# Patient Record
Sex: Female | Born: 1950
Health system: Southern US, Community
[De-identification: ages and names within clinical notes are randomized; demographics above are authoritative.]

## PROBLEM LIST (undated history)

## (undated) DIAGNOSIS — E785 Hyperlipidemia, unspecified: Secondary | ICD-10-CM

## (undated) DIAGNOSIS — D170 Benign lipomatous neoplasm of skin and subcutaneous tissue of head, face and neck: Secondary | ICD-10-CM

## (undated) DIAGNOSIS — K760 Fatty (change of) liver, not elsewhere classified: Secondary | ICD-10-CM

## (undated) DIAGNOSIS — E039 Hypothyroidism, unspecified: Secondary | ICD-10-CM

## (undated) DIAGNOSIS — K649 Unspecified hemorrhoids: Secondary | ICD-10-CM

## (undated) DIAGNOSIS — I10 Essential (primary) hypertension: Secondary | ICD-10-CM

## (undated) DIAGNOSIS — E119 Type 2 diabetes mellitus without complications: Secondary | ICD-10-CM

## (undated) HISTORY — PX: APPENDECTOMY: SHX54

## (undated) HISTORY — DX: Unspecified hemorrhoids: K64.9

## (undated) HISTORY — DX: Hypothyroidism, unspecified: E03.9

## (undated) HISTORY — DX: Benign lipomatous neoplasm of skin and subcutaneous tissue of head, face and neck: D17.0

## (undated) HISTORY — DX: Type 2 diabetes mellitus without complications: E11.9

## (undated) HISTORY — DX: Fatty (change of) liver, not elsewhere classified: K76.0

## (undated) HISTORY — DX: Hyperlipidemia, unspecified: E78.5

## (undated) HISTORY — PX: ABDOMINAL HYSTERECTOMY: SHX81

## (undated) HISTORY — PX: DILATION AND CURETTAGE OF UTERUS: SHX78

---

## 2001-02-13 ENCOUNTER — Other Ambulatory Visit: Admission: RE | Admit: 2001-02-13 | Discharge: 2001-02-13 | Payer: Self-pay | Admitting: Obstetrics and Gynecology

## 2004-04-05 ENCOUNTER — Ambulatory Visit (HOSPITAL_COMMUNITY): Admission: RE | Admit: 2004-04-05 | Discharge: 2004-04-05 | Payer: Self-pay | Admitting: Obstetrics and Gynecology

## 2005-05-07 ENCOUNTER — Ambulatory Visit (HOSPITAL_COMMUNITY): Admission: RE | Admit: 2005-05-07 | Discharge: 2005-05-07 | Payer: Self-pay | Admitting: Obstetrics and Gynecology

## 2005-05-30 ENCOUNTER — Ambulatory Visit (HOSPITAL_COMMUNITY): Admission: RE | Admit: 2005-05-30 | Discharge: 2005-05-30 | Payer: Self-pay | Admitting: Internal Medicine

## 2005-05-30 ENCOUNTER — Ambulatory Visit: Payer: Self-pay | Admitting: Internal Medicine

## 2005-05-30 HISTORY — PX: COLONOSCOPY: SHX174

## 2006-12-17 ENCOUNTER — Ambulatory Visit (HOSPITAL_COMMUNITY): Admission: RE | Admit: 2006-12-17 | Discharge: 2006-12-17 | Payer: Self-pay | Admitting: Family Medicine

## 2007-01-01 ENCOUNTER — Ambulatory Visit (HOSPITAL_COMMUNITY): Admission: RE | Admit: 2007-01-01 | Discharge: 2007-01-01 | Payer: Self-pay | Admitting: Family Medicine

## 2007-04-12 ENCOUNTER — Emergency Department (HOSPITAL_COMMUNITY): Admission: EM | Admit: 2007-04-12 | Discharge: 2007-04-12 | Payer: Self-pay | Admitting: Emergency Medicine

## 2007-06-24 ENCOUNTER — Ambulatory Visit (HOSPITAL_COMMUNITY): Admission: RE | Admit: 2007-06-24 | Discharge: 2007-06-24 | Payer: Self-pay | Admitting: Orthopaedic Surgery

## 2007-07-16 ENCOUNTER — Ambulatory Visit (HOSPITAL_COMMUNITY): Admission: RE | Admit: 2007-07-16 | Discharge: 2007-07-16 | Payer: Self-pay | Admitting: Family Medicine

## 2007-12-29 ENCOUNTER — Ambulatory Visit (HOSPITAL_COMMUNITY): Admission: RE | Admit: 2007-12-29 | Discharge: 2007-12-29 | Payer: Self-pay | Admitting: Family Medicine

## 2008-12-29 ENCOUNTER — Ambulatory Visit (HOSPITAL_COMMUNITY): Admission: RE | Admit: 2008-12-29 | Discharge: 2008-12-29 | Payer: Self-pay | Admitting: Family Medicine

## 2009-12-30 ENCOUNTER — Ambulatory Visit (HOSPITAL_COMMUNITY): Admission: RE | Admit: 2009-12-30 | Discharge: 2009-12-30 | Payer: Self-pay | Admitting: Obstetrics and Gynecology

## 2010-12-08 ENCOUNTER — Other Ambulatory Visit (HOSPITAL_COMMUNITY): Payer: Self-pay | Admitting: Family Medicine

## 2010-12-08 DIAGNOSIS — R748 Abnormal levels of other serum enzymes: Secondary | ICD-10-CM

## 2010-12-11 ENCOUNTER — Other Ambulatory Visit (HOSPITAL_COMMUNITY): Payer: Self-pay

## 2010-12-18 ENCOUNTER — Ambulatory Visit (HOSPITAL_COMMUNITY)
Admission: RE | Admit: 2010-12-18 | Discharge: 2010-12-18 | Disposition: A | Discharge status: Home / Self Care | Payer: Self-pay | Source: Ambulatory Visit | Attending: Family Medicine | Admitting: Family Medicine

## 2010-12-18 DIAGNOSIS — K769 Liver disease, unspecified: Secondary | ICD-10-CM | POA: Insufficient documentation

## 2010-12-18 DIAGNOSIS — R748 Abnormal levels of other serum enzymes: Secondary | ICD-10-CM | POA: Insufficient documentation

## 2011-01-09 ENCOUNTER — Other Ambulatory Visit (HOSPITAL_COMMUNITY): Payer: Self-pay | Admitting: Family Medicine

## 2011-01-09 DIAGNOSIS — Z139 Encounter for screening, unspecified: Secondary | ICD-10-CM

## 2011-01-15 ENCOUNTER — Ambulatory Visit (HOSPITAL_COMMUNITY)
Admission: RE | Admit: 2011-01-15 | Discharge: 2011-01-15 | Disposition: A | Discharge status: Home / Self Care | Payer: PRIVATE HEALTH INSURANCE | Source: Ambulatory Visit | Attending: Family Medicine | Admitting: Family Medicine

## 2011-01-15 DIAGNOSIS — Z139 Encounter for screening, unspecified: Secondary | ICD-10-CM

## 2011-01-15 DIAGNOSIS — Z1231 Encounter for screening mammogram for malignant neoplasm of breast: Secondary | ICD-10-CM | POA: Insufficient documentation

## 2011-02-27 NOTE — H&P (Signed)
NAME:  Nicole Lam, Nicole Lam               ACCOUNT NO.:  [id number]   MEDICAL RECORD NO.:  [id number]          PATIENT TYPE:  AMB   LOCATION:  DAY                           FACILITY:  [facility]   PHYSICIAN:  J. Sanjuana Kava, M.D. DATE OF BIRTH:  [birth year]   DATE OF ADMISSION:  DATE OF DISCHARGE:  LH                              HISTORY & PHYSICAL   I've got carpal tunnel.   HISTORY OF PRESENT ILLNESS:  The patient is a 60 year old female with  pain and tenderness in her hands.  She had pain and tenderness for  several years.  She had EMGs done in 2004 showing carpal tunnel  syndrome, moderate, on the left.  She was advised to have surgery at  that time but she declined.  She says it slowly got better, but now it  has gotten worse.  She is dropping things, having pain and tenderness in  her left hand.  Conservative treatment has been unsuccessful.  I  explained to her the risks and imponderables of open carpal tunnel  release.  She appears to understand and agrees to proceed.   PAST MEDICAL HISTORY:  Positive for ulcer disease.   ALLERGIES:  SHE HAS NO ALLERGIES.   MEDICATIONS:  1. Levothyroxine 75 mcg daily.  2. Estradiol 0.5 mg daily.  3. Alprazolam 0.25 mg as needed.  4. Vicodin as needed.  5. Multivitamins.   SOCIAL HISTORY:  She does not smoke.  She does not use alcoholic  beverages.   PRIMARY CARE PHYSICIAN:  Scott A. Wolfgang Phoenix, M.D.   PAST SURGICAL HISTORY:  1. Status post tubal ligation in 1975  2. Hysterectomy in 1993.   FAMILY HISTORY:  She denies any diseases that run in the family.   PHYSICAL EXAMINATION:  VITAL SIGNS:  Blood pressure 128/68, pulse 60,  respirations 16, afebrile.  4 feet 9-1/2 inches.  126 pounds.  GENERAL:  She is alert, cooperative, and oriented.  HEENT:  Negative.  NECK:  Supple.  LUNGS:  Clear to P&A.  HEART:  Regular rhythm without murmur heard.  ABDOMEN:  Soft, nontender, without masses.  EXTREMITIES:  She has a positive Phalen's and a positive  Tinel's on the  left wrist and weakly positive ones on the right.  Decreased sensation  in the median nerve distribution.  CNS:  Otherwise intact.   IMPRESSION:  Carpal tunnel syndrome on the left, mild carpal tunnel  syndrome on the right.   PLAN:  Open release of volar carpal ligament on the left.  Labs are  pending.                                            ______________________________  J. Sanjuana Kava, M.D.     JWK/MEDQ  D:  06/23/2007  T:  06/23/2007  Job:  [id number]

## 2011-02-27 NOTE — Op Note (Signed)
NAME:  Nicole Lam, Nicole Lam               ACCOUNT NO.:  [id number]   MEDICAL RECORD NO.:  [id number]          PATIENT TYPE:  AMB   LOCATION:  DAY                           FACILITY:  [facility]   PHYSICIAN:  J. Sanjuana Kava, M.D. DATE OF BIRTH:  06-23-1951   DATE OF PROCEDURE:  06/24/2007  DATE OF DISCHARGE:                               OPERATIVE REPORT   PREOPERATIVE DIAGNOSIS:  Carpal tunnel syndrome, left.   POSTOPERATIVE DIAGNOSIS:  Carpal tunnel syndrome, left.   OPERATION PERFORMED:  Open release of left carpal tunnel with incision  of volar carpal ligament, saline neurolysis, epineurotomy.   SURGEON:  J. Sanjuana Kava, M.D.   ANESTHESIA:  Bier block.   TOURNIQUET TIME:  Please refer to anesthesia record.   DRAINS:  None.   Volar plaster splint applied at end of procedure.   INDICATIONS FOR PROCEDURE:  The patient has had carpal tunnel syndrome  since 2004 which has progressively gotten worse on the left.  She is  dropping things.  Conservative treatment has not been successful.  She  desires to have surgery now.  She has positive EMGs in 2004.  Risks and  imponderables were discussed preoperatively.  She appears to understand  and agreed to procedure as outlined.   DESCRIPTION OF PROCEDURE:  The patient was seen in the holding area and  the left wrist was identified as the correct surgical site.  She placed  a mark on the left wrist.  I placed a mark on the left wrist.  She was  brought to the operating room and given Bier block anesthesia.  She was  prepped and draped in the usual manner.  Time out identified Ms.  Leino as the patient and left wrist as correct surgical site.  Careful dissection incision was made.  Median nerve was identified  proximally at the wrist.  A grooved director was placed in the carpel  tunnel space.  The volar carpal ligament was then incised.  It was tight  and there was obvious compression of the nerve.  Retinaculum was cut  proximally.  A  neurolysis epineurotomy carried out.  Wound was then  reapproximated using 3-0 nylon interrupted vertical mattress manner.  Sterile dressing applied, bulky dressing applied.  Sheet cotton applied.  Sheet  cotton cut dorsally.  Volar plaster splint applied.  Ace bandage applied  loosely.  The patient tolerated the procedure well. Given prescription  for Vicodin ES for pain.  I will see her in the office in approximately  10 days to two weeks.  She knows to contact me through the hospital or  the office if she has any difficulty.           ______________________________  Lenna Sciara. Sanjuana Kava, M.D.     JWK/MEDQ  D:  06/24/2007  T:  06/24/2007  Job:  [id number]

## 2011-03-02 NOTE — Op Note (Signed)
NAME:  Nicole Lam, Nicole Lam               ACCOUNT NO.:  [id number]   MEDICAL RECORD NO.:  [id number]          PATIENT TYPE:  AMB   LOCATION:  DAY                           FACILITY:  [facility]   PHYSICIAN:  R. Garfield Cornea, M.D. DATE OF BIRTH:  1951-05-18   DATE OF PROCEDURE:  05/30/2005  DATE OF DISCHARGE:                                 OPERATIVE REPORT   PROCEDURE PERFORMED:  Screening colonoscopy.   INDICATIONS FOR PROCEDURE:  The patient is a 60 year old lady devoid of any  lower GI tract symptoms referred by Dr. Wolfgang Phoenix for colorectal cancer  screening via colonoscopy.  She has never had her colon imaged.  She has no  symptoms and has no family history of colorectal neoplasia.  Colonoscopy is  now being done as a screening maneuver.  This approach has been discussed  with the patient at length.  Potential risks, benefits and alternatives have  been reviewed, questions have been answered, she is agreeable.  Please see  documentation in the medical records.   PROCEDURE NOTE:  Oxygen saturations, blood pressure, pulse and respirations  were monitored throughout the entire procedure.  Conscious sedation Versed 3  mg IV, Demerol 75 mg IV in divided doses.   INSTRUMENT USED:  Olympus video chip system.   FINDINGS:  Digital exam revealed no abnormalities.   ENDOSCOPIC FINDINGS:  Prep was adequate.   Rectum:  Examination of the rectal mucosa including retroflex view of the  anal verge revealed no abnormalities aside from a single anal papilla.  Colon:  Colonic mucosa was surveyed from the rectosigmoid junction to the  left, transverse and right colon to the area of the appendiceal orifice and  ileocecal valve and cecum.  These structures were well seen and photographed  for the record.  From this level, the scope was slowly withdrawn.  All  previously mentioned mucosal surfaces were again seen.  The colonic mucosa  appeared normal.  The patient tolerated the procedure well, was reacted in  endoscopy.   IMPRESSION:  1.  Single anal papilla, otherwise normal rectum.  2.  Normal colon.   RECOMMENDATIONS:  Repeat colonoscopy in 10 years.      Bridgette Habermann, M.D.  Electronically Signed     RMR/MEDQ  D:  05/30/2005  T:  05/30/2005  Job:  [id number]   cc:   Dr. Wolfgang Phoenix

## 2011-07-27 LAB — CBC
MCHC: 35
Platelets: 253

## 2011-07-27 LAB — COMPREHENSIVE METABOLIC PANEL
ALT: 25
AST: 21
Albumin: 3.4 — ABNORMAL LOW
Alkaline Phosphatase: 61
CO2: 26
Creatinine, Ser: 0.77
GFR calc non Af Amer: >60
Glucose, Bld: 228 — ABNORMAL HIGH
Potassium: 3.7
Total Bilirubin: 0.6

## 2011-07-27 LAB — URINALYSIS, ROUTINE W REFLEX MICROSCOPIC
Bilirubin Urine: NEGATIVE
Hgb urine dipstick: NEGATIVE
Ketones, ur: NEGATIVE
Nitrite: NEGATIVE
pH: 5

## 2011-07-27 LAB — DIFFERENTIAL
Basophils Absolute: 0
Eosinophils Absolute: 0.2
Eosinophils Relative: 5
Neutro Abs: 1.6 — ABNORMAL LOW
Neutrophils Relative %: 40 — ABNORMAL LOW

## 2011-12-04 ENCOUNTER — Encounter (HOSPITAL_COMMUNITY): Payer: Self-pay | Admitting: Dietician

## 2011-12-04 NOTE — Progress Notes (Signed)
[facility] Diabetes Class Completion  Date:December 04, 2011  Time: 10:00 AM  Pt attended [facility]'s Diabetes Class on December 04, 2011.   Patient was educated on the following topics: carbohydrate metabolism in relation to diabetes, sources of carbohydrate, carbohydrate counting, meal planning strategies, food label reading, and portion control.   Joaquim Lai, RD, LDN Date:November 30, 2011 Time: 10:00 AM

## 2012-02-12 ENCOUNTER — Other Ambulatory Visit: Payer: Self-pay | Admitting: Family Medicine

## 2012-02-12 ENCOUNTER — Other Ambulatory Visit (HOSPITAL_COMMUNITY): Payer: Self-pay | Admitting: Family Medicine

## 2012-02-12 ENCOUNTER — Other Ambulatory Visit: Payer: Self-pay | Admitting: Vascular Surgery

## 2012-02-12 DIAGNOSIS — Z139 Encounter for screening, unspecified: Secondary | ICD-10-CM

## 2012-02-18 ENCOUNTER — Ambulatory Visit (HOSPITAL_COMMUNITY)
Admission: RE | Admit: 2012-02-18 | Discharge: 2012-02-18 | Disposition: A | Discharge status: Home / Self Care | Payer: PRIVATE HEALTH INSURANCE | Source: Ambulatory Visit | Attending: Family Medicine | Admitting: Family Medicine

## 2012-02-18 DIAGNOSIS — Z139 Encounter for screening, unspecified: Secondary | ICD-10-CM

## 2012-02-18 DIAGNOSIS — Z1231 Encounter for screening mammogram for malignant neoplasm of breast: Secondary | ICD-10-CM | POA: Insufficient documentation

## 2013-02-11 ENCOUNTER — Other Ambulatory Visit: Payer: Self-pay | Admitting: Family Medicine

## 2013-03-23 ENCOUNTER — Encounter: Payer: Self-pay | Admitting: Family Medicine

## 2013-03-23 ENCOUNTER — Ambulatory Visit (INDEPENDENT_AMBULATORY_CARE_PROVIDER_SITE_OTHER): Payer: BC Managed Care – PPO | Admitting: Family Medicine

## 2013-03-23 ENCOUNTER — Other Ambulatory Visit: Payer: Self-pay | Admitting: Obstetrics and Gynecology

## 2013-03-23 VITALS — BP 119/70 | Temp 98.6°F | Wt 133.0 lb

## 2013-03-23 DIAGNOSIS — E039 Hypothyroidism, unspecified: Secondary | ICD-10-CM

## 2013-03-23 DIAGNOSIS — Z139 Encounter for screening, unspecified: Secondary | ICD-10-CM

## 2013-03-23 DIAGNOSIS — E785 Hyperlipidemia, unspecified: Secondary | ICD-10-CM | POA: Insufficient documentation

## 2013-03-23 DIAGNOSIS — M2550 Pain in unspecified joint: Secondary | ICD-10-CM

## 2013-03-23 MED ORDER — HYDROCODONE-ACETAMINOPHEN 7.5-325 MG PO TABS: ORAL_TABLET | ORAL | Status: DC

## 2013-03-23 MED ORDER — MELOXICAM 15 MG PO TABS: 15.0000 mg | ORAL_TABLET | Freq: Every day | ORAL | Status: DC

## 2013-03-23 MED ORDER — ALPRAZOLAM 0.25 MG PO TABS: 0.2500 mg | ORAL_TABLET | Freq: Three times a day (TID) | ORAL | Status: DC | PRN

## 2013-03-23 NOTE — Patient Instructions (Signed)
meloxicam 15 mg one daily  Hold naprosyn for now.  If no better within 2 weeks call us and we will set up with orthopedics

## 2013-03-23 NOTE — Progress Notes (Signed)
  Subjective:    Patient ID: Nicole Lam, female    DOB: Jul 05, 1951, 62 y.o.   MRN: [id number]  Knee Pain  The incident occurred more than 1 week ago. The incident occurred at work. The injury mechanism is unknown. The pain is present in the left knee. The quality of the pain is described as aching. The pain has been worsening since onset. She has tried elevation for the symptoms. The treatment provided mild relief.   gerd - using stor e brand Prevacid 15 mg daily   Review of Systems    she tried her cousins Voltaren gel and his RX med anti-inflammatory If ongoing referral to Dr. Brien Mates Objective:   Physical Exam Lungs are clear hearts regular pulse normal extremities no edema skin warm dry ligaments are stable in the left knee no swelling noted subjective discomfort in the left knee.       Assessment & Plan:  Left knee arthralgia-hydrocodone when necessary half tablet 6 hours when necessary not for frequent use also Mobic 15 mg daily if not doing better over the next 2 weeks notify us and we will set her up to see orthopedics. Other chronic health issues are stable continue current medications followup 4 months lab work.

## 2013-03-24 ENCOUNTER — Encounter: Payer: Self-pay | Admitting: *Deleted

## 2013-03-24 ENCOUNTER — Ambulatory Visit (HOSPITAL_COMMUNITY)
Admission: RE | Admit: 2013-03-24 | Discharge: 2013-03-24 | Disposition: A | Discharge status: Home / Self Care | Payer: BC Managed Care – PPO | Source: Ambulatory Visit | Attending: Obstetrics and Gynecology | Admitting: Obstetrics and Gynecology

## 2013-03-24 DIAGNOSIS — Z139 Encounter for screening, unspecified: Secondary | ICD-10-CM

## 2013-03-24 DIAGNOSIS — Z1231 Encounter for screening mammogram for malignant neoplasm of breast: Secondary | ICD-10-CM | POA: Insufficient documentation

## 2013-05-06 ENCOUNTER — Other Ambulatory Visit: Payer: Self-pay | Admitting: Obstetrics and Gynecology

## 2013-05-06 ENCOUNTER — Encounter: Payer: Self-pay | Admitting: Obstetrics and Gynecology

## 2013-05-06 ENCOUNTER — Ambulatory Visit (INDEPENDENT_AMBULATORY_CARE_PROVIDER_SITE_OTHER): Payer: BC Managed Care – PPO | Admitting: Obstetrics and Gynecology

## 2013-05-06 VITALS — BP 120/80 | Ht <= 58 in | Wt 130.0 lb

## 2013-05-06 DIAGNOSIS — Z1212 Encounter for screening for malignant neoplasm of rectum: Secondary | ICD-10-CM

## 2013-05-06 DIAGNOSIS — Z01419 Encounter for gynecological examination (general) (routine) without abnormal findings: Secondary | ICD-10-CM

## 2013-05-06 LAB — HEMOCCULT GUIAC POC 1CARD (OFFICE)

## 2013-05-06 NOTE — Progress Notes (Signed)
  Assessment:  Normal Gyn Exam   Plan:   1. return annually or prn Subjective:  Nicole Lam is a 62 y.o. female No obstetric history on file. who presents for annual exam. She is s/p hyst for benign disease, s/p tahbso The patient has complaints today of none Sees Dr Wolfgang Phoenix now , had knee pain 6/14.   The following portions of the patient's history were reviewed and updated as appropriate: allergies, current medications, past family history, past medical history, past social history, past surgical history and problem list.  Review of Systems Pertinent items are noted in HPI.  Objective:  BP 120/80  Ht 4' 8"  (1.422 m)  Wt 130 lb (58.968 kg)  BMI 29.16 kg/m2  BMI: Body mass index is 29.16 kg/(m^2). General Appearance: Alert, appropriate appearance for age. No acute distress HEENT: Grossly normal Neck / Thyroid:  Cardiovascular: RRR; normal S1, S2, no murmur Lungs: CTA bilaterally Back: No CVAT Breast Exam: No dimpling, nipple retraction or discharge. No masses or nodes., Normal to inspection and Normal breast tissue bilaterally Gastrointestinal: Soft, non-tender, no masses or organomegaly Pelvic Exam: External genitalia: normal general appearance Urinary system: urethral meatus normal Vaginal: normal mucosa without prolapse or lesions Cervix: absent Adnexa: normal bimanual exam Uterus: absent Rectal: good sphincter tone, no masses and guaiac negative Rectovaginal: normal rectal, no masses and guaiac negative stool obtained Lymphatic Exam: Non-palpable nodes in neck, clavicular, axillary, or inguinal regions Skin: no rash or abnormalities Neurologic: Normal gait and speech, no tremor  Psychiatric: Alert and oriented, appropriate affect.  Urinalysis:Not done  Mallory Shirk. MD Pgr [phone] 2:04 PM

## 2013-05-06 NOTE — Patient Instructions (Signed)
Follow here q 3 yrs for anatomy support. Get Dr Wolfgang Phoenix to do breast exam and rectal if you do not come annually to our office.

## 2013-05-18 ENCOUNTER — Other Ambulatory Visit: Payer: Self-pay | Admitting: *Deleted

## 2013-05-18 MED ORDER — SIMVASTATIN 20 MG PO TABS: 20.0000 mg | ORAL_TABLET | Freq: Every evening | ORAL | Status: DC

## 2013-06-26 ENCOUNTER — Other Ambulatory Visit: Payer: Self-pay | Admitting: Family Medicine

## 2013-08-17 ENCOUNTER — Other Ambulatory Visit: Payer: Self-pay | Admitting: Family Medicine

## 2013-08-17 NOTE — Telephone Encounter (Signed)
Last office visit 03/23/13

## 2013-09-16 ENCOUNTER — Other Ambulatory Visit: Payer: Self-pay | Admitting: *Deleted

## 2013-09-16 MED ORDER — SIMVASTATIN 20 MG PO TABS: 20.0000 mg | ORAL_TABLET | Freq: Every evening | ORAL | Status: DC

## 2013-09-21 ENCOUNTER — Other Ambulatory Visit: Payer: Self-pay | Admitting: *Deleted

## 2013-09-21 MED ORDER — LEVOTHYROXINE SODIUM 75 MCG PO TABS: 75.0000 ug | ORAL_TABLET | Freq: Every day | ORAL | Status: DC

## 2013-10-16 ENCOUNTER — Ambulatory Visit (INDEPENDENT_AMBULATORY_CARE_PROVIDER_SITE_OTHER): Payer: BC Managed Care – PPO | Admitting: Nurse Practitioner

## 2013-10-16 ENCOUNTER — Encounter: Payer: Self-pay | Admitting: Nurse Practitioner

## 2013-10-16 VITALS — BP 122/70 | Ht 59.0 in | Wt 129.4 lb

## 2013-10-16 DIAGNOSIS — R5383 Other fatigue: Secondary | ICD-10-CM

## 2013-10-16 DIAGNOSIS — Z79899 Other long term (current) drug therapy: Secondary | ICD-10-CM

## 2013-10-16 DIAGNOSIS — E119 Type 2 diabetes mellitus without complications: Secondary | ICD-10-CM

## 2013-10-16 DIAGNOSIS — E785 Hyperlipidemia, unspecified: Secondary | ICD-10-CM

## 2013-10-16 DIAGNOSIS — R5381 Other malaise: Secondary | ICD-10-CM

## 2013-10-16 DIAGNOSIS — G629 Polyneuropathy, unspecified: Secondary | ICD-10-CM

## 2013-10-16 DIAGNOSIS — G609 Hereditary and idiopathic neuropathy, unspecified: Secondary | ICD-10-CM

## 2013-10-16 DIAGNOSIS — E039 Hypothyroidism, unspecified: Secondary | ICD-10-CM

## 2013-10-16 LAB — POCT GLYCOSYLATED HEMOGLOBIN (HGB A1C): Hemoglobin A1C: 6

## 2013-10-16 MED ORDER — METFORMIN HCL 500 MG PO TABS: 500.0000 mg | ORAL_TABLET | Freq: Two times a day (BID) | ORAL | Status: DC

## 2013-10-16 MED ORDER — SIMVASTATIN 20 MG PO TABS: 20.0000 mg | ORAL_TABLET | Freq: Every evening | ORAL | Status: DC

## 2013-10-16 MED ORDER — HYDROCODONE-ACETAMINOPHEN 7.5-325 MG PO TABS: ORAL_TABLET | ORAL | Status: DC

## 2013-10-16 MED ORDER — LEVOTHYROXINE SODIUM 75 MCG PO TABS: 75.0000 ug | ORAL_TABLET | Freq: Every day | ORAL | Status: DC

## 2013-10-16 MED ORDER — AMITRIPTYLINE HCL 10 MG PO TABS: 10.0000 mg | ORAL_TABLET | Freq: Every day | ORAL | Status: DC

## 2013-10-17 LAB — LIPID PANEL
Cholesterol: 162 mg/dL (ref 0–200)
HDL: 42 mg/dL (ref >39)
LDL Cholesterol: 83 mg/dL (ref 0–99)
Total CHOL/HDL Ratio: 3.9 Ratio
Triglycerides: 186 mg/dL — ABNORMAL HIGH (ref <150)
VLDL: 37 mg/dL (ref 0–40)

## 2013-10-17 LAB — HEPATIC FUNCTION PANEL
ALBUMIN: 4.4 g/dL (ref 3.5–5.2)
ALK PHOS: 60 U/L (ref 39–117)
ALT: 27 U/L (ref 0–35)
AST: 20 U/L (ref 0–37)
Bilirubin, Direct: 0.1 mg/dL (ref 0.0–0.3)
Indirect Bilirubin: 0.4 mg/dL (ref 0.0–0.9)
TOTAL PROTEIN: 7 g/dL (ref 6.0–8.3)
Total Bilirubin: 0.5 mg/dL (ref 0.3–1.2)

## 2013-10-17 LAB — BASIC METABOLIC PANEL
BUN: 15 mg/dL (ref 6–23)
CALCIUM: 9.5 mg/dL (ref 8.4–10.5)
CHLORIDE: 100 meq/L (ref 96–112)
CO2: 32 meq/L (ref 19–32)
CREATININE: 0.77 mg/dL (ref 0.50–1.10)
GLUCOSE: 108 mg/dL — AB (ref 70–99)
Potassium: 4.4 mEq/L (ref 3.5–5.3)
SODIUM: 139 meq/L (ref 135–145)

## 2013-10-18 LAB — FERRITIN: FERRITIN: 43 ng/mL (ref 10–291)

## 2013-10-18 LAB — MICROALBUMIN, URINE: MICROALB UR: 0.5 mg/dL (ref 0.00–1.89)

## 2013-10-18 LAB — VITAMIN B12: VITAMIN B 12: 608 pg/mL (ref 211–911)

## 2013-10-18 LAB — TSH: TSH: 5.677 u[IU]/mL — ABNORMAL HIGH (ref 0.350–4.500)

## 2013-10-20 ENCOUNTER — Encounter: Payer: Self-pay | Admitting: Nurse Practitioner

## 2013-10-20 DIAGNOSIS — G629 Polyneuropathy, unspecified: Secondary | ICD-10-CM | POA: Insufficient documentation

## 2013-10-20 LAB — VITAMIN D 25 HYDROXY (VIT D DEFICIENCY, FRACTURES): VIT D 25 HYDROXY: 48 ng/mL (ref 30–89)

## 2013-10-20 NOTE — Assessment & Plan Note (Signed)
Lab work pending. Medications  . simvastatin (ZOCOR) 20 MG tablet    Sig: Take 1 tablet (20 mg total) by mouth every evening.    Dispense:  90 tablet    Refill:  1    Order Specific Question:  Supervising Provider    Answer:  Mikey Kirschner [[id number]]  . levothyroxine (SYNTHROID, LEVOTHROID) 75 MCG tablet    Sig: Take 1 tablet (75 mcg total) by mouth daily before breakfast.    Dispense:  90 tablet    Refill:  1    Order Specific Question:  Supervising Provider    Answer:  Mikey Kirschner [[id number]]  . metFORMIN (GLUCOPHAGE) 500 MG tablet    Sig: Take 1 tablet (500 mg total) by mouth 2 (two) times daily with a meal. Take 2 in am , 1 at evening meal    Dispense:  270 tablet    Refill:  1    Order Specific Question:  Supervising Provider    Answer:  Mikey Kirschner [[id number]]  . DISCONTD: HYDROcodone-acetaminophen ([place]) 7.5-325 MG per tablet    Sig: 1/2 to 1  bid prn    Dispense:  30 tablet    Refill:  0    Order Specific Question:  Supervising Provider    Answer:  Mikey Kirschner [[id number]]  . DISCONTD: HYDROcodone-acetaminophen ([place]) 7.5-325 MG per tablet    Sig: 1/2 to 1  bid prn    Dispense:  30 tablet    Refill:  0    May fill 30 days from 10/16/13    Order Specific Question:  Supervising Provider    Answer:  Mikey Kirschner [[id number]]  . HYDROcodone-acetaminophen ([place]) 7.5-325 MG per tablet    Sig: 1/2 to 1  bid prn    Dispense:  30 tablet    Refill:  0    May fill 60 days from 10/16/13    Order Specific Question:  Supervising Provider    Answer:  Mikey Kirschner [[id number]]  . amitriptyline (ELAVIL) 10 MG tablet    Sig: Take 1 tablet (10 mg total) by mouth at bedtime. For foot pain    Dispense:  30 tablet    Refill:  2    Order Specific Question:  Supervising Provider    Answer:  Mikey Kirschner [[id number]]   Start Elavil 10 mg at nighttime for foot pain. Lab work pending. Discussed diabetic foot care. Recheck in 3 months, call back sooner if any problems.

## 2013-10-20 NOTE — Assessment & Plan Note (Signed)
TSH pending.

## 2013-10-20 NOTE — Assessment & Plan Note (Signed)
Ending. Medications  . simvastatin (ZOCOR) 20 MG tablet    Sig: Take 1 tablet (20 mg total) by mouth every evening.    Dispense:  90 tablet    Refill:  1    Order Specific Question:  Supervising Provider    Answer:  Mikey Kirschner [[id number]]  . levothyroxine (SYNTHROID, LEVOTHROID) 75 MCG tablet    Sig: Take 1 tablet (75 mcg total) by mouth daily before breakfast.    Dispense:  90 tablet    Refill:  1    Order Specific Question:  Supervising Provider    Answer:  Mikey Kirschner [[id number]]  . metFORMIN (GLUCOPHAGE) 500 MG tablet    Sig: Take 1 tablet (500 mg total) by mouth 2 (two) times daily with a meal. Take 2 in am , 1 at evening meal    Dispense:  270 tablet    Refill:  1    Order Specific Question:  Supervising Provider    Answer:  Mikey Kirschner [[id number]]  . DISCONTD: HYDROcodone-acetaminophen ([place]) 7.5-325 MG per tablet    Sig: 1/2 to 1  bid prn    Dispense:  30 tablet    Refill:  0    Order Specific Question:  Supervising Provider    Answer:  Mikey Kirschner [[id number]]  . DISCONTD: HYDROcodone-acetaminophen ([place]) 7.5-325 MG per tablet    Sig: 1/2 to 1  bid prn    Dispense:  30 tablet    Refill:  0    May fill 30 days from 10/16/13    Order Specific Question:  Supervising Provider    Answer:  Mikey Kirschner [[id number]]  . HYDROcodone-acetaminophen (NORCO) 7.5-325 MG per tablet    Sig: 1/2 to 1  bid prn    Dispense:  30 tablet    Refill:  0    May fill 60 days from 10/16/13    Order Specific Question:  Supervising Provider    Answer:  Mikey Kirschner [[id number]]  . amitriptyline (ELAVIL) 10 MG tablet    Sig: Take 1 tablet (10 mg total) by mouth at bedtime. For foot pain    Dispense:  30 tablet    Refill:  2    Order Specific Question:  Supervising Provider    Answer:  Mikey Kirschner [[id number]]   Start Elavil 10 mg at nighttime for foot pain. Lab work pending. Discussed diabetic foot care. Recheck in 3 months, call back sooner if any problems.

## 2013-10-20 NOTE — Progress Notes (Signed)
Subjective:  Presents complaints of bilateral foot pain mainly on the soles of both feet for more than 6 months. Minimal relief with meloxicam. Describes as a burning/tingling pain. Does not check her blood sugars at home. Takes occasional hydrocodone for pain. Compliant with medications see list. Currently on levothyroxine 75 mcg a half a pill every day but Saturday Monday and Wednesday. No chest pain shortness of breath or edema.  Objective:   BP 122/70  Ht 4' 11"  (1.499 m)  Wt 129 lb 6.4 oz (58.695 kg)  BMI 26.12 kg/m2 NAD. Alert, oriented. Thyroid normal to palpation and nontender. Lungs clear. Heart regular rate rhythm. Feet no edema, strong DP pulse, toes warm with good capillary refill. Skin dry but intact. Monofilament test normal. Hemoglobin A1c 6.0.  Assessment:Diabetes - Plan: POCT glycosylated hemoglobin (Hb H9Q), Basic metabolic panel, Microalbumin, urine  Peripheral neuropathy - Plan: Vitamin B12, Ferritin  Hypothyroidism - Plan: TSH  Hyperlipidemia - Plan: Lipid panel  High risk medication use - Plan: Hepatic function panel  Other malaise and fatigue - Plan: Vit D  25 hydroxy (rtn osteoporosis monitoring)  Plan: Meds ordered this encounter  Medications  . simvastatin (ZOCOR) 20 MG tablet    Sig: Take 1 tablet (20 mg total) by mouth every evening.    Dispense:  90 tablet    Refill:  1    Order Specific Question:  Supervising Provider    Answer:  Mikey Kirschner [[id number]]  . levothyroxine (SYNTHROID, LEVOTHROID) 75 MCG tablet    Sig: Take 1 tablet (75 mcg total) by mouth daily before breakfast.    Dispense:  90 tablet    Refill:  1    Order Specific Question:  Supervising Provider    Answer:  Mikey Kirschner [[id number]]  . metFORMIN (GLUCOPHAGE) 500 MG tablet    Sig: Take 1 tablet (500 mg total) by mouth 2 (two) times daily with a meal. Take 2 in am , 1 at evening meal    Dispense:  270 tablet    Refill:  1    Order Specific Question:  Supervising Provider    Answer:   Mikey Kirschner [[id number]]  . DISCONTD: HYDROcodone-acetaminophen ([place]) 7.5-325 MG per tablet    Sig: 1/2 to 1  bid prn    Dispense:  30 tablet    Refill:  0    Order Specific Question:  Supervising Provider    Answer:  Mikey Kirschner [[id number]]  . DISCONTD: HYDROcodone-acetaminophen ([place]) 7.5-325 MG per tablet    Sig: 1/2 to 1  bid prn    Dispense:  30 tablet    Refill:  0    May fill 30 days from 10/16/13    Order Specific Question:  Supervising Provider    Answer:  Mikey Kirschner [[id number]]  . HYDROcodone-acetaminophen (NORCO) 7.5-325 MG per tablet    Sig: 1/2 to 1  bid prn    Dispense:  30 tablet    Refill:  0    May fill 60 days from 10/16/13    Order Specific Question:  Supervising Provider    Answer:  Mikey Kirschner [[id number]]  . amitriptyline (ELAVIL) 10 MG tablet    Sig: Take 1 tablet (10 mg total) by mouth at bedtime. For foot pain    Dispense:  30 tablet    Refill:  2    Order Specific Question:  Supervising Provider    Answer:  Mikey Kirschner [[id number]]   Start Elavil 10 mg at  nighttime for foot pain. Lab work pending. Discussed diabetic foot care. Recheck in 3 months, call back sooner if any problems.

## 2013-10-30 NOTE — Progress Notes (Signed)
[facility] 10/30/13

## 2013-11-30 ENCOUNTER — Other Ambulatory Visit: Payer: Self-pay | Admitting: *Deleted

## 2013-12-02 MED ORDER — ESTRADIOL 0.5 MG PO TABS: 0.5000 mg | ORAL_TABLET | Freq: Every day | ORAL | Status: DC

## 2013-12-24 ENCOUNTER — Other Ambulatory Visit: Payer: Self-pay | Admitting: *Deleted

## 2013-12-29 ENCOUNTER — Other Ambulatory Visit: Payer: Self-pay | Admitting: *Deleted

## 2013-12-29 MED ORDER — LISINOPRIL 2.5 MG PO TABS: 2.5000 mg | ORAL_TABLET | Freq: Every day | ORAL | Status: DC

## 2014-01-04 ENCOUNTER — Telehealth: Payer: Self-pay | Admitting: Family Medicine

## 2014-01-04 DIAGNOSIS — E039 Hypothyroidism, unspecified: Secondary | ICD-10-CM

## 2014-01-04 NOTE — Telephone Encounter (Signed)
Pt last seen 10/16/13. bloodwork done on 10/30/13 for lipid, liver, bmp,A1C, vit B12, ferritin, microalb urine, TSH, and Vit D. States she went to lab Saturday and was suppose to have orders for bloodwork.

## 2014-01-04 NOTE — Telephone Encounter (Signed)
Patient was told at last appointment to go over to [facility] to have labs drawn, but he went there over the weekend and they did not have orders. Please advise.

## 2014-01-04 NOTE — Telephone Encounter (Signed)
[facility]. Bloodwork ordered in the system.

## 2014-01-04 NOTE — Telephone Encounter (Signed)
TSH 

## 2014-01-05 NOTE — Telephone Encounter (Signed)
Pt notified bloodwork orders are ready.

## 2014-01-16 LAB — TSH: TSH: 0.147 u[IU]/mL — ABNORMAL LOW (ref 0.350–4.500)

## 2014-01-19 ENCOUNTER — Encounter: Payer: Self-pay | Admitting: *Deleted

## 2014-02-12 ENCOUNTER — Other Ambulatory Visit: Payer: Self-pay | Admitting: *Deleted

## 2014-02-12 ENCOUNTER — Telehealth: Payer: Self-pay | Admitting: Family Medicine

## 2014-02-12 DIAGNOSIS — R5383 Other fatigue: Principal | ICD-10-CM

## 2014-02-12 DIAGNOSIS — R5381 Other malaise: Secondary | ICD-10-CM

## 2014-02-12 NOTE — Telephone Encounter (Signed)
Take whole tab today, and just half on mondays, let her know if thyr too strong for too long can cause serious health issues like thin bones. Re ck tsh in a mo

## 2014-02-12 NOTE — Telephone Encounter (Signed)
Patient's husband notified and verbalized understanding

## 2014-02-12 NOTE — Telephone Encounter (Signed)
Patient says that ever since her thyroid med was changed, she has felt very sluggish. Please advise.

## 2014-02-12 NOTE — Telephone Encounter (Signed)
Last TSH was 0.147 on 01/16/14. Pt was taking 52mg qd but was changed to  1/2 tablet on mon and fri and one tablet all other days.

## 2014-03-19 ENCOUNTER — Other Ambulatory Visit: Payer: Self-pay | Admitting: Obstetrics and Gynecology

## 2014-03-19 DIAGNOSIS — Z1231 Encounter for screening mammogram for malignant neoplasm of breast: Secondary | ICD-10-CM

## 2014-03-20 LAB — TSH: TSH: 0.54 u[IU]/mL (ref 0.350–4.500)

## 2014-03-26 ENCOUNTER — Ambulatory Visit (HOSPITAL_COMMUNITY)
Admission: RE | Admit: 2014-03-26 | Discharge: 2014-03-26 | Disposition: A | Discharge status: Home / Self Care | Payer: BC Managed Care – PPO | Source: Ambulatory Visit | Attending: Obstetrics and Gynecology | Admitting: Obstetrics and Gynecology

## 2014-03-26 DIAGNOSIS — Z1231 Encounter for screening mammogram for malignant neoplasm of breast: Secondary | ICD-10-CM | POA: Insufficient documentation

## 2014-03-30 ENCOUNTER — Ambulatory Visit (INDEPENDENT_AMBULATORY_CARE_PROVIDER_SITE_OTHER): Payer: BC Managed Care – PPO | Admitting: Family Medicine

## 2014-03-30 ENCOUNTER — Encounter: Payer: Self-pay | Admitting: Family Medicine

## 2014-03-30 VITALS — BP 128/82 | Ht 59.0 in | Wt 130.2 lb

## 2014-03-30 DIAGNOSIS — R7303 Prediabetes: Secondary | ICD-10-CM | POA: Insufficient documentation

## 2014-03-30 DIAGNOSIS — E039 Hypothyroidism, unspecified: Secondary | ICD-10-CM

## 2014-03-30 DIAGNOSIS — K219 Gastro-esophageal reflux disease without esophagitis: Secondary | ICD-10-CM

## 2014-03-30 DIAGNOSIS — E119 Type 2 diabetes mellitus without complications: Secondary | ICD-10-CM

## 2014-03-30 DIAGNOSIS — R7309 Other abnormal glucose: Secondary | ICD-10-CM

## 2014-03-30 DIAGNOSIS — M2550 Pain in unspecified joint: Secondary | ICD-10-CM

## 2014-03-30 DIAGNOSIS — G8929 Other chronic pain: Secondary | ICD-10-CM

## 2014-03-30 DIAGNOSIS — M549 Dorsalgia, unspecified: Secondary | ICD-10-CM

## 2014-03-30 LAB — POCT GLYCOSYLATED HEMOGLOBIN (HGB A1C): HEMOGLOBIN A1C: 6.2

## 2014-03-30 MED ORDER — MELOXICAM 15 MG PO TABS: ORAL_TABLET | ORAL | Status: DC

## 2014-03-30 MED ORDER — ALPRAZOLAM 0.25 MG PO TABS: 0.2500 mg | ORAL_TABLET | Freq: Three times a day (TID) | ORAL | Status: DC | PRN

## 2014-03-30 MED ORDER — HYDROCODONE-ACETAMINOPHEN 7.5-325 MG PO TABS: ORAL_TABLET | ORAL | Status: DC

## 2014-03-30 MED ORDER — LEVOTHYROXINE SODIUM 75 MCG PO TABS: 75.0000 ug | ORAL_TABLET | Freq: Every day | ORAL | Status: DC

## 2014-03-30 NOTE — Progress Notes (Signed)
   Subjective:    Patient ID: Nicole Lam, female    DOB: 24-Dec-1950, 63 y.o.   MRN: [id number]  Diabetes She presents for her follow-up diabetic visit. Risk factors for coronary artery disease include diabetes mellitus, dyslipidemia, hypertension and post-menopausal. Current diabetic treatment includes oral agent (monotherapy). She is compliant with treatment all of the time.   Patient also reports left hip and shoulder pain.   Review of Systems     Objective:   Physical Exam        Assessment & Plan:

## 2014-03-30 NOTE — Progress Notes (Signed)
   Subjective:    Patient ID: Nicole Lam, female    DOB: 05/23/1951, 63 y.o.   MRN: [id number]  HPI Having some joint pain in right hand and left shoulder, does massage therapy Works full time Trying to eat healthy  This patient was seen today for chronic pain  The medication list was reviewed and updated.   -Compliance with pain medication: good  The patient was advised the importance of maintaining medication and not using illegal substances with these.  Refills needed: yes  The patient was educated that we can provide 3 monthly scripts for their medication, it is their responsibility to follow the instructions.  Side effects or complications from medications: none  Patient is aware that pain medications are meant to minimize the severity of the pain to allow their pain levels to improve to allow for better function. They are aware of that pain medications cannot totally remove their pain.  Due for UDT ( at least once per year) : later this year  Taking thyroid medicine             Review of Systems  Constitutional: Negative for activity change, appetite change and fatigue.  HENT: Negative for congestion.   Respiratory: Negative for cough and shortness of breath.   Cardiovascular: Negative for chest pain.  Gastrointestinal: Negative for abdominal pain.  Endocrine: Negative for polydipsia and polyphagia.  Genitourinary: Negative for frequency.  Musculoskeletal: Positive for arthralgias and back pain.  Neurological: Negative for weakness.  Psychiatric/Behavioral: Negative for confusion.       Objective:   Physical Exam  Neck no masses lungs are clear no crackles heart is regular pulse normal foot exam normal extremities no edema skin warm dry blood pressure good      Assessment & Plan:  1. Type II or unspecified type diabetes mellitus without mention of complication, not stated as uncontrolled Patient's A1c actually looks improved continue medication  watch diet closely. - POCT glycosylated hemoglobin (Hb A1C)  2. Arthralgia She uses meloxicam she may continue using this if it irritates her stomach stopped taking  3. Hypothyroidism Continue thyroid medicine check lab work on next visit  4. Prediabetes A1c good noted above  5. GERD (gastroesophageal reflux disease) She uses OTC measures she will consider Prevacid 30 mg for use she will let us know if she needs a prescription  6. Chronic back pain She uses hydrocodone sparingly scripts were given to her  Refill on her Xanax was given she uses sparingly 25 minutes spent with patient greater than half in discussion of all her issues

## 2014-04-15 ENCOUNTER — Other Ambulatory Visit: Payer: Self-pay | Admitting: Nurse Practitioner

## 2014-04-20 ENCOUNTER — Telehealth: Payer: Self-pay | Admitting: Family Medicine

## 2014-04-20 MED ORDER — PANTOPRAZOLE SODIUM 40 MG PO TBEC
40.0000 mg | DELAYED_RELEASE_TABLET | Freq: Every day | ORAL | Status: DC
Start: 2014-04-20 — End: 2014-06-25

## 2014-04-20 NOTE — Addendum Note (Signed)
Addended by: Dairl Ponder on: 04/20/2014 03:30 PM   Modules accepted: Orders

## 2014-04-20 NOTE — Telephone Encounter (Signed)
Are couple different options that could be done. Most people will use generic protonix 40 mg, 1 daily. This is covered by [employer]. Another option is omeprazole many insurance companies do not cover that because it's also available over-the-counter. My suggestion would be protonix 40 mg, #30, 6 refills . She can always call her insurance companies and discuss with them what medications are covered at the best price if she would like then get back with [place]

## 2014-04-20 NOTE — Telephone Encounter (Signed)
Patient needs prescription for generic brand of prevacid 31m because buying it over the counter is expensive.Can you find something else cheaper. [facility]

## 2014-04-20 NOTE — Telephone Encounter (Signed)
Rx sent electronically to pharmacy. Patient notified. 

## 2014-05-08 ENCOUNTER — Other Ambulatory Visit: Payer: Self-pay | Admitting: Nurse Practitioner

## 2014-05-10 ENCOUNTER — Other Ambulatory Visit: Payer: BC Managed Care – PPO | Admitting: Obstetrics and Gynecology

## 2014-05-24 ENCOUNTER — Ambulatory Visit (INDEPENDENT_AMBULATORY_CARE_PROVIDER_SITE_OTHER): Payer: BC Managed Care – PPO | Admitting: Obstetrics and Gynecology

## 2014-05-24 ENCOUNTER — Encounter: Payer: Self-pay | Admitting: Obstetrics and Gynecology

## 2014-05-24 VITALS — BP 118/72 | Ht 59.0 in | Wt 130.0 lb

## 2014-05-24 DIAGNOSIS — Z01419 Encounter for gynecological examination (general) (routine) without abnormal findings: Secondary | ICD-10-CM | POA: Insufficient documentation

## 2014-05-24 NOTE — Progress Notes (Signed)
Patient ID: Nicole Lam, female   DOB: [birth year], 64 y.o.   MRN: [id number]  This chart was scribed by Erling Conte, Medical Scribe, for Dr. Mallory Shirk on 05/24/14 at 3:43 PM. This chart was reviewed by Dr. Mallory Shirk for accuracy.    Assessment:  Annual Gyn Exam  Right breast tissue slight increased sensitivity on right Plan:  1. pap smear done, next pap due 1 year 2. return annually or prn 3    Annual mammogram advised Subjective:  Nicole Lam is a 63 y.o. female No obstetric history on file. who presents for annual exam. No LMP recorded. Patient has had a hysterectomy. The patient has no complaints today. Patient had a mammogram on 03/19/14 with no acute findings of malignancy. Result reviewed.  The following portions of the patient's history were reviewed and updated as appropriate: allergies, current medications, past family history, past medical history, past social history, past surgical history and problem list. Past Medical History  Diagnosis Date  . Lipoma of neck   . Hypothyroidism   . Hyperlipidemia   . Fatty liver     Past Surgical History  Procedure Laterality Date  . Dilation and curettage of uterus    . Abdominal hysterectomy    . Colonoscopy      Current outpatient prescriptions:ALPRAZolam (XANAX) 0.25 MG tablet, Take 1 tablet (0.25 mg total) by mouth 3 (three) times daily as needed for anxiety or sleep., Disp: 30 tablet, Rfl: 1;  aspirin 81 MG tablet, Take 81 mg by mouth 2 (two) times daily., Disp: , Rfl: ;  beta carotene w/minerals (OCUVITE) tablet, Take 1 tablet by mouth daily., Disp: , Rfl:  docusate sodium (COLACE) 100 MG capsule, Take 100 mg by mouth 2 (two) times daily., Disp: , Rfl: ;  estradiol (ESTRACE) 0.5 MG tablet, Take 1 tablet (0.5 mg total) by mouth daily., Disp: 30 tablet, Rfl: 11;  fish oil-omega-3 fatty acids 1000 MG capsule, Take 2 g by mouth daily., Disp: , Rfl: ;  HYDROcodone-acetaminophen (NORCO) 7.5-325 MG per tablet, 1/2 to 1  bid  prn, Disp: 30 tablet, Rfl: 0 levothyroxine (SYNTHROID, LEVOTHROID) 75 MCG tablet, Take 1 tablet (75 mcg total) by mouth daily before breakfast., Disp: 90 tablet, Rfl: 1;  lisinopril (PRINIVIL,ZESTRIL) 2.5 MG tablet, Take 1 tablet (2.5 mg total) by mouth daily., Disp: 30 tablet, Rfl: 5;  loratadine (CLARITIN) 10 MG tablet, Take 10 mg by mouth daily., Disp: , Rfl: ;  meloxicam (MOBIC) 15 MG tablet, TAKE ONE TABLET BY MOUTH ONCE DAILY, Disp: 30 tablet, Rfl: 6 metFORMIN (GLUCOPHAGE) 500 MG tablet, TAKE TWO TABLETS BY MOUTH IN THE MORNING AND ONE IN THE EVENING WITH MEALS, Disp: 270 tablet, Rfl: 1;  milk thistle 175 MG tablet, Take 175 mg by mouth daily. Take QD, Disp: , Rfl: ;  Multiple Vitamins-Minerals (MULTIVITAMIN WITH MINERALS) tablet, Take 1 tablet by mouth daily., Disp: , Rfl: ;  pantoprazole (PROTONIX) 40 MG tablet, Take 1 tablet (40 mg total) by mouth daily., Disp: 30 tablet, Rfl: 1 pyridoxine (B-6) 100 MG tablet, Take 100 mg by mouth daily. 2 in the am, and 2 in the evening, Disp: , Rfl: ;  simvastatin (ZOCOR) 20 MG tablet, TAKE ONE TABLET BY MOUTH IN THE EVENING, Disp: 90 tablet, Rfl: 1;  triamcinolone cream (KENALOG) 0.1 %, Apply topically 2 (two) times daily., Disp: , Rfl:   Review of Systems No complaints  Objective:  BP 118/72  Ht 4' 11"  (1.499 m)  Wt 58.968 kg (130  lb)  BMI 26.24 kg/m2   BMI: Body mass index is 26.24 kg/(m^2).  General Appearance: Alert, appropriate appearance for age. No acute distress HEENT: Grossly normal Neck / Thyroid:  Cardiovascular: RRR; normal S1, S2, no murmur Lungs: CTA bilaterally Back: No CVAT Breast Exam: No dimpling, nipple retraction or discharge. She has slight enlargement in RUOQ in area of glandular tissue. Mobile slightly tender 8-10 mm diameter irregularity.Axilla negative.  Reviewed and had pt do BSE of the area.  Gastrointestinal: Soft, non-tender, no masses or organomegaly. Hemoccult- negative Pelvic Exam: Vulva and vagina appear normal. Good  vaginal support Cervix: absent Adnexa: absent, bilateral Uterus: absent Rectovaginal: normal. Good support Lymphatic Exam: Non-palpable nodes in neck, clavicular, axillary, or inguinal regions  Skin: no rash or abnormalities Neurologic: Normal gait and speech, no tremor  Psychiatric: Alert and oriented, appropriate affect.  Urinalysis:Not done  Chaperone present for exam which was performed with pt's permission  Mallory Shirk. MD Pgr [phone] 3:42 PM

## 2014-06-25 ENCOUNTER — Other Ambulatory Visit: Payer: Self-pay | Admitting: *Deleted

## 2014-06-25 MED ORDER — PANTOPRAZOLE SODIUM 40 MG PO TBEC: 40.0000 mg | DELAYED_RELEASE_TABLET | Freq: Every day | ORAL | Status: DC

## 2014-07-29 ENCOUNTER — Other Ambulatory Visit: Payer: Self-pay | Admitting: Family Medicine

## 2014-09-29 ENCOUNTER — Telehealth: Payer: Self-pay | Admitting: Family Medicine

## 2014-09-29 DIAGNOSIS — Z79899 Other long term (current) drug therapy: Secondary | ICD-10-CM

## 2014-09-29 DIAGNOSIS — R7303 Prediabetes: Secondary | ICD-10-CM

## 2014-09-29 DIAGNOSIS — E785 Hyperlipidemia, unspecified: Secondary | ICD-10-CM

## 2014-09-29 DIAGNOSIS — E039 Hypothyroidism, unspecified: Secondary | ICD-10-CM

## 2014-09-29 DIAGNOSIS — R5382 Chronic fatigue, unspecified: Secondary | ICD-10-CM

## 2014-09-29 NOTE — Telephone Encounter (Signed)
Pt needs blood work for appt on 10-22-14 Last labs Jan 2 B12, vit d, TSH, Micro Albumin,urine, lipid, hep Bmet, and A1C On 4/4 TSH On 6/6 TSH On 6/16 A1C

## 2014-09-29 NOTE — Telephone Encounter (Signed)
Pt needs blood work orders for

## 2014-09-29 NOTE — Telephone Encounter (Signed)
I would recommend she repeat all the tests that she had back in January along with doing a CBC.

## 2014-09-30 NOTE — Telephone Encounter (Signed)
Blood work orders placed in [facility]. Patient notified.

## 2014-10-13 ENCOUNTER — Telehealth: Payer: Self-pay

## 2014-10-13 NOTE — Telephone Encounter (Signed)
Pt is requesting that her labs be sent over. Pt has a physical on  10/22/2013.

## 2014-10-16 DIAGNOSIS — E785 Hyperlipidemia, unspecified: Secondary | ICD-10-CM | POA: Diagnosis not present

## 2014-10-16 DIAGNOSIS — Z79899 Other long term (current) drug therapy: Secondary | ICD-10-CM | POA: Diagnosis not present

## 2014-10-16 DIAGNOSIS — R7309 Other abnormal glucose: Secondary | ICD-10-CM | POA: Diagnosis not present

## 2014-10-16 DIAGNOSIS — E039 Hypothyroidism, unspecified: Secondary | ICD-10-CM | POA: Diagnosis not present

## 2014-10-16 DIAGNOSIS — R5382 Chronic fatigue, unspecified: Secondary | ICD-10-CM | POA: Diagnosis not present

## 2014-10-17 LAB — HEPATIC FUNCTION PANEL
ALBUMIN: 4.4 g/dL (ref 3.5–5.2)
ALT: 41 U/L — AB (ref 0–35)
AST: 37 U/L (ref 0–37)
Alkaline Phosphatase: 61 U/L (ref 39–117)
BILIRUBIN DIRECT: 0.2 mg/dL (ref 0.0–0.3)
BILIRUBIN TOTAL: 1 mg/dL (ref 0.2–1.2)
Indirect Bilirubin: 0.8 mg/dL (ref 0.2–1.2)
Total Protein: 7.7 g/dL (ref 6.0–8.3)

## 2014-10-17 LAB — BASIC METABOLIC PANEL
BUN: 14 mg/dL (ref 6–23)
CHLORIDE: 100 meq/L (ref 96–112)
CO2: 28 mEq/L (ref 19–32)
Calcium: 10.1 mg/dL (ref 8.4–10.5)
Creat: 0.8 mg/dL (ref 0.50–1.10)
Glucose, Bld: 145 mg/dL — ABNORMAL HIGH (ref 70–99)
Potassium: 4.7 mEq/L (ref 3.5–5.3)
SODIUM: 139 meq/L (ref 135–145)

## 2014-10-17 LAB — LIPID PANEL
CHOLESTEROL: 178 mg/dL (ref 0–200)
HDL: 41 mg/dL (ref >39)
LDL CALC: 113 mg/dL — AB (ref 0–99)
TRIGLYCERIDES: 121 mg/dL (ref <150)
Total CHOL/HDL Ratio: 4.3 Ratio
VLDL: 24 mg/dL (ref 0–40)

## 2014-10-17 LAB — TSH: TSH: 0.233 u[IU]/mL — ABNORMAL LOW (ref 0.350–4.500)

## 2014-10-17 LAB — CBC WITH DIFFERENTIAL/PLATELET
BASOS ABS: 0.1 10*3/uL (ref 0.0–0.1)
BASOS PCT: 1 % (ref 0–1)
EOS PCT: 5 % (ref 0–5)
Eosinophils Absolute: 0.3 10*3/uL (ref 0.0–0.7)
HCT: 43.2 % (ref 36.0–46.0)
Hemoglobin: 14.3 g/dL (ref 12.0–15.0)
Lymphocytes Relative: 39 % (ref 12–46)
Lymphs Abs: 2.1 10*3/uL (ref 0.7–4.0)
MCH: 29.8 pg (ref 26.0–34.0)
MCHC: 33.1 g/dL (ref 30.0–36.0)
MCV: 90 fL (ref 78.0–100.0)
MONO ABS: 0.3 10*3/uL (ref 0.1–1.0)
MPV: 10.7 fL (ref 9.4–12.4)
Monocytes Relative: 6 % (ref 3–12)
NEUTROS ABS: 2.6 10*3/uL (ref 1.7–7.7)
Neutrophils Relative %: 49 % (ref 43–77)
PLATELETS: 304 10*3/uL (ref 150–400)
RBC: 4.8 MIL/uL (ref 3.87–5.11)
RDW: 13.8 % (ref 11.5–15.5)
WBC: 5.4 10*3/uL (ref 4.0–10.5)

## 2014-10-17 LAB — VITAMIN B12: Vitamin B-12: 1108 pg/mL — ABNORMAL HIGH (ref 211–911)

## 2014-10-17 LAB — HEMOGLOBIN A1C
Hgb A1c MFr Bld: 6.8 % — ABNORMAL HIGH (ref <5.7)
Mean Plasma Glucose: 148 mg/dL — ABNORMAL HIGH (ref <117)

## 2014-10-17 LAB — MICROALBUMIN, URINE: MICROALB UR: 0.2 mg/dL (ref <2.0)

## 2014-10-19 LAB — VITAMIN D 25 HYDROXY (VIT D DEFICIENCY, FRACTURES): Vit D, 25-Hydroxy: 45 ng/mL (ref 30–100)

## 2014-10-22 ENCOUNTER — Other Ambulatory Visit: Payer: Self-pay | Admitting: *Deleted

## 2014-10-22 ENCOUNTER — Encounter: Payer: Self-pay | Admitting: Family Medicine

## 2014-10-22 ENCOUNTER — Ambulatory Visit (INDEPENDENT_AMBULATORY_CARE_PROVIDER_SITE_OTHER): Payer: Commercial Managed Care - HMO | Admitting: Family Medicine

## 2014-10-22 VITALS — BP 130/70 | Ht <= 58 in | Wt 131.4 lb

## 2014-10-22 DIAGNOSIS — I10 Essential (primary) hypertension: Secondary | ICD-10-CM

## 2014-10-22 DIAGNOSIS — Z0000 Encounter for general adult medical examination without abnormal findings: Secondary | ICD-10-CM

## 2014-10-22 DIAGNOSIS — Z13820 Encounter for screening for osteoporosis: Secondary | ICD-10-CM

## 2014-10-22 DIAGNOSIS — E785 Hyperlipidemia, unspecified: Secondary | ICD-10-CM

## 2014-10-22 DIAGNOSIS — E038 Other specified hypothyroidism: Secondary | ICD-10-CM

## 2014-10-22 DIAGNOSIS — E119 Type 2 diabetes mellitus without complications: Secondary | ICD-10-CM

## 2014-10-22 MED ORDER — HYDROCODONE-ACETAMINOPHEN 7.5-325 MG PO TABS: ORAL_TABLET | ORAL | Status: DC

## 2014-10-22 NOTE — Progress Notes (Signed)
Subjective:    Patient ID: Nicole Lam, female    DOB: 12/07/1950, 64 y.o.   MRN: [id number]  HPI AWV- Annual Wellness Visit  The patient was seen for their annual wellness visit. The patient's past medical history, surgical history, and family history were reviewed. Pertinent vaccines were reviewed ( tetanus, pneumonia, shingles, flu) The patient's medication list was reviewed and updated.  The height and weight were entered. The patient's current BMI is: 27.94  Cognitive screening was completed. Outcome of Mini - Cog: passed  Falls within the past 6 months: none  Current tobacco usage: non-smoker (All patients who use tobacco were given written and verbal information on quitting)  Recent listing of emergency department/hospitalizations over the past year were reviewed.  current specialist the patient sees on a regular basis: none   Medicare annual wellness visit patient questionnaire was reviewed.  A written screening schedule for the patient for the next 5-10 years was given. Appropriate discussion of followup regarding next visit was discussed.  Patient states that she has no concerns at this time.   we talked at length about her diabetes importance of watching diet the importance of exercising bring her weight down and taking medication  we also covered her blood pressure diabetes cholesterol hypothyroidism and elevated liver enzymes today as well.  Review of Systems  Constitutional: Negative for activity change, appetite change and fatigue.  HENT: Negative for congestion, ear discharge and rhinorrhea.   Eyes: Negative for discharge.  Respiratory: Negative for cough, chest tightness and wheezing.   Cardiovascular: Negative for chest pain.  Gastrointestinal: Negative for vomiting and abdominal pain.  Genitourinary: Negative for frequency and difficulty urinating.  Musculoskeletal: Negative for neck pain.  Allergic/Immunologic: Negative for environmental allergies  and food allergies.  Neurological: Negative for weakness and headaches.  Psychiatric/Behavioral: Negative for behavioral problems and agitation.       Objective:   Physical Exam  Constitutional: She is oriented to person, place, and time. She appears well-developed and well-nourished.  HENT:  Head: Normocephalic.  Right Ear: External ear normal.  Left Ear: External ear normal.  Eyes: Pupils are equal, round, and reactive to light.  Neck: Normal range of motion. No thyromegaly present.  Cardiovascular: Normal rate, regular rhythm, normal heart sounds and intact distal pulses.   No murmur heard. Pulmonary/Chest: Effort normal and breath sounds normal. No respiratory distress. She has no wheezes.  Abdominal: Soft. Bowel sounds are normal. She exhibits no distension and no mass. There is no tenderness.  Genitourinary: Guaiac stool:    Musculoskeletal: Normal range of motion. She exhibits no edema or tenderness.  Lymphadenopathy:    She has no cervical adenopathy.  Neurological: She is alert and oriented to person, place, and time. She exhibits normal muscle tone.  Skin: Skin is warm and dry.  Psychiatric: She has a normal mood and affect. Her behavior is normal.          Assessment & Plan:   #1 diabetes subpar control A1c is gone up does not need to change her medicine she just needs to do better with diet and eliminate eating candy bars every day when she works she also needs to increase the amount of activity and try to bring abdominal fat down #2 elevated liver enzymes slight elevation watch diet reduce weight exercise #3 hyperlipidemia LDL has gone up he states she cannot tolerate higher dose of cholesterol medicine stick with that watch diet  #4 wellness exam was completed today as part of  Medicare. She was counseled regarding healthy diet immunizations when her next colonoscopy is  She was advised to get colonoscopy this year she was given a sheet to do so she will set that  up   reflux was also talked about. She is under good control with her current medication   her thyroid showed that we needed to adjust the medicine down to half a tablet twice per week 1 on all the other days   she was also given pain medication as she uses rarely for her low back. She is aware of drowsiness not to overuse it   This patient was seen essentially for 2 visits today 1 was annual wellness exam the other one was problem oriented health issues

## 2014-10-22 NOTE — Patient Instructions (Signed)
Diabetes Mellitus and Food It is important for you to manage your blood sugar (glucose) level. Your blood glucose level can be greatly affected by what you eat. Eating healthier foods in the appropriate amounts throughout the day at about the same time each day will help you control your blood glucose level. It can also help slow or prevent worsening of your diabetes mellitus. Healthy eating may even help you improve the level of your blood pressure and reach or maintain a healthy weight.  HOW CAN FOOD AFFECT ME? Carbohydrates Carbohydrates affect your blood glucose level more than any other type of food. Your dietitian will help you determine how many carbohydrates to eat at each meal and teach you how to count carbohydrates. Counting carbohydrates is important to keep your blood glucose at a healthy level, especially if you are using insulin or taking certain medicines for diabetes mellitus. Alcohol Alcohol can cause sudden decreases in blood glucose (hypoglycemia), especially if you use insulin or take certain medicines for diabetes mellitus. Hypoglycemia can be a life-threatening condition. Symptoms of hypoglycemia (sleepiness, dizziness, and disorientation) are similar to symptoms of having too much alcohol.  If your health care provider has given you approval to drink alcohol, do so in moderation and use the following guidelines:  Women should not have more than one drink per day, and men should not have more than two drinks per day. One drink is equal to:  12 oz of beer.  5 oz of wine.  1 oz of hard liquor.  Do not drink on an empty stomach.  Keep yourself hydrated. Have water, diet soda, or unsweetened iced tea.  Regular soda, juice, and other mixers might contain a lot of carbohydrates and should be counted. WHAT FOODS ARE NOT RECOMMENDED? As you make food choices, it is important to remember that all foods are not the same. Some foods have fewer nutrients per serving than other  foods, even though they might have the same number of calories or carbohydrates. It is difficult to get your body what it needs when you eat foods with fewer nutrients. Examples of foods that you should avoid that are high in calories and carbohydrates but low in nutrients include:  Trans fats (most processed foods list trans fats on the Nutrition Facts label).  Regular soda.  Juice.  Candy.  Sweets, such as cake, pie, doughnuts, and cookies.  Fried foods. WHAT FOODS CAN I EAT? Have nutrient-rich foods, which will nourish your body and keep you healthy. The food you should eat also will depend on several factors, including:  The calories you need.  The medicines you take.  Your weight.  Your blood glucose level.  Your blood pressure level.  Your cholesterol level. You also should eat a variety of foods, including:  Protein, such as meat, poultry, fish, tofu, nuts, and seeds (lean animal proteins are best).  Fruits.  Vegetables.  Dairy products, such as milk, cheese, and yogurt (low fat is best).  Breads, grains, pasta, cereal, rice, and beans.  Fats such as olive oil, trans fat-free margarine, canola oil, avocado, and olives. DOES EVERYONE WITH DIABETES MELLITUS HAVE THE SAME MEAL PLAN? Because every person with diabetes mellitus is different, there is not one meal plan that works for everyone. It is very important that you meet with a dietitian who will help you create a meal plan that is just right for you. Document Released: 06/28/2005 Document Revised: 10/06/2013 Document Reviewed: 08/28/2013 Exit[employer] Patient Information 2015 [employer]. This   information is not intended to replace advice given to you by your health care provider. Make sure you discuss any questions you have with your health care provider.  

## 2014-10-27 ENCOUNTER — Other Ambulatory Visit: Payer: Self-pay | Admitting: *Deleted

## 2014-10-27 MED ORDER — ALPRAZOLAM 0.25 MG PO TABS: 0.2500 mg | ORAL_TABLET | Freq: Three times a day (TID) | ORAL | Status: DC | PRN

## 2014-10-27 MED ORDER — METFORMIN HCL 500 MG PO TABS: ORAL_TABLET | ORAL | Status: DC

## 2014-10-27 MED ORDER — LISINOPRIL 2.5 MG PO TABS: 2.5000 mg | ORAL_TABLET | Freq: Every day | ORAL | Status: DC

## 2014-10-27 MED ORDER — PANTOPRAZOLE SODIUM 40 MG PO TBEC: 40.0000 mg | DELAYED_RELEASE_TABLET | Freq: Every day | ORAL | Status: DC

## 2014-10-27 MED ORDER — LEVOTHYROXINE SODIUM 75 MCG PO TABS: 75.0000 ug | ORAL_TABLET | Freq: Every day | ORAL | Status: DC

## 2014-10-27 MED ORDER — SIMVASTATIN 20 MG PO TABS: 20.0000 mg | ORAL_TABLET | Freq: Every evening | ORAL | Status: DC

## 2014-10-27 MED ORDER — MELOXICAM 15 MG PO TABS: ORAL_TABLET | ORAL | Status: DC

## 2014-10-27 NOTE — Telephone Encounter (Signed)
Just saw pt ok for 6 months on meds

## 2014-10-29 ENCOUNTER — Ambulatory Visit (HOSPITAL_COMMUNITY)
Admission: RE | Admit: 2014-10-29 | Discharge: 2014-10-29 | Disposition: A | Discharge status: Home / Self Care | Payer: Commercial Managed Care - HMO | Source: Ambulatory Visit | Attending: Family Medicine | Admitting: Family Medicine

## 2014-10-29 DIAGNOSIS — Z13820 Encounter for screening for osteoporosis: Secondary | ICD-10-CM | POA: Diagnosis not present

## 2014-10-29 DIAGNOSIS — Z780 Asymptomatic menopausal state: Secondary | ICD-10-CM | POA: Insufficient documentation

## 2014-10-29 DIAGNOSIS — M859 Disorder of bone density and structure, unspecified: Secondary | ICD-10-CM | POA: Diagnosis not present

## 2015-03-25 ENCOUNTER — Other Ambulatory Visit: Payer: Self-pay | Admitting: Obstetrics and Gynecology

## 2015-03-25 DIAGNOSIS — Z1231 Encounter for screening mammogram for malignant neoplasm of breast: Secondary | ICD-10-CM

## 2015-04-01 ENCOUNTER — Ambulatory Visit (HOSPITAL_COMMUNITY)
Admission: RE | Admit: 2015-04-01 | Discharge: 2015-04-01 | Disposition: A | Discharge status: Home / Self Care | Payer: Commercial Managed Care - HMO | Source: Ambulatory Visit | Attending: Obstetrics and Gynecology | Admitting: Obstetrics and Gynecology

## 2015-04-01 DIAGNOSIS — Z1231 Encounter for screening mammogram for malignant neoplasm of breast: Secondary | ICD-10-CM | POA: Insufficient documentation

## 2015-04-07 ENCOUNTER — Other Ambulatory Visit: Payer: Self-pay | Admitting: Family Medicine

## 2015-04-07 NOTE — Telephone Encounter (Signed)
Needs office visit.

## 2015-04-27 ENCOUNTER — Other Ambulatory Visit: Payer: Self-pay | Admitting: Family Medicine

## 2015-05-04 ENCOUNTER — Other Ambulatory Visit: Payer: Self-pay | Admitting: Family Medicine

## 2015-06-01 ENCOUNTER — Ambulatory Visit (INDEPENDENT_AMBULATORY_CARE_PROVIDER_SITE_OTHER): Payer: Commercial Managed Care - HMO | Admitting: Obstetrics and Gynecology

## 2015-06-01 ENCOUNTER — Encounter: Payer: Self-pay | Admitting: Obstetrics and Gynecology

## 2015-06-01 VITALS — BP 144/78 | HR 68 | Ht 59.5 in | Wt 131.4 lb

## 2015-06-01 DIAGNOSIS — N898 Other specified noninflammatory disorders of vagina: Secondary | ICD-10-CM

## 2015-06-01 DIAGNOSIS — Z124 Encounter for screening for malignant neoplasm of cervix: Secondary | ICD-10-CM

## 2015-06-01 DIAGNOSIS — Z0000 Encounter for general adult medical examination without abnormal findings: Secondary | ICD-10-CM

## 2015-06-01 MED ORDER — ESTRADIOL 0.5 MG PO TABS: 0.5000 mg | ORAL_TABLET | Freq: Every day | ORAL | Status: DC

## 2015-06-01 NOTE — Progress Notes (Signed)
This chart was scribed for Nicole Kind, MD by Nicole Lam, ED Scribe. This patient was seen in room 1 and the patient's care was started at 10:00 AM.   Patient ID: Nicole Lam, female   DOB: 27-Apr-1951, 64 y.o.   MRN: [id number]  Assessment:  Annual Gyn Exam   Plan:  1. pap smearNOT  done, no future paps needed 2. return annually or prn for HT management 3    Annual mammogram advised Subjective:  Nicole Lam is a 64 y.o. female who presents for annual exam. No LMP recorded. Patient has had a full hysterectomy.  She states her last pap was a year ago. She denies any weight gain, chills, or diaphoresis. She is sexually active. She denies any dyspareunia. The patient has no major complaints. She has a blister to the left upper labia majora. She said the area burst and she has been treating it with neosporin and facial cream. She states she has had areas like this in the past. She also notes mild urinary incontinence when she sneezes but she states she does not have to wear a panty line for mgmt.  The following portions of the patient's history were reviewed and updated as appropriate: allergies, current medications, past family history, past medical history, past social history, past surgical history and problem list. Past Medical History  Diagnosis Date  . Lipoma of neck   . Hypothyroidism   . Hyperlipidemia   . Fatty liver     Past Surgical History  Procedure Laterality Date  . Dilation and curettage of uterus    . Abdominal hysterectomy    . Colonoscopy    . Appendectomy       Current outpatient prescriptions:  .  ALPRAZolam (XANAX) 0.25 MG tablet, Take 1 tablet (0.25 mg total) by mouth 3 (three) times daily as needed for anxiety or sleep., Disp: 30 tablet, Rfl: 5 .  aspirin 81 MG tablet, Take 81 mg by mouth 2 (two) times daily., Disp: , Rfl:  .  beta carotene w/minerals (OCUVITE) tablet, Take 1 tablet by mouth daily., Disp: , Rfl:  .  docusate sodium (COLACE) 100  MG capsule, Take 100 mg by mouth 2 (two) times daily., Disp: , Rfl:  .  estradiol (ESTRACE) 0.5 MG tablet, Take 1 tablet (0.5 mg total) by mouth daily., Disp: 30 tablet, Rfl: 11 .  HYDROcodone-acetaminophen (NORCO) 7.5-325 MG per tablet, 1/2 to 1  bid prn, Disp: 30 tablet, Rfl: 0 .  KRILL OIL PO, Take 1 capsule by mouth daily., Disp: , Rfl:  .  levothyroxine (SYNTHROID, LEVOTHROID) 75 MCG tablet, TAKE 1 TABLET (75 MCG TOTAL) BY MOUTH DAILY BEFORE BREAKFAST., Disp: 30 tablet, Rfl: 0 .  lisinopril (PRINIVIL,ZESTRIL) 2.5 MG tablet, TAKE 1 TABLET (2.5 MG TOTAL) BY MOUTH DAILY., Disp: 90 tablet, Rfl: 0 .  meloxicam (MOBIC) 15 MG tablet, TAKE ONE TABLET BY MOUTH ONCE DAILY, Disp: 30 tablet, Rfl: 5 .  metFORMIN (GLUCOPHAGE) 500 MG tablet, TAKE TWO TABLETS BY MOUTH IN THE MORNING AND ONE TABLET IN THE EVENING WITH MEALS, Disp: 90 tablet, Rfl: 0 .  milk thistle 175 MG tablet, Take 175 mg by mouth daily. Take QD, Disp: , Rfl:  .  Multiple Vitamins-Minerals (MULTIVITAMIN WITH MINERALS) tablet, Take 1 tablet by mouth daily., Disp: , Rfl:  .  pantoprazole (PROTONIX) 40 MG tablet, Take 1 tablet (40 mg total) by mouth daily., Disp: 90 tablet, Rfl: 1 .  pyridoxine (B-6) 100 MG tablet, Take 100  mg by mouth daily. 2 in the am, and 2 in the evening, Disp: , Rfl:  .  simvastatin (ZOCOR) 20 MG tablet, Take 1 tablet (20 mg total) by mouth every evening., Disp: 90 tablet, Rfl: 1 .  fish oil-omega-3 fatty acids 1000 MG capsule, Take 2 g by mouth daily., Disp: , Rfl:   Review of Systems Constitutional: negative Gastrointestinal: negative Genitourinary: positive for genital sore and mild urinary incontinence.  Objective:  BP 144/78 mmHg  Pulse 68  Ht 4' 11.5" (1.511 m)  Wt 131 lb 6.4 oz (59.603 kg)  BMI 26.11 kg/m2   BMI: Body mass index is 26.11 kg/(m^2).  General Appearance: Alert, appropriate appearance for age. No acute distress HEENT: Grossly normal Neck / Thyroid:  Cardiovascular: RRR; normal S1, S2, no  murmur Lungs: CTA bilaterally Back: No CVAT Breast Exam: No dimpling, nipple retraction or discharge. No masses or nodes., Normal to inspection, Normal breast tissue bilaterally and No masses or nodes.No dimpling, nipple retraction or discharge. Gastrointestinal: Soft, non-tender, no masses or organomegaly Pelvic Exam: Vulva and vagina appear normal. No masses External genitalia: mons pubis 3 clusters of erythematous area with single vesicle which is opened and cultured for HSV Uterus: removed surgically Rectovaginal: normal rectal, no masses and guaiac negative stool obtained Lymphatic Exam: Non-palpable nodes in neck, clavicular, axillary, or inguinal regions Skin: no rash or abnormalities Neurologic: Normal gait and speech, no tremor  Psychiatric: Alert and oriented, appropriate affect.    Urinalysis:, not done and Not done  Nicole Lam. MD Pgr [phone] 10:00 AM  I personally performed the services described in this documentation, which was SCRIBED in my presence. The recorded information has been reviewed and considered accurate. It has been edited as necessary during review. Nicole Kind, MD

## 2015-06-02 ENCOUNTER — Other Ambulatory Visit: Payer: Self-pay | Admitting: Family Medicine

## 2015-06-04 LAB — HERPES SIMPLEX VIRUS CULTURE

## 2015-06-05 ENCOUNTER — Other Ambulatory Visit: Payer: Self-pay | Admitting: Obstetrics and Gynecology

## 2015-06-05 ENCOUNTER — Telehealth: Payer: Self-pay | Admitting: Obstetrics and Gynecology

## 2015-06-05 DIAGNOSIS — B009 Herpesviral infection, unspecified: Secondary | ICD-10-CM | POA: Insufficient documentation

## 2015-06-05 MED ORDER — ACYCLOVIR 400 MG PO TABS: 400.0000 mg | ORAL_TABLET | Freq: Every day | ORAL | Status: DC

## 2015-06-05 NOTE — Telephone Encounter (Signed)
Pt made aware of HSV II infection, Rx for acyclovir called in to [facility] (escribe)

## 2015-06-07 DIAGNOSIS — Z0100 Encounter for examination of eyes and vision without abnormal findings: Secondary | ICD-10-CM | POA: Diagnosis not present

## 2015-06-23 ENCOUNTER — Other Ambulatory Visit: Payer: Self-pay | Admitting: Family Medicine

## 2015-07-09 ENCOUNTER — Other Ambulatory Visit: Payer: Self-pay | Admitting: Family Medicine

## 2015-07-12 ENCOUNTER — Other Ambulatory Visit: Payer: Self-pay | Admitting: Family Medicine

## 2015-07-26 ENCOUNTER — Other Ambulatory Visit: Payer: Self-pay | Admitting: Family Medicine

## 2015-08-04 ENCOUNTER — Other Ambulatory Visit: Payer: Self-pay | Admitting: Family Medicine

## 2015-08-04 NOTE — Telephone Encounter (Signed)
May I refill. Last office visit January 2016

## 2015-08-24 ENCOUNTER — Other Ambulatory Visit: Payer: Self-pay | Admitting: Family Medicine

## 2015-08-29 ENCOUNTER — Other Ambulatory Visit: Payer: Self-pay | Admitting: Family Medicine

## 2015-08-29 ENCOUNTER — Telehealth: Payer: Self-pay | Admitting: Family Medicine

## 2015-08-29 NOTE — Telephone Encounter (Signed)
Pt would like for her labs to be sent over for her upcoming appt. Pt would like to go in the morning if possible. Last labs per epic were: BMP,CBC,HEPATIC,LIPID,MICROALBUMIN,A1C,TSH,VIT D AND VIT B12

## 2015-08-30 ENCOUNTER — Other Ambulatory Visit: Payer: Self-pay | Admitting: Family Medicine

## 2015-08-30 ENCOUNTER — Telehealth: Payer: Self-pay | Admitting: Family Medicine

## 2015-08-30 DIAGNOSIS — R748 Abnormal levels of other serum enzymes: Secondary | ICD-10-CM

## 2015-08-30 DIAGNOSIS — E039 Hypothyroidism, unspecified: Secondary | ICD-10-CM

## 2015-08-30 DIAGNOSIS — E785 Hyperlipidemia, unspecified: Secondary | ICD-10-CM

## 2015-08-30 DIAGNOSIS — Z79899 Other long term (current) drug therapy: Secondary | ICD-10-CM

## 2015-08-30 DIAGNOSIS — E119 Type 2 diabetes mellitus without complications: Secondary | ICD-10-CM

## 2015-08-30 NOTE — Telephone Encounter (Signed)
The patient may go ahead with lipid, liver, hemoglobin V2F, metabolic 7,urine ACR, C14, TSH

## 2015-08-30 NOTE — Telephone Encounter (Signed)
Patient calling today requesting labs before next appointment. Last labs 10/16/14: BMET, CBC, Hepatic, Lipid panel, microalbumin urine, hemoglobin A1c, TSH, vitamin D, vitamin B12. Please advise?

## 2015-08-30 NOTE — Addendum Note (Signed)
Addended by: Carmelina Noun on: 08/30/2015 10:01 AM   Modules accepted: Orders

## 2015-08-30 NOTE — Telephone Encounter (Signed)
Orders ready. Pt notified.  

## 2015-08-30 NOTE — Telephone Encounter (Signed)
See other message

## 2015-09-03 DIAGNOSIS — Z79899 Other long term (current) drug therapy: Secondary | ICD-10-CM | POA: Diagnosis not present

## 2015-09-03 DIAGNOSIS — E039 Hypothyroidism, unspecified: Secondary | ICD-10-CM | POA: Diagnosis not present

## 2015-09-03 DIAGNOSIS — E785 Hyperlipidemia, unspecified: Secondary | ICD-10-CM | POA: Diagnosis not present

## 2015-09-03 DIAGNOSIS — E119 Type 2 diabetes mellitus without complications: Secondary | ICD-10-CM | POA: Diagnosis not present

## 2015-09-03 DIAGNOSIS — R748 Abnormal levels of other serum enzymes: Secondary | ICD-10-CM | POA: Diagnosis not present

## 2015-09-05 ENCOUNTER — Other Ambulatory Visit: Payer: Self-pay | Admitting: Family Medicine

## 2015-09-05 LAB — MICROALBUMIN / CREATININE URINE RATIO
Creatinine, Urine: 57.7 mg/dL
MICROALB/CREAT RATIO: <5.2 mg/g creat (ref 0.0–30.0)

## 2015-09-05 LAB — LIPID PANEL
Chol/HDL Ratio: 4.3 ratio units (ref 0.0–4.4)
Cholesterol, Total: 184 mg/dL (ref 100–199)
HDL: 43 mg/dL (ref >39)
LDL CALC: 117 mg/dL — AB (ref 0–99)
Triglycerides: 122 mg/dL (ref 0–149)
VLDL CHOLESTEROL CAL: 24 mg/dL (ref 5–40)

## 2015-09-05 LAB — VITAMIN B12: Vitamin B-12: 541 pg/mL (ref 211–946)

## 2015-09-05 LAB — HEPATIC FUNCTION PANEL
ALBUMIN: 4.2 g/dL (ref 3.6–4.8)
ALT: 67 IU/L — ABNORMAL HIGH (ref 0–32)
AST: 80 IU/L — AB (ref 0–40)
Alkaline Phosphatase: 66 IU/L (ref 39–117)
Bilirubin Total: 0.5 mg/dL (ref 0.0–1.2)
Bilirubin, Direct: 0.13 mg/dL (ref 0.00–0.40)
TOTAL PROTEIN: 6.9 g/dL (ref 6.0–8.5)

## 2015-09-05 LAB — BASIC METABOLIC PANEL
BUN/Creatinine Ratio: 18 (ref 11–26)
BUN: 17 mg/dL (ref 8–27)
CO2: 25 mmol/L (ref 18–29)
Calcium: 9.3 mg/dL (ref 8.7–10.3)
Chloride: 100 mmol/L (ref 97–106)
Creatinine, Ser: 0.94 mg/dL (ref 0.57–1.00)
GFR calc Af Amer: 74 mL/min/{1.73_m2} (ref >59)
GFR, EST NON AFRICAN AMERICAN: 64 mL/min/{1.73_m2} (ref >59)
GLUCOSE: 133 mg/dL — AB (ref 65–99)
POTASSIUM: 4.5 mmol/L (ref 3.5–5.2)
SODIUM: 141 mmol/L (ref 136–144)

## 2015-09-05 LAB — HEMOGLOBIN A1C
Est. average glucose Bld gHb Est-mCnc: 166 mg/dL
HEMOGLOBIN A1C: 7.4 % — AB (ref 4.8–5.6)

## 2015-09-05 LAB — TSH: TSH: 5.05 u[IU]/mL — ABNORMAL HIGH (ref 0.450–4.500)

## 2015-09-16 ENCOUNTER — Encounter: Payer: Self-pay | Admitting: Family Medicine

## 2015-09-16 ENCOUNTER — Ambulatory Visit (INDEPENDENT_AMBULATORY_CARE_PROVIDER_SITE_OTHER): Payer: Commercial Managed Care - HMO | Admitting: Family Medicine

## 2015-09-16 VITALS — BP 128/82 | Ht <= 58 in | Wt 134.4 lb

## 2015-09-16 DIAGNOSIS — R7401 Elevation of levels of liver transaminase levels: Secondary | ICD-10-CM

## 2015-09-16 DIAGNOSIS — E119 Type 2 diabetes mellitus without complications: Secondary | ICD-10-CM | POA: Insufficient documentation

## 2015-09-16 DIAGNOSIS — D509 Iron deficiency anemia, unspecified: Secondary | ICD-10-CM | POA: Diagnosis not present

## 2015-09-16 DIAGNOSIS — D649 Anemia, unspecified: Secondary | ICD-10-CM

## 2015-09-16 DIAGNOSIS — E039 Hypothyroidism, unspecified: Secondary | ICD-10-CM | POA: Diagnosis not present

## 2015-09-16 DIAGNOSIS — Z23 Encounter for immunization: Secondary | ICD-10-CM | POA: Diagnosis not present

## 2015-09-16 DIAGNOSIS — R740 Nonspecific elevation of levels of transaminase and lactic acid dehydrogenase [LDH]: Secondary | ICD-10-CM | POA: Diagnosis not present

## 2015-09-16 DIAGNOSIS — E785 Hyperlipidemia, unspecified: Secondary | ICD-10-CM | POA: Diagnosis not present

## 2015-09-16 LAB — POCT HEMOGLOBIN: Hemoglobin: 11.9 g/dL — AB (ref 12.2–16.2)

## 2015-09-16 MED ORDER — METFORMIN HCL 500 MG PO TABS: ORAL_TABLET | ORAL | Status: DC

## 2015-09-16 MED ORDER — LEVOTHYROXINE SODIUM 75 MCG PO TABS: ORAL_TABLET | ORAL | Status: DC

## 2015-09-16 MED ORDER — LISINOPRIL 5 MG PO TABS
5.0000 mg | ORAL_TABLET | Freq: Every day | ORAL | Status: DC
Start: 2015-09-16 — End: 2016-03-23

## 2015-09-16 MED ORDER — HYDROCODONE-ACETAMINOPHEN 7.5-325 MG PO TABS
ORAL_TABLET | ORAL | Status: DC
Start: 2015-09-16 — End: 2016-01-20

## 2015-09-16 MED ORDER — ALPRAZOLAM 0.25 MG PO TABS: 0.2500 mg | ORAL_TABLET | Freq: Three times a day (TID) | ORAL | Status: DC | PRN

## 2015-09-16 MED ORDER — PANTOPRAZOLE SODIUM 40 MG PO TBEC: 40.0000 mg | DELAYED_RELEASE_TABLET | Freq: Every day | ORAL | Status: DC

## 2015-09-16 NOTE — Progress Notes (Signed)
   Subjective:    Patient ID: Nicole Lam, female    DOB: 10/09/1951, 64 y.o.   MRN: [id number]  Hyperlipidemia This is a chronic problem. The current episode started more than 1 year ago. Pertinent negatives include no chest pain. Treatments tried: statin stopped due to elevated liver enzymes. There are no compliance problems.    Patient takes her thyroid medicine as directed takes half tablet twice per week other days 1 full tablet Patient does try to watch starches in her diet to some degree and takes her metformin without difficulty Patient states she has went to give blood 2 separate occasions and was told that she was anemic in could not of blood She denies any rectal bleeding Had colonoscopy approximately 10 years ago is due for colonoscopy.   Review of Systems  Constitutional: Negative for activity change, appetite change and fatigue.  HENT: Negative for congestion.   Respiratory: Negative for cough.   Cardiovascular: Negative for chest pain.  Gastrointestinal: Negative for abdominal pain.  Endocrine: Negative for polydipsia and polyphagia.  Neurological: Negative for weakness.  Psychiatric/Behavioral: Negative for confusion.       Objective:   Physical Exam  Constitutional: She appears well-nourished. No distress.  Cardiovascular: Normal rate, regular rhythm and normal heart sounds.   No murmur heard. Pulmonary/Chest: Effort normal and breath sounds normal. No respiratory distress.  Musculoskeletal: She exhibits no edema.  Lymphadenopathy:    She has no cervical adenopathy.  Neurological: She is alert. She exhibits normal muscle tone.  Psychiatric: Her behavior is normal.  Vitals reviewed.        statin. Statin Assessment & Plan:  Anemia-we need to check lab work. Hemoccult cards probable referral for gastroenterology await the results of all the tests first Hyperlipidemia recently liver enzymes elevated she was told to stop statin statin  Elevated liver  enzymes fatty liver possibly related to statin await the results Hyperlipidemia does need to be treated Diabetes subpar control increase metformin 25 minutes was spent with the patient. Greater than half the time was spent in discussion and answering questions and counseling regarding the issues that the patient came in for today.  Chronic low back pain hydrocodone when necessary cautioned drowsiness not for frequent use Xanax home use only when necessary for anxiety not for overuse

## 2015-09-17 LAB — CBC WITH DIFFERENTIAL/PLATELET
Basophils Absolute: 0.1 x10E3/uL (ref 0.0–0.2)
Basos: 1 %
EOS (ABSOLUTE): 0.3 x10E3/uL (ref 0.0–0.4)
Eos: 7 %
Hematocrit: 35.5 % (ref 34.0–46.6)
Hemoglobin: 11.3 g/dL (ref 11.1–15.9)
Immature Grans (Abs): 0 x10E3/uL (ref 0.0–0.1)
Immature Granulocytes: 0 %
Lymphocytes Absolute: 1.8 x10E3/uL (ref 0.7–3.1)
Lymphs: 37 %
MCH: 26.1 pg — ABNORMAL LOW (ref 26.6–33.0)
MCHC: 31.8 g/dL (ref 31.5–35.7)
MCV: 82 fL (ref 79–97)
Monocytes Absolute: 0.3 x10E3/uL (ref 0.1–0.9)
Monocytes: 7 %
Neutrophils Absolute: 2.4 x10E3/uL (ref 1.4–7.0)
Neutrophils: 48 %
Platelets: 350 x10E3/uL (ref 150–379)
RBC: 4.33 x10E6/uL (ref 3.77–5.28)
RDW: 14.9 % (ref 12.3–15.4)
WBC: 5 x10E3/uL (ref 3.4–10.8)

## 2015-09-17 LAB — IRON AND TIBC
Iron Saturation: 12 % — ABNORMAL LOW (ref 15–55)
Iron: 45 ug/dL (ref 27–139)
Total Iron Binding Capacity: 380 ug/dL (ref 250–450)
UIBC: 335 ug/dL (ref 118–369)

## 2015-09-17 LAB — FERRITIN: Ferritin: 24 ng/mL (ref 15–150)

## 2015-09-19 ENCOUNTER — Encounter: Payer: Self-pay | Admitting: Family Medicine

## 2015-09-19 ENCOUNTER — Other Ambulatory Visit: Payer: Self-pay

## 2015-09-19 DIAGNOSIS — Z1211 Encounter for screening for malignant neoplasm of colon: Secondary | ICD-10-CM

## 2015-09-26 ENCOUNTER — Telehealth: Payer: Self-pay

## 2015-09-26 NOTE — Telephone Encounter (Signed)
[phone]  PATIENT RECEIVED LETTER TO SCHEDULE TCS

## 2015-09-27 NOTE — Telephone Encounter (Signed)
LM for pt to returncall

## 2015-09-28 ENCOUNTER — Other Ambulatory Visit: Payer: Self-pay

## 2015-09-28 DIAGNOSIS — D649 Anemia, unspecified: Secondary | ICD-10-CM

## 2015-09-28 LAB — POC HEMOCCULT BLD/STL (HOME/3-CARD/SCREEN)
FECAL OCCULT BLD: NEGATIVE
FECAL OCCULT BLD: NEGATIVE
FECAL OCCULT BLD: NEGATIVE

## 2015-10-02 ENCOUNTER — Encounter: Payer: Self-pay | Admitting: Family Medicine

## 2015-10-12 NOTE — Telephone Encounter (Signed)
Left message for pt to call, she gets home after 4:00 pm.

## 2015-10-12 NOTE — Progress Notes (Unsigned)
B/P: 115/68 by: Colin Benton RN

## 2015-10-12 NOTE — Telephone Encounter (Signed)
PT is ware of her OV with Walden Field, NP on 10/24/2015 at 10:30 AM.

## 2015-10-13 ENCOUNTER — Telehealth: Payer: Self-pay

## 2015-10-13 NOTE — Telephone Encounter (Signed)
RECEIVED THE AUTHORIZATION # FOR [employer] REFERRAL. #[id number] 10/21/2015-04/18/2016 FOR Z12.11

## 2015-10-24 ENCOUNTER — Other Ambulatory Visit: Payer: Self-pay

## 2015-10-24 ENCOUNTER — Ambulatory Visit (INDEPENDENT_AMBULATORY_CARE_PROVIDER_SITE_OTHER): Payer: Commercial Managed Care - HMO | Admitting: Nurse Practitioner

## 2015-10-24 ENCOUNTER — Encounter: Payer: Self-pay | Admitting: Nurse Practitioner

## 2015-10-24 VITALS — BP 150/87 | HR 89 | Temp 98.3°F | Ht 59.0 in | Wt 132.8 lb

## 2015-10-24 DIAGNOSIS — Z1211 Encounter for screening for malignant neoplasm of colon: Secondary | ICD-10-CM

## 2015-10-24 DIAGNOSIS — E039 Hypothyroidism, unspecified: Secondary | ICD-10-CM | POA: Diagnosis not present

## 2015-10-24 DIAGNOSIS — R740 Nonspecific elevation of levels of transaminase and lactic acid dehydrogenase [LDH]: Secondary | ICD-10-CM | POA: Diagnosis not present

## 2015-10-24 MED ORDER — NA SULFATE-K SULFATE-MG SULF 17.5-3.13-1.6 GM/177ML PO SOLN: 1.0000 | Freq: Once | ORAL | Status: AC

## 2015-10-24 NOTE — Progress Notes (Signed)
Primary Care Physician:  Sallee Lange, MD Primary Gastroenterologist:  Dr. Gala Romney  Chief Complaint  Patient presents with  . Colonoscopy    HPI:   65 year old female presents on referral from PCP for repeat screening colonoscopy. She was brought in for an office visit due to recent anemia. Last colonoscopy completed 05/30/2005 which found single anal papilla otherwise normal rectum, normal colon. Recommend repeat colonoscopy in 10 years. Noted iron deficiency anemia per PCP notes. PCP note reviewed. Patient tried to give blood 2 times and both times told she could not because of being anemic. Denied rectal bleeding at that time. Last CBC completed 09/16/2015 by PCP which noted borderline normal hemoglobin/hematocrit at 11.3/35.5. Follow-up Hemoccult cards were negative 3. Regardless, she is due for repeat screening colonoscopy.  Today she states she's doing well except she has a head cold. She was recently taken off statin due increased LFTs. Occasional abdominal pain due to constipation and trying to have a bowel movement which she manages with stool softeners. Not needed to take regularly, occasional/rare problem. Denies hematochezia, melena, N/V, unintentional weight loss, fever, chills. Denies chest pain, dyspnea, dizziness, lightheadedness, syncope, near syncope. Denies any other upper or lower GI symptoms.  Xanax "every once in a while I'll take a half." Takes Norco "not very often" and only takes a half when she needs it.   Past Medical History  Diagnosis Date  . Lipoma of neck   . Hypothyroidism   . Hyperlipidemia   . Fatty liver     Past Surgical History  Procedure Laterality Date  . Dilation and curettage of uterus    . Abdominal hysterectomy    . Colonoscopy  05/30/05    [id number] anal papilla, otherwise normal rectum.and colon  . Appendectomy      Current Outpatient Prescriptions  Medication Sig Dispense Refill  . acyclovir (ZOVIRAX) 400 MG tablet Take 1 tablet  (400 mg total) by mouth 5 (five) times daily. 25 tablet prn  . ALPRAZolam (XANAX) 0.25 MG tablet Take 1 tablet (0.25 mg total) by mouth 3 (three) times daily as needed for anxiety or sleep. 30 tablet 5  . aspirin 81 MG tablet Take 81 mg by mouth 2 (two) times daily.    . beta carotene w/minerals (OCUVITE) tablet Take 1 tablet by mouth daily.    Marland Kitchen docusate sodium (COLACE) 100 MG capsule Take 100 mg by mouth 2 (two) times daily.    Marland Kitchen estradiol (ESTRACE) 0.5 MG tablet Take 1 tablet (0.5 mg total) by mouth daily. 30 tablet 11  . fish oil-omega-3 fatty acids 1000 MG capsule Take 2 g by mouth daily.    Marland Kitchen HYDROcodone-acetaminophen (NORCO) 7.5-325 MG tablet 1/2 to 1  bid prn 40 tablet 0  . KRILL OIL PO Take 1 capsule by mouth daily.    Marland Kitchen levothyroxine (SYNTHROID, LEVOTHROID) 75 MCG tablet TAKE 1 TABLET (75 MCG TOTAL) BY MOUTH DAILY BEFORE BREAKFAST. 90 tablet 3  . lisinopril (PRINIVIL,ZESTRIL) 5 MG tablet Take 1 tablet (5 mg total) by mouth daily. 30 tablet 5  . meloxicam (MOBIC) 15 MG tablet TAKE 1 TABLET EVERY DAY 90 tablet 5  . metFORMIN (GLUCOPHAGE) 500 MG tablet TAKE 2 TABLETS IN THE MORNING  AND TAKE 2 TABLET IN THE EVENING WITH MEALS 120 tablet 5  . milk thistle 175 MG tablet Take 175 mg by mouth daily. Take QD    . Multiple Vitamins-Minerals (MULTIVITAMIN WITH MINERALS) tablet Take 1 tablet by mouth daily.    Marland Kitchen  pantoprazole (PROTONIX) 40 MG tablet Take 1 tablet (40 mg total) by mouth daily. 90 tablet 3  . pyridoxine (B-6) 100 MG tablet Take 100 mg by mouth daily. 2 in the am, and 2 in the evening    . simvastatin (ZOCOR) 20 MG tablet TAKE 1 TABLET EVERY EVENING (NEED MD APPOINTMENT) (Patient not taking: Reported on 10/24/2015) 15 tablet 0   No current facility-administered medications for this visit.    Allergies as of 10/24/2015  . (No Known Allergies)    Family History  Problem Relation Age of Onset  . Heart disease Mother   . Hypertension Mother   . Birth defects Daughter     Social  History   Social History  . Marital Status: Married    Spouse Name: N/A  . Number of Children: N/A  . Years of Education: N/A   Occupational History  . Not on file.   Social History Main Topics  . Smoking status: Never Smoker   . Smokeless tobacco: Never Used  . Alcohol Use: 0.0 oz/week    0 Standard drinks or equivalent per week     Comment: occ  . Drug Use: No  . Sexual Activity: Yes    Birth Control/ Protection: Surgical   Other Topics Concern  . Not on file   Social History Narrative    Review of Systems: General: Negative for anorexia, weight loss, fever, chills, fatigue, weakness. Eyes: Negative for vision changes.  ENT: Negative for hoarseness, difficulty swallowing. CV: Negative for chest pain, angina, palpitations, peripheral edema.  Respiratory: Negative for dyspnea at rest, cough, sputum, wheezing.  GI: See history of present illness. MS: Negative for joint pain, low back pain.  Derm: Negative for rash or itching.  Endo: Negative for unusual weight change.  Heme: Negative for bruising or bleeding. Allergy: Negative for rash or hives.    Physical Exam: BP 150/87 mmHg  Pulse 89  Temp(Src) 98.3 F (36.8 C) (Oral)  Ht 4' 11"  (1.499 m)  Wt 132 lb 12.8 oz (60.238 kg)  BMI 26.81 kg/m2 General:   Alert and oriented. Pleasant and cooperative. Well-nourished and well-developed.  Head:  Normocephalic and atraumatic. Eyes:  Without icterus, sclera clear and conjunctiva pink.  Ears:  Normal auditory acuity. Cardiovascular:  S1, S2 present without murmurs appreciated. Extremities without clubbing or edema. Respiratory:  Clear to auscultation bilaterally. No wheezes, rales, or rhonchi. No distress.  Gastrointestinal:  +BS, soft, and non-distended. Mild LLQ TTP. No HSM noted. No guarding or rebound. No masses appreciated.  Rectal:  Deferred  Neurologic:  Alert and oriented x4;  grossly normal neurologically. Psych:  Alert and cooperative. Normal mood and  affect.    10/24/2015 10:45 AM

## 2015-10-24 NOTE — Patient Instructions (Signed)
1. We will be schedule your procedure. 2. Return for follow-up as needed. 3. Further recommendations to be based on results of your procedure.

## 2015-10-24 NOTE — Assessment & Plan Note (Signed)
Patient currently due for routine screening colonoscopy. She had been told by blood donation that she was anemic however her CBC, although low end of normal, is still normal and does not demonstrate anemia. At this point she is still due for screening colonoscopy. She is generally asymptomatic from a GI standpoint except for some occasional/rare abdominal pain due to constipation which is relieved with over-the-counter stool softeners. She does take Xanax and Norco occasionally. We'll move forward with screening colonoscopy.  Proceed with TCS with 12.5 mg preprocedure Phenergan with Dr. Gala Romney in near future: the risks, benefits, and alternatives have been discussed with the patient in detail. The patient states understanding and desires to proceed.  The patient is not on any anticoagulants. She is on Xanax and Norco as needed. No antidepressants. We will provide for 12.5 mg preprocedure Phenergan. Otherwise conscious sedation should be adequate for her procedure

## 2015-10-25 LAB — HEPATIC FUNCTION PANEL
ALT: 77 IU/L — AB (ref 0–32)
AST: 67 IU/L — ABNORMAL HIGH (ref 0–40)
Albumin: 4.6 g/dL (ref 3.6–4.8)
Alkaline Phosphatase: 80 IU/L (ref 39–117)
BILIRUBIN TOTAL: 0.6 mg/dL (ref 0.0–1.2)
Bilirubin, Direct: 0.12 mg/dL (ref 0.00–0.40)
Total Protein: 7.5 g/dL (ref 6.0–8.5)

## 2015-10-25 LAB — TSH: TSH: 0.651 u[IU]/mL (ref 0.450–4.500)

## 2015-10-25 NOTE — Progress Notes (Signed)
cc'ed to pcp °

## 2015-10-26 ENCOUNTER — Other Ambulatory Visit: Payer: Self-pay | Admitting: *Deleted

## 2015-10-26 DIAGNOSIS — R748 Abnormal levels of other serum enzymes: Secondary | ICD-10-CM

## 2015-10-26 DIAGNOSIS — K760 Fatty (change of) liver, not elsewhere classified: Secondary | ICD-10-CM

## 2015-10-29 ENCOUNTER — Encounter: Payer: Self-pay | Admitting: Family Medicine

## 2015-10-29 DIAGNOSIS — R748 Abnormal levels of other serum enzymes: Secondary | ICD-10-CM | POA: Diagnosis not present

## 2015-10-29 DIAGNOSIS — K760 Fatty (change of) liver, not elsewhere classified: Secondary | ICD-10-CM | POA: Diagnosis not present

## 2015-10-31 ENCOUNTER — Ambulatory Visit (HOSPITAL_COMMUNITY): Admission: RE | Admit: 2015-10-31 | Payer: Commercial Managed Care - HMO | Source: Ambulatory Visit

## 2015-10-31 ENCOUNTER — Encounter: Payer: Self-pay | Admitting: Family Medicine

## 2015-10-31 LAB — HEPATITIS C ANTIBODY

## 2015-10-31 LAB — ANA: ANA: NEGATIVE

## 2015-10-31 LAB — SEDIMENTATION RATE: SED RATE: 8 mm/h (ref 0–40)

## 2015-10-31 LAB — CERULOPLASMIN: CERULOPLASMIN: 34.1 mg/dL (ref 19.0–39.0)

## 2015-10-31 LAB — HEPATITIS B SURFACE ANTIGEN: Hepatitis B Surface Ag: NEGATIVE

## 2015-11-03 ENCOUNTER — Ambulatory Visit (HOSPITAL_COMMUNITY)
Admission: RE | Admit: 2015-11-03 | Discharge: 2015-11-03 | Disposition: A | Discharge status: Home / Self Care | Payer: Commercial Managed Care - HMO | Source: Ambulatory Visit | Attending: Family Medicine | Admitting: Family Medicine

## 2015-11-03 DIAGNOSIS — K760 Fatty (change of) liver, not elsewhere classified: Secondary | ICD-10-CM | POA: Insufficient documentation

## 2015-11-03 DIAGNOSIS — R748 Abnormal levels of other serum enzymes: Secondary | ICD-10-CM | POA: Diagnosis not present

## 2015-11-03 DIAGNOSIS — R7989 Other specified abnormal findings of blood chemistry: Secondary | ICD-10-CM | POA: Diagnosis not present

## 2015-11-04 ENCOUNTER — Ambulatory Visit (HOSPITAL_COMMUNITY)
Admission: RE | Admit: 2015-11-04 | Discharge: 2015-11-04 | Disposition: A | Discharge status: Home / Self Care | Payer: Commercial Managed Care - HMO | Source: Ambulatory Visit | Attending: Internal Medicine | Admitting: Internal Medicine

## 2015-11-04 ENCOUNTER — Encounter (HOSPITAL_COMMUNITY): Payer: Self-pay | Admitting: *Deleted

## 2015-11-04 ENCOUNTER — Encounter (HOSPITAL_COMMUNITY)
Admission: RE | Disposition: A | Discharge status: Home / Self Care | Payer: Self-pay | Source: Ambulatory Visit | Attending: Internal Medicine

## 2015-11-04 DIAGNOSIS — Z1211 Encounter for screening for malignant neoplasm of colon: Secondary | ICD-10-CM

## 2015-11-04 DIAGNOSIS — E039 Hypothyroidism, unspecified: Secondary | ICD-10-CM | POA: Insufficient documentation

## 2015-11-04 DIAGNOSIS — Z7982 Long term (current) use of aspirin: Secondary | ICD-10-CM | POA: Diagnosis not present

## 2015-11-04 DIAGNOSIS — E785 Hyperlipidemia, unspecified: Secondary | ICD-10-CM | POA: Insufficient documentation

## 2015-11-04 HISTORY — PX: COLONOSCOPY: SHX5424

## 2015-11-04 LAB — GLUCOSE, CAPILLARY: GLUCOSE-CAPILLARY: 91 mg/dL (ref 65–99)

## 2015-11-04 SURGERY — COLONOSCOPY
Anesthesia: Moderate Sedation

## 2015-11-04 MED ORDER — MEPERIDINE HCL 100 MG/ML IJ SOLN
INTRAMUSCULAR | Status: AC
  Filled 2015-11-04: qty 2

## 2015-11-04 MED ORDER — PROMETHAZINE HCL 25 MG/ML IJ SOLN
INTRAMUSCULAR | Status: AC
  Filled 2015-11-04: qty 1

## 2015-11-04 MED ORDER — ONDANSETRON HCL 4 MG/2ML IJ SOLN
INTRAMUSCULAR | Status: AC
  Filled 2015-11-04: qty 2

## 2015-11-04 MED ORDER — MEPERIDINE HCL 100 MG/ML IJ SOLN
INTRAMUSCULAR | Status: DC | PRN
  Administered 2015-11-04: 50 mg via INTRAVENOUS

## 2015-11-04 MED ORDER — STERILE WATER FOR IRRIGATION IR SOLN
Status: DC | PRN
  Administered 2015-11-04: 12:00:00

## 2015-11-04 MED ORDER — MIDAZOLAM HCL 5 MG/5ML IJ SOLN
INTRAMUSCULAR | Status: DC | PRN
  Administered 2015-11-04: 2 mg via INTRAVENOUS
  Administered 2015-11-04: 1 mg via INTRAVENOUS

## 2015-11-04 MED ORDER — PROMETHAZINE HCL 25 MG/ML IJ SOLN
12.5000 mg | Freq: Once | INTRAMUSCULAR | Status: AC
  Administered 2015-11-04: 12.5 mg via INTRAVENOUS

## 2015-11-04 MED ORDER — ONDANSETRON HCL 4 MG/2ML IJ SOLN
INTRAMUSCULAR | Status: DC | PRN
Start: 2015-11-04 — End: 2015-11-04
  Administered 2015-11-04: 4 mg via INTRAVENOUS

## 2015-11-04 MED ORDER — MIDAZOLAM HCL 5 MG/5ML IJ SOLN
INTRAMUSCULAR | Status: AC
  Filled 2015-11-04: qty 10

## 2015-11-04 MED ORDER — SODIUM CHLORIDE 0.9 % IV SOLN
INTRAVENOUS | Status: DC
  Administered 2015-11-04: 11:00:00 via INTRAVENOUS

## 2015-11-04 MED ORDER — SODIUM CHLORIDE 0.9 % IJ SOLN
INTRAMUSCULAR | Status: AC
  Filled 2015-11-04: qty 3

## 2015-11-04 NOTE — H&P (View-Only) (Signed)
Primary Care Physician:  Sallee Lange, MD Primary Gastroenterologist:  Dr. Gala Romney  Chief Complaint  Patient presents with  . Colonoscopy    HPI:   65 year old female presents on referral from PCP for repeat screening colonoscopy. She was brought in for an office visit due to recent anemia. Last colonoscopy completed 05/30/2005 which found single anal papilla otherwise normal rectum, normal colon. Recommend repeat colonoscopy in 10 years. Noted iron deficiency anemia per PCP notes. PCP note reviewed. Patient tried to give blood 2 times and both times told she could not because of being anemic. Denied rectal bleeding at that time. Last CBC completed 09/16/2015 by PCP which noted borderline normal hemoglobin/hematocrit at 11.3/35.5. Follow-up Hemoccult cards were negative 3. Regardless, she is due for repeat screening colonoscopy.  Today she states she's doing well except she has a head cold. She was recently taken off statin due increased LFTs. Occasional abdominal pain due to constipation and trying to have a bowel movement which she manages with stool softeners. Not needed to take regularly, occasional/rare problem. Denies hematochezia, melena, N/V, unintentional weight loss, fever, chills. Denies chest pain, dyspnea, dizziness, lightheadedness, syncope, near syncope. Denies any other upper or lower GI symptoms.  Xanax "every once in a while I'll take a half." Takes Norco "not very often" and only takes a half when she needs it.   Past Medical History  Diagnosis Date  . Lipoma of neck   . Hypothyroidism   . Hyperlipidemia   . Fatty liver     Past Surgical History  Procedure Laterality Date  . Dilation and curettage of uterus    . Abdominal hysterectomy    . Colonoscopy  05/30/05    [id number] anal papilla, otherwise normal rectum.and colon  . Appendectomy      Current Outpatient Prescriptions  Medication Sig Dispense Refill  . acyclovir (ZOVIRAX) 400 MG tablet Take 1 tablet  (400 mg total) by mouth 5 (five) times daily. 25 tablet prn  . ALPRAZolam (XANAX) 0.25 MG tablet Take 1 tablet (0.25 mg total) by mouth 3 (three) times daily as needed for anxiety or sleep. 30 tablet 5  . aspirin 81 MG tablet Take 81 mg by mouth 2 (two) times daily.    . beta carotene w/minerals (OCUVITE) tablet Take 1 tablet by mouth daily.    Marland Kitchen docusate sodium (COLACE) 100 MG capsule Take 100 mg by mouth 2 (two) times daily.    Marland Kitchen estradiol (ESTRACE) 0.5 MG tablet Take 1 tablet (0.5 mg total) by mouth daily. 30 tablet 11  . fish oil-omega-3 fatty acids 1000 MG capsule Take 2 g by mouth daily.    Marland Kitchen HYDROcodone-acetaminophen (NORCO) 7.5-325 MG tablet 1/2 to 1  bid prn 40 tablet 0  . KRILL OIL PO Take 1 capsule by mouth daily.    Marland Kitchen levothyroxine (SYNTHROID, LEVOTHROID) 75 MCG tablet TAKE 1 TABLET (75 MCG TOTAL) BY MOUTH DAILY BEFORE BREAKFAST. 90 tablet 3  . lisinopril (PRINIVIL,ZESTRIL) 5 MG tablet Take 1 tablet (5 mg total) by mouth daily. 30 tablet 5  . meloxicam (MOBIC) 15 MG tablet TAKE 1 TABLET EVERY DAY 90 tablet 5  . metFORMIN (GLUCOPHAGE) 500 MG tablet TAKE 2 TABLETS IN THE MORNING  AND TAKE 2 TABLET IN THE EVENING WITH MEALS 120 tablet 5  . milk thistle 175 MG tablet Take 175 mg by mouth daily. Take QD    . Multiple Vitamins-Minerals (MULTIVITAMIN WITH MINERALS) tablet Take 1 tablet by mouth daily.    Marland Kitchen  pantoprazole (PROTONIX) 40 MG tablet Take 1 tablet (40 mg total) by mouth daily. 90 tablet 3  . pyridoxine (B-6) 100 MG tablet Take 100 mg by mouth daily. 2 in the am, and 2 in the evening    . simvastatin (ZOCOR) 20 MG tablet TAKE 1 TABLET EVERY EVENING (NEED MD APPOINTMENT) (Patient not taking: Reported on 10/24/2015) 15 tablet 0   No current facility-administered medications for this visit.    Allergies as of 10/24/2015  . (No Known Allergies)    Family History  Problem Relation Age of Onset  . Heart disease Mother   . Hypertension Mother   . Birth defects Daughter     Social  History   Social History  . Marital Status: Married    Spouse Name: N/A  . Number of Children: N/A  . Years of Education: N/A   Occupational History  . Not on file.   Social History Main Topics  . Smoking status: Never Smoker   . Smokeless tobacco: Never Used  . Alcohol Use: 0.0 oz/week    0 Standard drinks or equivalent per week     Comment: occ  . Drug Use: No  . Sexual Activity: Yes    Birth Control/ Protection: Surgical   Other Topics Concern  . Not on file   Social History Narrative    Review of Systems: General: Negative for anorexia, weight loss, fever, chills, fatigue, weakness. Eyes: Negative for vision changes.  ENT: Negative for hoarseness, difficulty swallowing. CV: Negative for chest pain, angina, palpitations, peripheral edema.  Respiratory: Negative for dyspnea at rest, cough, sputum, wheezing.  GI: See history of present illness. MS: Negative for joint pain, low back pain.  Derm: Negative for rash or itching.  Endo: Negative for unusual weight change.  Heme: Negative for bruising or bleeding. Allergy: Negative for rash or hives.    Physical Exam: BP 150/87 mmHg  Pulse 89  Temp(Src) 98.3 F (36.8 C) (Oral)  Ht 4' 11"  (1.499 m)  Wt 132 lb 12.8 oz (60.238 kg)  BMI 26.81 kg/m2 General:   Alert and oriented. Pleasant and cooperative. Well-nourished and well-developed.  Head:  Normocephalic and atraumatic. Eyes:  Without icterus, sclera clear and conjunctiva pink.  Ears:  Normal auditory acuity. Cardiovascular:  S1, S2 present without murmurs appreciated. Extremities without clubbing or edema. Respiratory:  Clear to auscultation bilaterally. No wheezes, rales, or rhonchi. No distress.  Gastrointestinal:  +BS, soft, and non-distended. Mild LLQ TTP. No HSM noted. No guarding or rebound. No masses appreciated.  Rectal:  Deferred  Neurologic:  Alert and oriented x4;  grossly normal neurologically. Psych:  Alert and cooperative. Normal mood and  affect.    10/24/2015 10:45 AM

## 2015-11-04 NOTE — Discharge Instructions (Signed)
°  Colonoscopy Discharge Instructions  Read the instructions outlined below and refer to this sheet in the next few weeks. These discharge instructions provide you with general information on caring for yourself after you leave the hospital. Your doctor may also give you specific instructions. While your treatment has been planned according to the most current medical practices available, unavoidable complications occasionally occur. If you have any problems or questions after discharge, call Dr. Gala Romney at [phone]. ACTIVITY  You may resume your regular activity, but move at a slower pace for the next 24 hours.   Take frequent rest periods for the next 24 hours.   Walking will help get rid of the air and reduce the bloated feeling in your belly (abdomen).   No driving for 24 hours (because of the medicine (anesthesia) used during the test).    Do not sign any important legal documents or operate any machinery for 24 hours (because of the anesthesia used during the test).  NUTRITION  Drink plenty of fluids.   You may resume your normal diet as instructed by your doctor.   Begin with a light meal and progress to your normal diet. Heavy or fried foods are harder to digest and may make you feel sick to your stomach (nauseated).   Avoid alcoholic beverages for 24 hours or as instructed.  MEDICATIONS  You may resume your normal medications unless your doctor tells you otherwise.  WHAT YOU CAN EXPECT TODAY  Some feelings of bloating in the abdomen.   Passage of more gas than usual.   Spotting of blood in your stool or on the toilet paper.  IF YOU HAD POLYPS REMOVED DURING THE COLONOSCOPY:  No aspirin products for 7 days or as instructed.   No alcohol for 7 days or as instructed.   Eat a soft diet for the next 24 hours.  FINDING OUT THE RESULTS OF YOUR TEST Not all test results are available during your visit. If your test results are not back during the visit, make an appointment  with your caregiver to find out the results. Do not assume everything is normal if you have not heard from your caregiver or the medical facility. It is important for you to follow up on all of your test results.  SEEK IMMEDIATE MEDICAL ATTENTION IF:  You have more than a spotting of blood in your stool.   Your belly is swollen (abdominal distention).   You are nauseated or vomiting.   You have a temperature over 101.   You have abdominal pain or discomfort that is severe or gets worse throughout the day.   I recommend one more screening colonoscopy in 10 years

## 2015-11-04 NOTE — Op Note (Signed)
[facility] [address] [place], [address]   COLONOSCOPY PROCEDURE REPORT  PATIENT: Nicole, Lam  MR#: [id number] BIRTHDATE: April 27, 1951 , 52  yrs. old GENDER: female ENDOSCOPIST: R.  Garfield Cornea, MD FACP [facility] REFERRED VA:[facility] Wolfgang Phoenix, M.D. PROCEDURE DATE:  12/04/2015 PROCEDURE:   Colonoscopy, screening INDICATIONS:Average risk colorectal cancer screening examination. MEDICATIONS: Versed 3 mg IV and Demerol 50 mg IV.  Phenergan 12.5 mg IV.  Zofran 4 mg IV. ASA CLASS:       Class II  CONSENT: The risks, benefits, alternatives and imponderables including but not limited to bleeding, perforation as well as the possibility of a missed lesion have been reviewed.  The potential for biopsy, lesion removal, etc. have also been discussed. Questions have been answered.  All parties agreeable.  Please see the history and physical in the medical record for more information.  DESCRIPTION OF PROCEDURE:   After the risks benefits and alternatives of the procedure were thoroughly explained, informed consent was obtained.  The digital rectal exam revealed no abnormalities of the rectum.   The [id number] ([id number])  endoscope was introduced through the anus and advanced to the cecum, which was identified by both the appendix and ileocecal valve. No adverse events experienced.   The quality of the prep was adequate  The instrument was then slowly withdrawn as the colon was fully examined. Estimated blood loss is zero unless otherwise noted in this procedure report.      COLON FINDINGS: Normal-appearing rectal mucosa.  Normal-appearing colonic mucosa.  Retroflexion was performed. .  Withdrawal time=7 minutes 0 seconds.  The scope was withdrawn and the procedure completed. COMPLICATIONS: There were no immediate complications.  ENDOSCOPIC IMPRESSION: Normal colonoscopy  RECOMMENDATIONS: Recommend 1 more screening examination in 10 years  eSigned:  R. Garfield Cornea, MD Rosalita Chessman [facility] 04-Dec-2015 12:41 PM   cc:  CPT CODES: ICD CODES:  The ICD and CPT codes recommended by this software are interpretations from the data that the clinical staff has captured with the software.  The verification of the translation of this report to the ICD and CPT codes and modifiers is the sole responsibility of the health care institution and practicing physician where this report was generated.  [employer] will not be held responsible for the validity of the ICD and CPT codes included on this report.  AMA assumes no liability for data contained or not contained herein. CPT is a Designer, television/film set of the [employer].

## 2015-11-04 NOTE — Interval H&P Note (Signed)
History and Physical Interval Note:  11/04/2015 12:17 PM  Nicole Lam  has presented today for surgery, with the diagnosis of screening colonoscopy  The various methods of treatment have been discussed with the patient and family. After consideration of risks, benefits and other options for treatment, the patient has consented to  Procedure(s) with comments: COLONOSCOPY (N/A) - 1230pm-moved to [address] to notify pt- as a surgical intervention .  The patient's history has been reviewed, patient examined, no change in status, stable for surgery.  I have reviewed the patient's chart and labs.  Questions were answered to the patient's satisfaction.     Nicole Lam   No change. Screening colonoscopy per plan.   The risks, benefits, limitations, alternatives and imponderables have been reviewed with the patient. Questions have been answered. All parties are agreeable.

## 2015-11-08 ENCOUNTER — Encounter (HOSPITAL_COMMUNITY): Payer: Self-pay | Admitting: Internal Medicine

## 2015-11-24 ENCOUNTER — Ambulatory Visit (INDEPENDENT_AMBULATORY_CARE_PROVIDER_SITE_OTHER): Payer: Commercial Managed Care - HMO | Admitting: Gastroenterology

## 2015-11-24 ENCOUNTER — Encounter: Payer: Self-pay | Admitting: Gastroenterology

## 2015-11-24 VITALS — BP 119/68 | HR 101 | Temp 98.2°F | Ht 59.0 in | Wt 135.8 lb

## 2015-11-24 DIAGNOSIS — K760 Fatty (change of) liver, not elsewhere classified: Secondary | ICD-10-CM

## 2015-11-24 DIAGNOSIS — R7989 Other specified abnormal findings of blood chemistry: Secondary | ICD-10-CM | POA: Diagnosis not present

## 2015-11-24 DIAGNOSIS — R945 Abnormal results of liver function studies: Secondary | ICD-10-CM | POA: Insufficient documentation

## 2015-11-24 DIAGNOSIS — K5900 Constipation, unspecified: Secondary | ICD-10-CM

## 2015-11-24 NOTE — Patient Instructions (Signed)
Please have your labs done. We will call within 5-7 business days with results.   Return to the office in 3 months.   Fatty Liver Fatty liver, also called hepatic steatosis or steatohepatitis, is a condition in which too much fat has built up in your liver cells. The liver removes harmful substances from your bloodstream. It produces fluids your body needs. It also helps your body use and store energy from the food you eat. In many cases, fatty liver does not cause symptoms or problems. It is often diagnosed when tests are being done for other reasons. However, over time, fatty liver can cause inflammation that may lead to more serious liver problems, such as scarring of the liver (cirrhosis). CAUSES  Causes of fatty liver may include:   Drinking too much alcohol.  Poor nutrition.  Obesity.  Cushing syndrome.  Diabetes.  Hyperlipidemia.  Pregnancy.  Certain drugs.  Poisons.  Some viral infections. RISK FACTORS You may be more likely to develop fatty liver if you:  Abuse alcohol.  Are pregnant.  Are overweight.  Have diabetes.  Have hepatitis.  Have a high triglyceride level.  SIGNS AND SYMPTOMS  Fatty liver often does not cause any symptoms. In cases where symptoms develop, they can include:  Fatigue.  Weakness.  Weight loss.  Confusion.   Abdominal pain.  Yellowing of your skin and the white parts of your eyes (jaundice).  Nausea and vomiting. DIAGNOSIS  Fatty liver may be diagnosed by:   Physical exam and medical history.  Blood tests.  Imaging tests, such as an ultrasound, CT scan, or MRI.  Liver biopsy. A small sample of liver tissue is removed using a needle. The sample is then looked at under a microscope. TREATMENT  Fatty liver is often caused by other health conditions. Treatment for fatty liver may involve medicines and lifestyle changes to manage conditions such as:   Alcoholism.  High cholesterol.  Diabetes.  Being overweight  or obese.  HOME CARE INSTRUCTIONS  Eat a healthy diet as directed by your health care provider.  Exercise regularly. This can help you lose weight and control your cholesterol and diabetes. Talk to your health care provider about an exercise plan and which activities are best for you.  Do not drink alcohol.   Take medicines only as directed by your health care provider. SEEK MEDICAL CARE IF: You have difficulty controlling your:  Blood sugar.  Cholesterol.  Alcohol consumption. SEEK IMMEDIATE MEDICAL CARE IF:  You have abdominal pain.  You have jaundice.  You have nausea and vomiting.   This information is not intended to replace advice given to you by your health care provider. Make sure you discuss any questions you have with your health care provider.   Document Released: 11/16/2005 Document Revised: 10/22/2014 Document Reviewed: 02/10/2014 Elsevier Interactive Patient Education 2016 Else[employer]  Nonalcoholic Fatty Liver Disease Diet Nonalcoholic fatty liver disease is a condition that causes fat to accumulate in and around the liver. The disease makes it harder for the liver to work the way that it should. Following a healthy diet can help to keep nonalcoholic fatty liver disease under control. It can also help to prevent or improve conditions that are associated with the disease, such as heart disease, diabetes, high blood pressure, and abnormal cholesterol levels. Along with regular exercise, this diet:  Promotes weight loss.  Helps to control blood sugar levels.  Helps to improve the way that the body uses insulin. WHAT DO I  NEED TO KNOW ABOUT THIS DIET?  Use the glycemic index (GI) to plan your meals. The index tells you how quickly a food will raise your blood sugar. Choose low-GI foods. These foods take a longer time to raise blood sugar.  Keep track of how many calories you take in. Eating the right amount of calories will help you to achieve a healthy  weight.  You may want to follow a Mediterranean diet. This diet includes a lot of vegetables, lean meats or fish, whole grains, fruits, and healthy oils and fats. WHAT FOODS CAN I EAT? Grains Whole grains, such as whole-wheat or whole-grain breads, crackers, tortillas, cereals, and pasta. Stone-ground whole wheat. Pumpernickel bread. Unsweetened oatmeal. Bulgur. Barley. Quinoa. Brown or wild rice. Corn or whole-wheat flour tortillas. Vegetables Lettuce. Spinach. Peas. Beets. Cauliflower. Cabbage. Broccoli. Carrots. Tomatoes. Squash. Eggplant. Herbs. Peppers. Onions. Cucumbers. Brussels sprouts. Yams and sweet potatoes. Beans. Lentils. Fruits Bananas. Apples. Oranges. Grapes. Papaya. Mango. Pomegranate. Kiwi. Grapefruit. Cherries. Meats and Other Protein Sources Seafood and shellfish. Lean meats. Poultry. Tofu. Dairy Low-fat or fat-free dairy products, such as yogurt, cottage cheese, and cheese. Beverages Water. Sugar-free drinks. Tea. Coffee. Low-fat or skim milk. Milk alternatives, such as soy or almond milk. Real fruit juice. Condiments Mustard. Relish. Low-fat, low-sugar ketchup and barbecue sauce. Low-fat or fat-free mayonnaise. Sweets and Desserts Sugar-free sweets. Fats and Oils Avocado. Canola or olive oil. Nuts and nut butters. Seeds. The items listed above may not be a complete list of recommended foods or beverages. Contact your dietitian for more options.  WHAT FOODS ARE NOT RECOMMENDED? Palm oil and coconut oil. Processed foods. Fried foods. Sweetened drinks, such as sweet tea, milkshakes, snow cones, iced sweet drinks, and sodas. Alcohol. Sweets. Foods that contain a lot of salt or sodium. The items listed above may not be a complete list of foods and beverages to avoid. Contact your dietitian for more information.   This information is not intended to replace advice given to you by your health care provider. Make sure you discuss any questions you have with your health care  provider.   Document Released: 02/15/2015 Document Reviewed: 02/15/2015 Elsevier Interactive Patient Education [employer].

## 2015-11-24 NOTE — Progress Notes (Signed)
Primary Care Physician: Sallee Lange, MD  Primary Gastroenterologist:  Garfield Cornea, MD   Chief Complaint  Patient presents with  . Follow-up    HPI: Nicole Lam is a 65 y.o. female here for further evaluation of abnormal ultrasound with elastography. She underwent a colonoscopy on 11/04/2015 for average risk colon cancer screening. She had a normal study. Next colonoscopy planned for January 2027. She presents back today for abnormal LFTs, elastography.  Sedimentation rate, ANA, ceruloplasmin, hepatitis B and C serologies all unremarkable. No evidence of hemachromatosis based on iron and ferritin labs. U/S showed mildly nodular left hepatic contour, hyperechoic hepatic parenchyma, metavir fibrosis score F2/F3. Patient states she has had minimally elevated LFTs for years. Recently numbers rose and she was instructed to stop her statin 08/2015. She has been on DM meds for couple of years. No significant weight change. No etoh. 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 No exercise. No FH of liver disease. No prior egd.   Patient feels well. No abdominal pain, fatigue, appetite concerns. Some constipation at times. She is using dulcolax about twice per week. No melena, brbpr.    Current Outpatient Prescriptions  Medication Sig Dispense Refill  . acyclovir (ZOVIRAX) 400 MG tablet Take 1 tablet (400 mg total) by mouth 5 (five) times daily. (Patient taking differently: Take 400 mg by mouth 5 (five) times daily. *Only takes as needed**) 25 tablet prn  . ALPRAZolam (XANAX) 0.25 MG tablet Take 1 tablet (0.25 mg total) by mouth 3 (three) times daily as needed for anxiety or sleep. 30 tablet 5  . aspirin 81 MG tablet Take 81 mg by mouth 2 (two) times daily.    . beta carotene w/minerals (OCUVITE) tablet Take 1 tablet by mouth daily.    Marland Kitchen docusate sodium (COLACE) 100 MG capsule Take 100 mg by mouth daily as needed for mild constipation or moderate constipation.     Marland Kitchen estradiol  (ESTRACE) 0.5 MG tablet Take 1 tablet (0.5 mg total) by mouth daily. 30 tablet 11  . KRILL OIL PO Take 1 capsule by mouth daily.    Marland Kitchen levothyroxine (SYNTHROID, LEVOTHROID) 75 MCG tablet TAKE 1 TABLET (75 MCG TOTAL) BY MOUTH DAILY BEFORE BREAKFAST. 90 tablet 3  . lisinopril (PRINIVIL,ZESTRIL) 5 MG tablet Take 1 tablet (5 mg total) by mouth daily. 30 tablet 5  . meloxicam (MOBIC) 15 MG tablet TAKE 1 TABLET EVERY DAY 90 tablet 5  . metFORMIN (GLUCOPHAGE) 500 MG tablet TAKE 2 TABLETS IN THE MORNING  AND TAKE 2 TABLET IN THE EVENING WITH MEALS 120 tablet 5  . milk thistle 175 MG tablet Take 350 mg by mouth daily.     . Multiple Vitamins-Minerals (MULTIVITAMIN WITH MINERALS) tablet Take 1 tablet by mouth daily.    . pantoprazole (PROTONIX) 40 MG tablet Take 1 tablet (40 mg total) by mouth daily. 90 tablet 3  . pyridoxine (B-6) 100 MG tablet Take 200 mg by mouth daily.      No current facility-administered medications for this visit.    Allergies as of 11/24/2015  . (No Known Allergies)   Past Medical History  Diagnosis Date  . Lipoma of neck   . Hypothyroidism   . Hyperlipidemia   . Fatty liver   . Diabetes [facility])    Past Surgical History  Procedure Laterality Date  . Dilation and curettage of uterus    . Abdominal hysterectomy    . Colonoscopy  05/30/05  [id number] anal papilla, otherwise normal rectum.and colon  . Appendectomy    . Colonoscopy N/A 11/04/2015    RMR: normal colonoscopy   Family History  Problem Relation Age of Onset  . Heart disease Mother   . Hypertension Mother   . Birth defects Daughter   . Colon cancer Neg Hx    Social History   Social History  . Marital Status: Married    Spouse Name: N/A  . Number of Children: N/A  . Years of Education: N/A   Social History Main Topics  . Smoking status: Never Smoker   . Smokeless tobacco: Never Used  . Alcohol Use: 0.0 oz/week    0 Standard drinks or equivalent per week     Comment: Once every 3-4 months will  have a drink  . Drug Use: No  . Sexual Activity: Yes    Birth Control/ Protection: Surgical   Other Topics Concern  . None   Social History Narrative    ROS:  General: Negative for anorexia, weight loss, fever, chills, fatigue, weakness. ENT: Negative for hoarseness, difficulty swallowing , nasal congestion. CV: Negative for chest pain, angina, palpitations, dyspnea on exertion, peripheral edema.  Respiratory: Negative for dyspnea at rest, dyspnea on exertion, cough, sputum, wheezing.  GI: See history of present illness. GU:  Negative for dysuria, hematuria, urinary incontinence, urinary frequency, nocturnal urination.  Endo: Negative for unusual weight change.    Physical Examination:   BP 119/68 mmHg  Pulse 101  Temp(Src) 98.2 F (36.8 C) (Oral)  Ht 4' 11"  (1.499 m)  Wt 135 lb 12.8 oz (61.598 kg)  BMI 27.41 kg/m2  General: Well-nourished, well-developed in no acute distress.  Eyes: No icterus. Mouth: Oropharyngeal mucosa moist and pink , no lesions erythema or exudate. Lungs: Clear to auscultation bilaterally.  Heart: Regular rate and rhythm, no murmurs rubs or gallops.  Abdomen: Bowel sounds are normal, nontender, nondistended, no hepatosplenomegaly or masses, no abdominal bruits or hernia , no rebound or guarding.   Extremities: No lower extremity edema. No clubbing or deformities. Neuro: Alert and oriented x 4   Skin: Warm and dry, no jaundice.   Psych: Alert and cooperative, normal mood and affect.  Labs:  Component     Latest Ref Rng 10/16/2013 10/16/2014 09/03/2015 10/24/2015  AST     0 - 40 IU/L 20 37 80 (H) 67 (H)  ALT     0 - 32 IU/L 27 41 (H) 67 (H) 77 (H)   Lab Results  Component Value Date   ALT 77* 10/24/2015   AST 67* 10/24/2015   ALKPHOS 80 10/24/2015   BILITOT 0.6 10/24/2015   Lab Results  Component Value Date   CREATININE 0.94 09/03/2015   BUN 17 09/03/2015   NA 141 09/03/2015   K 4.5 09/03/2015   CL 100 09/03/2015   CO2 25 09/03/2015    Lab Results  Component Value Date   WBC 5.0 09/16/2015   HGB 11.9* 09/16/2015   HCT 35.5 09/16/2015   MCV 82 09/16/2015   PLT 350 09/16/2015   Lab Results  Component Value Date   IRON 45 09/16/2015   TIBC 380 09/16/2015   FERRITIN 24 09/16/2015   Lab Results  Component Value Date   HGBA1C 7.4* 09/03/2015   Lab Results  Component Value Date   TSH 0.651 10/24/2015     See HPI. Imaging Studies: US Abdomen Complete W/elastography  11/03/2015  CLINICAL DATA:  Elevated LFTs EXAM: ULTRASOUND ABDOMEN COMPLETE ULTRASOUND HEPATIC  ELASTOGRAPHY TECHNIQUE: Sonography of the upper abdomen was performed. In addition, ultrasound elastography evaluation of the liver was performed. A region of interest was placed within the right lobe of the liver. Following application of a compressive sonographic pulse, shear waves were detected in the adjacent hepatic tissue and the shear wave velocity was calculated. Multiple assessments were performed at the selected site. Median shear wave velocity is correlated to a Metavir fibrosis score. COMPARISON:  None. FINDINGS: ULTRASOUND ABDOMEN Gallbladder: No gallstones, gallbladder wall thickening, or pericholecystic fluid. Negative sonographic Murphy's sign. Common bile duct: Diameter: 3 mm Liver: Hyperechoic hepatic parenchyma with mildly nodular left hepatic contour. No focal hepatic lesion is seen. IVC: No abnormality visualized. Pancreas: Visualized portion unremarkable. Spleen: Size and appearance within normal limits. Right Kidney: Length: 10.7 cm. No mass or hydronephrosis. Left Kidney: Length: 10.4 cm. No mass or hydronephrosis. Abdominal aorta: No aneurysm visualized. Other findings: None. ULTRASOUND HEPATIC ELASTOGRAPHY Device: Siemens Helix VTQ Patient position: Oblique Transducer 6C1 Number of measurements:  10 Hepatic Segment:  8 Median velocity:   2.00  m/sec IQR: 0.59 IQR/Median velocity ratio 0.295 Corresponding Metavir fibrosis score:  F2/F3 Risk of  fibrosis: Moderate Limitations of exam: None Pertinent findings noted on other imaging exams:  None Please note that abnormal shear wave velocities may also be identified in clinical settings other than with hepatic fibrosis, such as: acute hepatitis, elevated right heart and central venous pressures including use of beta blockers, veno-occlusive disease (Budd-Chiari), infiltrative processes such as mastocytosis/amyloidosis/infiltrative tumor, extrahepatic cholestasis, in the post-prandial state, and liver transplantation. Correlation with patient history, laboratory data, and clinical condition recommended. IMPRESSION: Hyperechoic hepatic parenchyma with mildly nodular hepatic contour, suggesting cirrhosis and/or hepatic steatosis. No focal hepatic lesion is seen. Median hepatic shear wave velocity is calculated at 2.00 m/sec. Corresponding Metavir fibrosis score is F2/F3. Risk of fibrosis is moderate. Follow-up:  Additional testing appropriate. Electronically Signed   By: Julian Hy M.D.   On: 11/03/2015 11:15

## 2015-11-24 NOTE — Assessment & Plan Note (Signed)
Mild constipation. Recommend miralax once daily at bedtime on days she does not have a BM.

## 2015-11-24 NOTE — Assessment & Plan Note (Signed)
65 y/o female with elevated transaminases, fatty liver with mildly nodular hepatic contour of left lobe, F2/F3 Metavir fibrosis score. Risk factors include obesity, diabetes, hyperlipidemia. We still need to exclude celiac disease, autoimmune disease completely but suspect [name]. Discussed need for tight control of diabetes, encouraged exercise and modest weight loss. We will be in touch once labs are available but plan to see her back in 3 months. Made patient aware of concern for progression of disease, can develop cirrhosis. Right now we do not have enough information to say she has cirrhosis.

## 2015-11-24 NOTE — Progress Notes (Signed)
cc'ed to pcp °

## 2015-11-26 DIAGNOSIS — R7989 Other specified abnormal findings of blood chemistry: Secondary | ICD-10-CM | POA: Diagnosis not present

## 2015-11-28 ENCOUNTER — Telehealth: Payer: Self-pay

## 2015-11-28 LAB — IGG, IGA, IGM
IgA/Immunoglobulin A, Serum: 198 mg/dL (ref 87–352)
IgG (Immunoglobin G), Serum: 1065 mg/dL (ref 700–1600)
IgM (Immunoglobulin M), Srm: 126 mg/dL (ref 26–217)

## 2015-11-28 LAB — MITOCHONDRIAL/SMOOTH MUSCLE AB PNL
Mitochondrial Ab: 12.9 Units (ref 0.0–20.0)
Smooth Muscle Ab: 10 Units (ref 0–19)

## 2015-11-28 NOTE — Telephone Encounter (Signed)
Immunoglobulins resulted and are in Epic. ( Others to follow)

## 2015-11-30 NOTE — Progress Notes (Signed)
Quick Note:  Labs unremarkable. Await celiac labs. ______

## 2015-12-02 LAB — TISSUE TRANSGLUTAMINASE, IGA: Transglutaminase IgA: <2 U/mL (ref 0–3)

## 2015-12-06 NOTE — Progress Notes (Signed)
Quick Note:  Celiac labs unremarkable. Likely fatty liver as etiology of her elevated liver numbers.  Instructions for fatty liver: Recommend 1-2# weight loss per week until ideal body weight through exercise & diet. Low fat/cholesterol diet.  Avoid sweets, sodas, fruit juices, sweetened beverages like tea, etc. Gradually increase exercise from 15 min daily up to 1 hr per day 5 days/week. Limit alcohol use.  Recheck LFTs in 10month. Office visit to follow. ______

## 2015-12-07 ENCOUNTER — Telehealth: Payer: Self-pay

## 2015-12-07 NOTE — Telephone Encounter (Signed)
Lab results from [place] placed on [place] for review

## 2015-12-13 ENCOUNTER — Other Ambulatory Visit: Payer: Self-pay | Admitting: Gastroenterology

## 2015-12-13 DIAGNOSIS — R945 Abnormal results of liver function studies: Secondary | ICD-10-CM

## 2015-12-13 DIAGNOSIS — R7989 Other specified abnormal findings of blood chemistry: Secondary | ICD-10-CM

## 2016-01-13 ENCOUNTER — Ambulatory Visit: Payer: Commercial Managed Care - HMO | Admitting: Family Medicine

## 2016-01-20 ENCOUNTER — Encounter: Payer: Self-pay | Admitting: Family Medicine

## 2016-01-20 ENCOUNTER — Ambulatory Visit (INDEPENDENT_AMBULATORY_CARE_PROVIDER_SITE_OTHER): Payer: Commercial Managed Care - HMO | Admitting: Family Medicine

## 2016-01-20 ENCOUNTER — Ambulatory Visit (HOSPITAL_COMMUNITY)
Admission: RE | Admit: 2016-01-20 | Discharge: 2016-01-20 | Disposition: A | Discharge status: Home / Self Care | Payer: Commercial Managed Care - HMO | Source: Ambulatory Visit | Attending: Family Medicine | Admitting: Family Medicine

## 2016-01-20 VITALS — BP 120/84 | Ht <= 58 in | Wt 135.2 lb

## 2016-01-20 DIAGNOSIS — M5136 Other intervertebral disc degeneration, lumbar region: Secondary | ICD-10-CM | POA: Diagnosis not present

## 2016-01-20 DIAGNOSIS — G8929 Other chronic pain: Secondary | ICD-10-CM

## 2016-01-20 DIAGNOSIS — M25551 Pain in right hip: Secondary | ICD-10-CM | POA: Insufficient documentation

## 2016-01-20 DIAGNOSIS — M47816 Spondylosis without myelopathy or radiculopathy, lumbar region: Secondary | ICD-10-CM | POA: Diagnosis not present

## 2016-01-20 DIAGNOSIS — E119 Type 2 diabetes mellitus without complications: Secondary | ICD-10-CM

## 2016-01-20 DIAGNOSIS — M549 Dorsalgia, unspecified: Secondary | ICD-10-CM | POA: Diagnosis not present

## 2016-01-20 DIAGNOSIS — K760 Fatty (change of) liver, not elsewhere classified: Secondary | ICD-10-CM | POA: Diagnosis not present

## 2016-01-20 DIAGNOSIS — E785 Hyperlipidemia, unspecified: Secondary | ICD-10-CM

## 2016-01-20 DIAGNOSIS — E039 Hypothyroidism, unspecified: Secondary | ICD-10-CM | POA: Diagnosis not present

## 2016-01-20 LAB — POCT GLYCOSYLATED HEMOGLOBIN (HGB A1C): Hemoglobin A1C: 6.4

## 2016-01-20 MED ORDER — HYDROCODONE-ACETAMINOPHEN 7.5-325 MG PO TABS: ORAL_TABLET | ORAL | Status: DC

## 2016-01-20 NOTE — Progress Notes (Addendum)
   Subjective:    Patient ID: Nicole Lam, female    DOB: [birth year], 65 y.o.   MRN: [id number]  Diabetes She presents for her follow-up diabetic visit. She has type 2 diabetes mellitus. There are no hypoglycemic associated symptoms. Pertinent negatives for hypoglycemia include no confusion. There are no diabetic associated symptoms. Pertinent negatives for diabetes include no chest pain, no fatigue, no polydipsia, no polyphagia and no weakness. There are no hypoglycemic complications. There are no diabetic complications. There are no known risk factors for coronary artery disease. Current diabetic treatment includes oral agent (monotherapy). She is compliant with treatment all of the time.   Patient has bilateral foot pain and itching.She describes intermittent itching She states occasionally should take Zyrtec it seems to help she denies any other particular troubles. Patient has right hip pain also. Patient states pain worse when she lays down and when she stands all day at work she states medication helps to some degree. Patient has history of elevated liver enzymes she is doing a good job watching her diet and trying to lose some weight. She is due to have her lab work repeated again next month She has hypothyroidism she takes her medication states her energy level okay She has hyperlipidemia she takes her medicine tries watch her diet appropriately Results for orders placed or performed in visit on 01/20/16  POCT glycosylated hemoglobin (Hb A1C)  Result Value Ref Range   Hemoglobin A1C 6.4     Review of Systems  Constitutional: Negative for activity change, appetite change and fatigue.  HENT: Negative for congestion.   Respiratory: Negative for cough.   Cardiovascular: Negative for chest pain.  Gastrointestinal: Negative for abdominal pain.  Endocrine: Negative for polydipsia and polyphagia.  Musculoskeletal: Positive for back pain and arthralgias.  Neurological: Negative for  weakness.  Psychiatric/Behavioral: Negative for confusion.       Objective:   Physical Exam  Constitutional: She appears well-nourished. No distress.  Cardiovascular: Normal rate, regular rhythm and normal heart sounds.   No murmur heard. Pulmonary/Chest: Effort normal and breath sounds normal. No respiratory distress.  Musculoskeletal: She exhibits no edema.  Lymphadenopathy:    She has no cervical adenopathy.  Neurological: She is alert. She exhibits normal muscle tone.  Psychiatric: Her behavior is normal.  Vitals reviewed.    25 minutes was spent with the patient. Greater than half the time was spent in discussion and answering questions and counseling regarding the issues that the patient came in for today.      Assessment & Plan:  Lumbar spine pain discomfort-gentle range of motion exercises also exercise sheet given plus also may continue mobic  Right hip pain x-rays ordered. Certainly if extensive or severe arthritic changes may need referral to orthopedics patient would prefer not to get orthopedics currently pain medication prescribed for home use use occasionally  Diabetes much improved continue current measures watch diet stay physically active  Elevated liver enzymes repeat lab work in May 2017  Hypothyroidism continue medication check TSH in May  Hyperlipidemia continue medication watch diet closely continue check lab work in May  Follow-up here in 6 months time  On diabetic foot exam patient does have bunions but no ulcers her sensation is good her pulses are good. She requests a prescription for diabetic shoes. This will be given. I am not sure she will qualify because I'm not finding evidence of neuropathy this will be up to her medical supplier communicating with her insurance.

## 2016-02-08 ENCOUNTER — Other Ambulatory Visit: Payer: Self-pay

## 2016-02-08 DIAGNOSIS — R945 Abnormal results of liver function studies: Secondary | ICD-10-CM

## 2016-02-08 DIAGNOSIS — R7989 Other specified abnormal findings of blood chemistry: Secondary | ICD-10-CM

## 2016-02-21 ENCOUNTER — Other Ambulatory Visit: Payer: Self-pay | Admitting: Family Medicine

## 2016-02-25 DIAGNOSIS — E039 Hypothyroidism, unspecified: Secondary | ICD-10-CM | POA: Diagnosis not present

## 2016-02-25 DIAGNOSIS — G8929 Other chronic pain: Secondary | ICD-10-CM | POA: Diagnosis not present

## 2016-02-25 DIAGNOSIS — M25551 Pain in right hip: Secondary | ICD-10-CM | POA: Diagnosis not present

## 2016-02-25 DIAGNOSIS — K760 Fatty (change of) liver, not elsewhere classified: Secondary | ICD-10-CM | POA: Diagnosis not present

## 2016-02-25 DIAGNOSIS — E785 Hyperlipidemia, unspecified: Secondary | ICD-10-CM | POA: Diagnosis not present

## 2016-02-25 DIAGNOSIS — M549 Dorsalgia, unspecified: Secondary | ICD-10-CM | POA: Diagnosis not present

## 2016-02-25 DIAGNOSIS — E119 Type 2 diabetes mellitus without complications: Secondary | ICD-10-CM | POA: Diagnosis not present

## 2016-02-26 LAB — TSH: TSH: 0.466 u[IU]/mL (ref 0.450–4.500)

## 2016-02-26 LAB — HEPATIC FUNCTION PANEL
ALT: 70 IU/L — AB (ref 0–32)
AST: 66 IU/L — ABNORMAL HIGH (ref 0–40)
Albumin: 4.4 g/dL (ref 3.6–4.8)
Alkaline Phosphatase: 67 IU/L (ref 39–117)
BILIRUBIN TOTAL: 0.5 mg/dL (ref 0.0–1.2)
Bilirubin, Direct: 0.1 mg/dL (ref 0.00–0.40)
Total Protein: 7.4 g/dL (ref 6.0–8.5)

## 2016-02-26 LAB — LIPID PANEL
CHOL/HDL RATIO: 6.2 ratio — AB (ref 0.0–4.4)
Cholesterol, Total: 302 mg/dL — ABNORMAL HIGH (ref 100–199)
HDL: 49 mg/dL (ref >39)
LDL CALC: 220 mg/dL — AB (ref 0–99)
Triglycerides: 163 mg/dL — ABNORMAL HIGH (ref 0–149)
VLDL CHOLESTEROL CAL: 33 mg/dL (ref 5–40)

## 2016-02-27 ENCOUNTER — Telehealth: Payer: Self-pay | Admitting: *Deleted

## 2016-02-27 NOTE — Telephone Encounter (Signed)
Pt would like rx for diabetic shoes sent to [facility]. Seen 01/20/16 for diabetes. Pt would like a call back after script has been faxed. Call cell after 4:30 pt does not have voicemail set up, or either home number and let husband know.

## 2016-02-28 ENCOUNTER — Other Ambulatory Visit: Payer: Self-pay | Admitting: Obstetrics and Gynecology

## 2016-02-28 DIAGNOSIS — Z1231 Encounter for screening mammogram for malignant neoplasm of breast: Secondary | ICD-10-CM

## 2016-03-05 ENCOUNTER — Encounter: Payer: Self-pay | Admitting: Gastroenterology

## 2016-03-05 ENCOUNTER — Ambulatory Visit (INDEPENDENT_AMBULATORY_CARE_PROVIDER_SITE_OTHER): Payer: Commercial Managed Care - HMO | Admitting: Gastroenterology

## 2016-03-05 VITALS — BP 135/72 | HR 106 | Temp 98.2°F | Ht <= 58 in | Wt 136.0 lb

## 2016-03-05 DIAGNOSIS — K760 Fatty (change of) liver, not elsewhere classified: Secondary | ICD-10-CM | POA: Diagnosis not present

## 2016-03-05 DIAGNOSIS — R945 Abnormal results of liver function studies: Secondary | ICD-10-CM

## 2016-03-05 DIAGNOSIS — R7989 Other specified abnormal findings of blood chemistry: Secondary | ICD-10-CM

## 2016-03-05 NOTE — Progress Notes (Signed)
CC'D TO PCP °

## 2016-03-05 NOTE — Progress Notes (Signed)
The patient had a question about an abd u/s she had done in 10/2015.  I told her she needed to speak with Dr. Lance Sell office about that order.  She voiced understanding and disconnected the call

## 2016-03-05 NOTE — Progress Notes (Signed)
Primary Care Physician: Sallee Lange, MD  Primary Gastroenterologist:  Garfield Cornea, MD   Chief Complaint  Patient presents with  . Abnormal Lab    elevated lfts    HPI: Nicole Lam is a 65 y.o. female here for follow-up of suspected [name]. Extensive evaluation as previously outlined. She did have abdominal ultrasound with elastography that showed hyperechoic hepatic parenchyma with mildly nodular hepatic contour, suggesting cirrhosis and/or hepatic steatosis. Metavir score F2/F3. Recent AST/ALT essentially unchanged. She has been off of Zocor since November 2016. Her LDL has risen from 117-220.  Clinically she feels well. Notes that she is unable to exercise, states that she stands on her feet for 9 hours a day and doesn't feel up to walking after she gets home. Last hemoglobin A1c 6.4. Denies abdominal pain. Bowel movements regular. No blood in the stool or melena. Congestion   Current Outpatient Prescriptions  Medication Sig Dispense Refill  . acyclovir (ZOVIRAX) 400 MG tablet Take 1 tablet (400 mg total) by mouth 5 (five) times daily. (Patient taking differently: Take 400 mg by mouth 5 (five) times daily. *Only takes as needed**) 25 tablet prn  . ALPRAZolam (XANAX) 0.25 MG tablet Take 1 tablet (0.25 mg total) by mouth 3 (three) times daily as needed for anxiety or sleep. 30 tablet 5  . aspirin 81 MG tablet Take 81 mg by mouth 2 (two) times daily.    . beta carotene w/minerals (OCUVITE) tablet Take 1 tablet by mouth daily.    Marland Kitchen docusate sodium (COLACE) 100 MG capsule Take 100 mg by mouth daily as needed for mild constipation or moderate constipation.     Marland Kitchen estradiol (ESTRACE) 0.5 MG tablet Take 1 tablet (0.5 mg total) by mouth daily. 30 tablet 11  . HYDROcodone-acetaminophen (NORCO) 7.5-325 MG tablet 1/2 to 1  bid prn 40 tablet 0  . KRILL OIL PO Take 1 capsule by mouth daily.    Marland Kitchen levothyroxine (SYNTHROID, LEVOTHROID) 75 MCG tablet TAKE 1 TABLET (75 MCG TOTAL) BY MOUTH  DAILY BEFORE BREAKFAST. 90 tablet 3  . lisinopril (PRINIVIL,ZESTRIL) 5 MG tablet Take 1 tablet (5 mg total) by mouth daily. 30 tablet 5  . meloxicam (MOBIC) 15 MG tablet TAKE 1 TABLET EVERY DAY 90 tablet 5  . metFORMIN (GLUCOPHAGE) 500 MG tablet TAKE 2 TABLETS IN THE MORNING  AND TAKE 2 TABLETS IN THE EVENING WITH MEALS 360 tablet 5  . milk thistle 175 MG tablet Take 350 mg by mouth daily.     . Multiple Vitamins-Minerals (MULTIVITAMIN WITH MINERALS) tablet Take 1 tablet by mouth daily.    . pantoprazole (PROTONIX) 40 MG tablet Take 1 tablet (40 mg total) by mouth daily. 90 tablet 3  . pyridoxine (B-6) 100 MG tablet Take 200 mg by mouth daily.      No current facility-administered medications for this visit.    Allergies as of 03/05/2016  . (No Known Allergies)    ROS:  General: Negative for anorexia, weight loss, fever, chills, fatigue, weakness. ENT: Negative for hoarseness, difficulty swallowing , nasal congestion. CV: Negative for chest pain, angina, palpitations, dyspnea on exertion, peripheral edema.  Respiratory: Negative for dyspnea at rest, dyspnea on exertion, cough, sputum, wheezing.  GI: See history of present illness. GU:  Negative for dysuria, hematuria, urinary incontinence, urinary frequency, nocturnal urination.  Endo: Negative for unusual weight change.    Physical Examination:   BP 135/72 mmHg  Pulse 106  Temp(Src) 98.2 F (36.8 C) (Oral)  Ht 4' 9"  (1.448 m)  Wt 136 lb (61.689 kg)  BMI 29.42 kg/m2  General: Well-nourished, well-developed in no acute distress.  Eyes: No icterus. Mouth: Oropharyngeal mucosa moist and pink , no lesions erythema or exudate. Lungs: Clear to auscultation bilaterally.  Heart: Regular rate and rhythm, no murmurs rubs or gallops.  Abdomen: Bowel sounds are normal, nontender, nondistended, no hepatosplenomegaly or masses, no abdominal bruits or hernia , no rebound or guarding.   Extremities: No lower extremity edema. No clubbing or  deformities. Neuro: Alert and oriented x 4   Skin: Warm and dry, no jaundice.   Psych: Alert and cooperative, normal mood and affect.  Labs:  Lab Results  Component Value Date   ALT 70* 02/25/2016   AST 66* 02/25/2016   ALKPHOS 67 02/25/2016   BILITOT 0.5 02/25/2016   Lab Results  Component Value Date   TSH 0.466 02/25/2016   Lab Results  Component Value Date   CHOL 302* 02/25/2016   HDL 49 02/25/2016   LDLCALC 220* 02/25/2016   TRIG 163* 02/25/2016   CHOLHDL 6.2* 02/25/2016    Imaging Studies: No results found.

## 2016-03-05 NOTE — Assessment & Plan Note (Signed)
65 year old female with mild elevation of transaminases, suspected fatty liver based on workup, ultrasound with mildly nodular hepatic contour, F2/F3 scores. No other evidence of portal hypertension or advanced liver disease. Clinically asymptomatic. Her LDL has doubled off of statin therapy over the past 6 months.  Suspect fatty liver with at least fibrosis, concern for possible early cirrhosis. We'll consider updated ultrasound in 6 months from the last which appear around July. She wants [place] to make sure that the last one has been covered as she was having some issues with insurance company. Once this has been resolved, we will work on scheduling upcoming ultrasound.  I will discuss further with Dr. Gala Romney. Continue to encourage finding a way to add in some daily exercise, weight loss. With regards to LDL management, usually would recommend use of low-dose pravastatin as opposed to other statins. Will discuss further workup and management with Dr. Gala Romney.

## 2016-03-05 NOTE — Patient Instructions (Signed)
1. Further recommendations to follow about your cholesterol medication and abnormal liver blood work once I discuss with Dr. Gala Romney.

## 2016-03-06 NOTE — Telephone Encounter (Signed)
Please fill in a prescription for diabetic shoes I will be happy to sign these. Once the prescription is signed it will need to be forwarded to air, so she can send this to [facility] along with a copy of her most recent office visit note from April. The patient has already been informed that we will send a send but it is possible that her insurance may not cover this because she does not have neuropathy but she does have bunions and diabetes

## 2016-03-07 ENCOUNTER — Other Ambulatory Visit: Payer: Self-pay

## 2016-03-07 DIAGNOSIS — Z79899 Other long term (current) drug therapy: Secondary | ICD-10-CM

## 2016-03-07 MED ORDER — PRAVASTATIN SODIUM 20 MG PO TABS: 20.0000 mg | ORAL_TABLET | Freq: Every day | ORAL | Status: DC

## 2016-03-07 NOTE — Telephone Encounter (Signed)
Notified husband that prescription will be faxed today.

## 2016-03-12 ENCOUNTER — Encounter: Payer: Self-pay | Admitting: Family Medicine

## 2016-03-21 ENCOUNTER — Emergency Department (HOSPITAL_COMMUNITY)
Admission: EM | Admit: 2016-03-21 | Discharge: 2016-03-21 | Disposition: A | Discharge status: Home / Self Care | Payer: Commercial Managed Care - HMO | Attending: Emergency Medicine | Admitting: Emergency Medicine

## 2016-03-21 ENCOUNTER — Emergency Department (HOSPITAL_COMMUNITY): Payer: Commercial Managed Care - HMO

## 2016-03-21 ENCOUNTER — Encounter (HOSPITAL_COMMUNITY): Payer: Self-pay | Admitting: Emergency Medicine

## 2016-03-21 DIAGNOSIS — I10 Essential (primary) hypertension: Secondary | ICD-10-CM | POA: Diagnosis not present

## 2016-03-21 DIAGNOSIS — R05 Cough: Secondary | ICD-10-CM | POA: Insufficient documentation

## 2016-03-21 DIAGNOSIS — Z7984 Long term (current) use of oral hypoglycemic drugs: Secondary | ICD-10-CM | POA: Insufficient documentation

## 2016-03-21 DIAGNOSIS — E119 Type 2 diabetes mellitus without complications: Secondary | ICD-10-CM | POA: Insufficient documentation

## 2016-03-21 DIAGNOSIS — M25562 Pain in left knee: Secondary | ICD-10-CM | POA: Insufficient documentation

## 2016-03-21 DIAGNOSIS — Z79899 Other long term (current) drug therapy: Secondary | ICD-10-CM | POA: Diagnosis not present

## 2016-03-21 DIAGNOSIS — Z7982 Long term (current) use of aspirin: Secondary | ICD-10-CM | POA: Diagnosis not present

## 2016-03-21 DIAGNOSIS — E785 Hyperlipidemia, unspecified: Secondary | ICD-10-CM | POA: Insufficient documentation

## 2016-03-21 MED ORDER — HYDROCODONE-ACETAMINOPHEN 5-325 MG PO TABS
1.0000 | ORAL_TABLET | Freq: Once | ORAL | Status: AC
  Administered 2016-03-21: 1 via ORAL
  Filled 2016-03-21: qty 1

## 2016-03-21 NOTE — ED Provider Notes (Signed)
CSN: [id number]     Arrival date & time 03/21/16  2039 History  By signing my name below, I, Hansel Feinstein and Tad Moore, attest that this documentation has been prepared under the direction and in the presence of Fredia Sorrow, MD. Electronically Signed: Hansel Feinstein and Tad Moore, ED Scribe. 03/21/2016. 10:14 PM.     Chief Complaint  Patient presents with  . Leg Pain    Patient is a 65 y.o. female presenting with leg pain. The history is provided by the patient. No language interpreter was used.  Leg Pain Location:  Knee Time since incident:  1 day Injury: no   Knee location:  L knee Pain details:    Quality:  Sharp and shooting   Radiates to:  Does not radiate   Severity:  Moderate   Onset quality:  Gradual   Timing:  Constant Chronicity:  Chronic Dislocation: no   Prior injury to area:  Yes Worsened by:  Bearing weight, flexion and activity Ineffective treatments:  Ice and elevation Associated symptoms: back pain ( baseline)   Associated symptoms: no fever, no muscle weakness, no neck pain, no numbness and no swelling     HPI Comments: Nicole Lam is a 65 y.o. female who presents to the Emergency Department complaining of gradual onset, acute on chronic left front and medial knee pain onset 2 days ago. The patient notes that the pain initially began years ago when she bent over and heard the knee pop, but denies any recent direct blow, trauma, injury or fall. Pt reports no relief with ice and elevation. She states her pain is worsened with weight-bearing, flexion, ambulation and with prolonged periods of standing. She rates her pain at 5/10 at rest and 10/10 at its worst. Pt also complains of cough at this time. She notes that she also experiences occasional lower back pain and exertional SOB, but states these symptoms are baseline and unchanged today. Pt denies fever, congestion, rhinorrhea, sore throat, visual disturbance, CP, leg swelling, abdominal pain, diarrhea,  nausea, vomiting, dysuria, hematuria, neck pain, rash, HA, bruising easily. She does not take any daily blood thinners.    Past Medical History  Diagnosis Date  . Lipoma of neck   . Hypothyroidism   . Hyperlipidemia   . Fatty liver   . Diabetes [facility])    Past Surgical History  Procedure Laterality Date  . Dilation and curettage of uterus    . Abdominal hysterectomy    . Colonoscopy  05/30/05    XNA:TFTDDU anal papilla, otherwise normal rectum.and colon  . Appendectomy    . Colonoscopy N/A 11/04/2015    RMR: normal colonoscopy   Family History  Problem Relation Age of Onset  . Heart disease Mother   . Hypertension Mother   . Birth defects Daughter   . Colon cancer Neg Hx   . Liver disease Neg Hx    Social History  Substance Use Topics  . Smoking status: Never Smoker   . Smokeless tobacco: Never Used  . Alcohol Use: No     Comment: Once every 3-4 months will have a drink   OB History    Gravida Para Term Preterm AB TAB SAB Ectopic Multiple Living   2 2 2       1      Review of Systems  Constitutional: Negative for fever.  HENT: Negative for congestion, rhinorrhea and sore throat.   Eyes: Negative for visual disturbance.  Respiratory: Positive for cough and shortness of  breath ( baseline).   Cardiovascular: Negative for chest pain and leg swelling.  Gastrointestinal: Negative for nausea, vomiting, abdominal pain and diarrhea.  Genitourinary: Negative for dysuria and hematuria.  Musculoskeletal: Positive for back pain ( baseline) and arthralgias (left knee). Negative for neck pain.  Skin: Negative for rash.  Neurological: Negative for weakness, numbness and headaches.  Hematological: Does not bruise/bleed easily.  Psychiatric/Behavioral: Negative for confusion.      Allergies  Review of patient's allergies indicates no known allergies.  Home Medications   Prior to Admission medications   Medication Sig Start Date End Date Taking? Authorizing Provider   ALPRAZolam (XANAX) 0.25 MG tablet Take 1 tablet (0.25 mg total) by mouth 3 (three) times daily as needed for anxiety or sleep. Patient taking differently: Take 0.125-0.25 mg by mouth 3 (three) times daily as needed for anxiety or sleep.  09/16/15  Yes Kathyrn Drown, MD  aspirin 81 MG tablet Take 81 mg by mouth at bedtime.    Yes Historical Provider, MD  beta carotene w/minerals (OCUVITE) tablet Take 1 tablet by mouth daily.   Yes Historical Provider, MD  estradiol (ESTRACE) 0.5 MG tablet Take 1 tablet (0.5 mg total) by mouth daily. 06/01/15  Yes [name], MD  HYDROcodone-acetaminophen ([place]) 7.5-325 MG tablet 1/2 to 1  bid prn Patient taking differently: Take 0.5 tablets by mouth 2 (two) times daily as needed for moderate pain or severe pain.  01/20/16  Yes Kathyrn Drown, MD  KRILL OIL PO Take 1 capsule by mouth daily.   Yes Historical Provider, MD  levothyroxine (SYNTHROID, LEVOTHROID) 75 MCG tablet TAKE 1 TABLET (75 MCG TOTAL) BY MOUTH DAILY BEFORE BREAKFAST. 09/16/15  Yes Kathyrn Drown, MD  lisinopril (PRINIVIL,ZESTRIL) 5 MG tablet Take 1 tablet (5 mg total) by mouth daily. 09/16/15  Yes Kathyrn Drown, MD  meloxicam (MOBIC) 15 MG tablet TAKE 1 TABLET EVERY DAY 06/02/15  Yes Kathyrn Drown, MD  metFORMIN (GLUCOPHAGE) 500 MG tablet TAKE 2 TABLETS IN THE MORNING  AND TAKE 2 TABLETS IN THE EVENING WITH MEALS 02/21/16  Yes Kathyrn Drown, MD  milk thistle 175 MG tablet Take 350 mg by mouth daily.    Yes Historical Provider, MD  naproxen sodium (ANAPROX) 220 MG tablet Take 440 mg by mouth once as needed (for pain).   Yes Historical Provider, MD  pantoprazole (PROTONIX) 40 MG tablet Take 1 tablet (40 mg total) by mouth daily. 09/16/15 09/15/16 Yes Kathyrn Drown, MD  pravastatin (PRAVACHOL) 20 MG tablet Take 1 tablet (20 mg total) by mouth daily. 03/07/16  Yes Kathyrn Drown, MD  pyridoxine (B-6) 100 MG tablet Take 200 mg by mouth daily.    Yes Historical Provider, MD  docusate sodium (COLACE) 100 MG  capsule Take 100 mg by mouth daily as needed for mild constipation or moderate constipation.     Historical Provider, MD   BP 189/88 mmHg  Pulse 74  Temp(Src) 97.8 F (36.6 C) (Oral)  Resp 20  Ht 4' 11"  (1.499 m)  Wt 61.689 kg  BMI 27.45 kg/m2  SpO2 100% Physical Exam  Constitutional: She appears well-developed and well-nourished.  HENT:  Head: Normocephalic.  Mouth/Throat: Oropharynx is clear and moist.  Eyes: Conjunctivae and EOM are normal. Pupils are equal, round, and reactive to light. No scleral icterus.  Cardiovascular: Normal rate, regular rhythm and normal heart sounds.   Left dorsalis pedis pulse and right dorsalis pedis pulse 1+  Pulmonary/Chest: Effort normal and  breath sounds normal. No respiratory distress.  Lungs CTA bilaterally.   Abdominal: Bowel sounds are normal. She exhibits no distension. There is no tenderness.  Musculoskeletal: Normal range of motion. She exhibits tenderness. She exhibits no edema.  No increased warmth to the left knee. Medial discomfort with mild swelling. No left calf or thigh tenderness. Pain with ROM. Kneecap not clinically dislocated.    Neurological: She is alert.  Skin: Skin is warm and dry.  Psychiatric: She has a normal mood and affect. Her behavior is normal.  Nursing note and vitals reviewed.   ED Course  Procedures (including critical care time) DIAGNOSTIC STUDIES: Oxygen Saturation is 100% on RA, normal by my interpretation.    COORDINATION OF CARE: 9:23 PM Discussed treatment plan with pt at bedside which includes X-ray and pt agreed to plan.   Labs Review Labs Reviewed - No data to display  Imaging Review Dg Knee Complete 4 Views Left  03/21/2016  CLINICAL DATA:  Knee pain, no known injury, initial encounter EXAM: LEFT KNEE - COMPLETE 4+ VIEW COMPARISON:  04/12/2007 FINDINGS: Mild medial joint space narrowing is noted. No acute fracture or dislocation is seen. No joint effusion or other soft tissue abnormality is noted.  IMPRESSION: Mild medial joint space narrowing.  No acute abnormality seen. Electronically Signed   By: Inez Catalina M.D.   On: 03/21/2016 22:09   I have personally reviewed and evaluated these images and lab results as part of my medical decision-making.   EKG Interpretation None      MDM   Final diagnoses:  Knee pain, acute, left    No traumatic injury to the left knee. X-rays without significant abnormalities. Will treat with knee immobilizer patient has pain medicine at home. Patient will follow-up with orthopedics work note provided.  I personally performed the services described in this documentation, which was scribed in my presence. The recorded information has been reviewed and is accurate.     Fredia Sorrow, MD 03/21/16 2250

## 2016-03-21 NOTE — ED Notes (Signed)
Pt c/o L knee and leg pain that started 2 days ago. Pt has been using ice and elevating the leg. Pt reports pain to the post knee with dorsiflexion.

## 2016-03-21 NOTE — Discharge Instructions (Signed)
Wear the knee immobilizer for your comfort is much as possible. He can appointment to follow-up with orthopedics referral information provided. Take pain medicine that you have at home. Work note provided.

## 2016-03-21 NOTE — ED Notes (Signed)
Explained to patient on how to remove and apply immobilizer. Patient aware that she is to keep immobilizer on per orders.

## 2016-03-22 ENCOUNTER — Telehealth: Payer: Self-pay | Admitting: Family Medicine

## 2016-03-22 ENCOUNTER — Telehealth: Payer: Self-pay | Admitting: Orthopaedic Surgery

## 2016-03-22 DIAGNOSIS — M25569 Pain in unspecified knee: Secondary | ICD-10-CM

## 2016-03-22 NOTE — Telephone Encounter (Signed)
Referral ordered in EPIC.

## 2016-03-22 NOTE — Telephone Encounter (Signed)
Please assist with [place] [place] referral please let the patient know if we need to expedite this please do so in the absence of [place]

## 2016-03-22 NOTE — Telephone Encounter (Signed)
Patient was seen last night at [facility] for her knee.  She says that they referred her to Dr. Ruthe Mannan office, but they need a referral from PCP.  Please advise.

## 2016-03-22 NOTE — Telephone Encounter (Signed)
Pt stopped in to check on this referral due to her pain and feeling she won't  Be able to go back to work. Wants to know if we can expedite the referral  For her   She is also willing to go to the ortho doc in [place] if that will make it quicker

## 2016-03-22 NOTE — Addendum Note (Signed)
Addended by: Dairl Ponder on: 03/22/2016 05:14 PM   Modules accepted: Orders

## 2016-03-22 NOTE — Telephone Encounter (Signed)
Patient called to discuss appointment scheduling for left knee pain, following [facility]visit 03/22/16. Offered appointment, and discussed insurance, which requires primary care provider to refer via online portal. Patient said will contact her doctor and request the referral.  Appointment pending.

## 2016-03-23 ENCOUNTER — Other Ambulatory Visit: Payer: Self-pay | Admitting: Family Medicine

## 2016-03-23 NOTE — Telephone Encounter (Signed)
Referral sent to Dr. Junius Argyle.

## 2016-03-23 NOTE — Telephone Encounter (Signed)
Called back to patient to relay authorization received, visits approved per [facility]. Appointment scheduled; patient aware.

## 2016-04-02 ENCOUNTER — Ambulatory Visit (INDEPENDENT_AMBULATORY_CARE_PROVIDER_SITE_OTHER): Payer: Commercial Managed Care - HMO | Admitting: Orthopedic Surgery

## 2016-04-02 VITALS — BP 129/74 | HR 98 | Ht 59.0 in | Wt 136.0 lb

## 2016-04-02 DIAGNOSIS — M25562 Pain in left knee: Secondary | ICD-10-CM

## 2016-04-02 NOTE — Patient Instructions (Signed)
Call Physical Therapy in [place] to arrange PT

## 2016-04-03 ENCOUNTER — Telehealth: Payer: Self-pay | Admitting: Orthopedic Surgery

## 2016-04-03 NOTE — Telephone Encounter (Signed)
[facility] called to say that Nicole Lam had contacted them to see if Dr. Aline Brochure had sent a referral for her to have physical therapy. They told the patient that Dr. Aline Brochure had not sent an order, but that they would contact our office to inquire.  Please advise

## 2016-04-04 NOTE — Progress Notes (Signed)
Chief Complaint  Patient presents with  . Follow-up    ER follow up on left knee pain from 03-21-16.   HPI 65 year old female comes in after emergency room evaluation on June 7 for atraumatic onset of sharp shooting nonradiating moderately severe constant left knee pain. Pain is worsened with weightbearing. She does not have catching locking giving way or any mechanical symptoms. She has had pain in the left knee for several years now.  ROS emergency department note dated 03/21/2016 Review of Systems  Constitutional: Negative for fever.  HENT: Negative for congestion, rhinorrhea and sore throat.   Eyes: Negative for visual disturbance.  Respiratory: Positive for cough and shortness of breath ( baseline).   Cardiovascular: Negative for chest pain and leg swelling.  Gastrointestinal: Negative for nausea, vomiting, abdominal pain and diarrhea.  Genitourinary: Negative for dysuria and hematuria.  Musculoskeletal: Positive for back pain ( baseline) and arthralgias (left knee). Negative for neck pain.  Skin: Negative for rash.  Neurological: Negative for weakness, numbness and headaches.  Hematological: Does not bruise/bleed easily.  Psychiatric/Behavioral: Negative for confusion.   Past Medical History  Diagnosis Date  . Lipoma of neck   . Hypothyroidism   . Hyperlipidemia   . Fatty liver   . Diabetes [facility])     Past Surgical History  Procedure Laterality Date  . Dilation and curettage of uterus    . Abdominal hysterectomy    . Colonoscopy  05/30/05    HEN:IDPOEU anal papilla, otherwise normal rectum.and colon  . Appendectomy    . Colonoscopy N/A 11/04/2015    RMR: normal colonoscopy   Family History  Problem Relation Age of Onset  . Heart disease Mother   . Hypertension Mother   . Birth defects Daughter   . Colon cancer Neg Hx   . Liver disease Neg Hx    Social History  Substance Use Topics  . Smoking status: Never Smoker   . Smokeless tobacco: Never Used  . Alcohol Use:  No     Comment: Once every 3-4 months will have a drink    Current outpatient prescriptions:  .  ALPRAZolam (XANAX) 0.25 MG tablet, Take 1 tablet (0.25 mg total) by mouth 3 (three) times daily as needed for anxiety or sleep. (Patient taking differently: Take 0.125-0.25 mg by mouth 3 (three) times daily as needed for anxiety or sleep. ), Disp: 30 tablet, Rfl: 5 .  aspirin 81 MG tablet, Take 81 mg by mouth at bedtime. , Disp: , Rfl:  .  beta carotene w/minerals (OCUVITE) tablet, Take 1 tablet by mouth daily., Disp: , Rfl:  .  docusate sodium (COLACE) 100 MG capsule, Take 100 mg by mouth daily as needed for mild constipation or moderate constipation. , Disp: , Rfl:  .  estradiol (ESTRACE) 0.5 MG tablet, Take 1 tablet (0.5 mg total) by mouth daily., Disp: 30 tablet, Rfl: 11 .  HYDROcodone-acetaminophen (NORCO) 7.5-325 MG tablet, 1/2 to 1  bid prn (Patient taking differently: Take 0.5 tablets by mouth 2 (two) times daily as needed for moderate pain or severe pain. ), Disp: 40 tablet, Rfl: 0 .  KRILL OIL PO, Take 1 capsule by mouth daily., Disp: , Rfl:  .  levothyroxine (SYNTHROID, LEVOTHROID) 75 MCG tablet, TAKE 1 TABLET (75 MCG TOTAL) BY MOUTH DAILY BEFORE BREAKFAST., Disp: 90 tablet, Rfl: 3 .  lisinopril (PRINIVIL,ZESTRIL) 5 MG tablet, TAKE 1 TABLET EVERY DAY, Disp: 90 tablet, Rfl: 1 .  meloxicam (MOBIC) 15 MG tablet, TAKE 1 TABLET EVERY  DAY, Disp: 90 tablet, Rfl: 5 .  metFORMIN (GLUCOPHAGE) 500 MG tablet, TAKE 2 TABLETS IN THE MORNING  AND TAKE 2 TABLETS IN THE EVENING WITH MEALS, Disp: 360 tablet, Rfl: 5 .  milk thistle 175 MG tablet, Take 350 mg by mouth daily. , Disp: , Rfl:  .  naproxen sodium (ANAPROX) 220 MG tablet, Take 440 mg by mouth once as needed (for pain)., Disp: , Rfl:  .  pantoprazole (PROTONIX) 40 MG tablet, Take 1 tablet (40 mg total) by mouth daily., Disp: 90 tablet, Rfl: 3 .  pravastatin (PRAVACHOL) 20 MG tablet, Take 1 tablet (20 mg total) by mouth daily., Disp: 30 tablet, Rfl:  4 .  pyridoxine (B-6) 100 MG tablet, Take 200 mg by mouth daily. , Disp: , Rfl:   BP 129/74 mmHg  Pulse 98  Ht 4' 11"  (1.499 m)  Wt 136 lb (61.689 kg)  BMI 27.45 kg/m2  Physical Exam  Constitutional: She is oriented to person, place, and time. She appears well-developed and well-nourished. No distress.  Cardiovascular: Normal rate and intact distal pulses.   Musculoskeletal:       Right knee: She exhibits no effusion.       Left knee: She exhibits no effusion.  Ambulatory status limp favoring left knee  Neurological: She is alert and oriented to person, place, and time. She has normal reflexes. She exhibits normal muscle tone. Coordination normal.  Skin: Skin is warm and dry. No rash noted. She is not diaphoretic. No erythema. No pallor.  Psychiatric: She has a normal mood and affect. Her behavior is normal. Judgment and thought content normal.    Right Knee Exam   Tenderness  The patient is experiencing no tenderness.     Range of Motion  The patient has normal right knee ROM.  Muscle Strength   The patient has normal right knee strength.  Tests  Drawer:       Anterior - negative      Other  Erythema: absent Sensation: normal Pulse: present Swelling: none Other tests: no effusion present   Left Knee Exam   Tenderness  The patient is experiencing tenderness in the medial joint line.  Range of Motion  Extension: abnormal  Flexion: abnormal   Muscle Strength   The patient has normal left knee strength.  Tests  [place]:  Medial - positive Lateral - negative Drawer:       Anterior - negative     Posterior - negative Varus: negative Valgus: negative  Other  Sensation: normal Pulse: present Swelling: none Effusion: no effusion present       ASSESSMENT: My personal interpretation of the images:  The patient has medial compartment arthrosis  The imaging studies were read as follows COMPARISON:  04/12/2007   FINDINGS: Mild medial joint space  narrowing is noted. No acute fracture or dislocation is seen. No joint effusion or other soft tissue abnormality is noted.   IMPRESSION: Mild medial joint space narrowing.  No acute abnormality seen.     Electronically Signed   By: Inez Catalina M.D.   On: 03/21/2016 22:09       PLAN Recommend physical therapy and then return for follow-up evaluation  Arther Abbott, MD 04/04/2016 7:37 AM

## 2016-04-05 NOTE — Telephone Encounter (Signed)
Order re-faxed to [facility]. Fax machine shows it went through.

## 2016-04-09 ENCOUNTER — Ambulatory Visit (HOSPITAL_COMMUNITY): Payer: Commercial Managed Care - HMO

## 2016-04-09 ENCOUNTER — Ambulatory Visit (HOSPITAL_COMMUNITY)
Admission: RE | Admit: 2016-04-09 | Discharge: 2016-04-09 | Disposition: A | Discharge status: Home / Self Care | Payer: Commercial Managed Care - HMO | Source: Ambulatory Visit | Attending: Obstetrics and Gynecology | Admitting: Obstetrics and Gynecology

## 2016-04-09 DIAGNOSIS — M25662 Stiffness of left knee, not elsewhere classified: Secondary | ICD-10-CM | POA: Diagnosis not present

## 2016-04-09 DIAGNOSIS — R262 Difficulty in walking, not elsewhere classified: Secondary | ICD-10-CM | POA: Diagnosis not present

## 2016-04-09 DIAGNOSIS — Z1231 Encounter for screening mammogram for malignant neoplasm of breast: Secondary | ICD-10-CM | POA: Diagnosis not present

## 2016-04-09 DIAGNOSIS — M25562 Pain in left knee: Secondary | ICD-10-CM | POA: Diagnosis not present

## 2016-04-09 DIAGNOSIS — M25552 Pain in left hip: Secondary | ICD-10-CM | POA: Diagnosis not present

## 2016-04-11 DIAGNOSIS — M25662 Stiffness of left knee, not elsewhere classified: Secondary | ICD-10-CM | POA: Diagnosis not present

## 2016-04-11 DIAGNOSIS — R262 Difficulty in walking, not elsewhere classified: Secondary | ICD-10-CM | POA: Diagnosis not present

## 2016-04-11 DIAGNOSIS — M25562 Pain in left knee: Secondary | ICD-10-CM | POA: Diagnosis not present

## 2016-04-11 DIAGNOSIS — M25552 Pain in left hip: Secondary | ICD-10-CM | POA: Diagnosis not present

## 2016-04-16 DIAGNOSIS — M25662 Stiffness of left knee, not elsewhere classified: Secondary | ICD-10-CM | POA: Diagnosis not present

## 2016-04-16 DIAGNOSIS — M25562 Pain in left knee: Secondary | ICD-10-CM | POA: Diagnosis not present

## 2016-04-16 DIAGNOSIS — M25552 Pain in left hip: Secondary | ICD-10-CM | POA: Diagnosis not present

## 2016-04-16 DIAGNOSIS — R262 Difficulty in walking, not elsewhere classified: Secondary | ICD-10-CM | POA: Diagnosis not present

## 2016-04-18 DIAGNOSIS — R262 Difficulty in walking, not elsewhere classified: Secondary | ICD-10-CM | POA: Diagnosis not present

## 2016-04-18 DIAGNOSIS — M25552 Pain in left hip: Secondary | ICD-10-CM | POA: Diagnosis not present

## 2016-04-18 DIAGNOSIS — M25662 Stiffness of left knee, not elsewhere classified: Secondary | ICD-10-CM | POA: Diagnosis not present

## 2016-04-18 DIAGNOSIS — M25562 Pain in left knee: Secondary | ICD-10-CM | POA: Diagnosis not present

## 2016-04-23 DIAGNOSIS — R262 Difficulty in walking, not elsewhere classified: Secondary | ICD-10-CM | POA: Diagnosis not present

## 2016-04-23 DIAGNOSIS — M25662 Stiffness of left knee, not elsewhere classified: Secondary | ICD-10-CM | POA: Diagnosis not present

## 2016-04-23 DIAGNOSIS — M25552 Pain in left hip: Secondary | ICD-10-CM | POA: Diagnosis not present

## 2016-04-23 DIAGNOSIS — M25562 Pain in left knee: Secondary | ICD-10-CM | POA: Diagnosis not present

## 2016-04-25 DIAGNOSIS — M25562 Pain in left knee: Secondary | ICD-10-CM | POA: Diagnosis not present

## 2016-04-25 DIAGNOSIS — M25552 Pain in left hip: Secondary | ICD-10-CM | POA: Diagnosis not present

## 2016-04-25 DIAGNOSIS — R262 Difficulty in walking, not elsewhere classified: Secondary | ICD-10-CM | POA: Diagnosis not present

## 2016-04-25 DIAGNOSIS — M25662 Stiffness of left knee, not elsewhere classified: Secondary | ICD-10-CM | POA: Diagnosis not present

## 2016-04-30 DIAGNOSIS — M25562 Pain in left knee: Secondary | ICD-10-CM | POA: Diagnosis not present

## 2016-04-30 DIAGNOSIS — M25552 Pain in left hip: Secondary | ICD-10-CM | POA: Diagnosis not present

## 2016-04-30 DIAGNOSIS — R262 Difficulty in walking, not elsewhere classified: Secondary | ICD-10-CM | POA: Diagnosis not present

## 2016-04-30 DIAGNOSIS — M25662 Stiffness of left knee, not elsewhere classified: Secondary | ICD-10-CM | POA: Diagnosis not present

## 2016-05-02 DIAGNOSIS — M25552 Pain in left hip: Secondary | ICD-10-CM | POA: Diagnosis not present

## 2016-05-02 DIAGNOSIS — M25662 Stiffness of left knee, not elsewhere classified: Secondary | ICD-10-CM | POA: Diagnosis not present

## 2016-05-02 DIAGNOSIS — M25562 Pain in left knee: Secondary | ICD-10-CM | POA: Diagnosis not present

## 2016-05-02 DIAGNOSIS — R262 Difficulty in walking, not elsewhere classified: Secondary | ICD-10-CM | POA: Diagnosis not present

## 2016-05-03 ENCOUNTER — Ambulatory Visit (INDEPENDENT_AMBULATORY_CARE_PROVIDER_SITE_OTHER): Payer: Commercial Managed Care - HMO | Admitting: Orthopedic Surgery

## 2016-05-03 VITALS — BP 122/66 | HR 91 | Ht 59.0 in | Wt 137.8 lb

## 2016-05-03 DIAGNOSIS — M25562 Pain in left knee: Secondary | ICD-10-CM

## 2016-05-03 NOTE — Progress Notes (Signed)
Follow-up after emergency room visit for a left knee pain she got an injection no significant trauma was noted at the time  She improved significantly with injection and physical therapy  She does complain of some lower back pain today  System review as noted from June 19 no change cough shortness of breath baseline back pain baseline arthralgias left knee  Today's exam shows free and easy motion of the left knee walking without support no limp knee is stable strength is normal skin is excellent condition sensation is normal distally good distal pulses  Right knee normal  She complained of some back pain in the left hip pain with radicular symptoms into the left knee straight leg raises were normal bilaterally reflexes were 2+ at the knee and 1+ on the left ankle 0 and the right ankle but she's had a prior Quiles repair  Impression improved right knee pain  Lower back pain stable  Follow-up as needed

## 2016-05-07 DIAGNOSIS — M25552 Pain in left hip: Secondary | ICD-10-CM | POA: Diagnosis not present

## 2016-05-07 DIAGNOSIS — M25562 Pain in left knee: Secondary | ICD-10-CM | POA: Diagnosis not present

## 2016-05-07 DIAGNOSIS — M25662 Stiffness of left knee, not elsewhere classified: Secondary | ICD-10-CM | POA: Diagnosis not present

## 2016-05-07 DIAGNOSIS — R262 Difficulty in walking, not elsewhere classified: Secondary | ICD-10-CM | POA: Diagnosis not present

## 2016-05-18 ENCOUNTER — Encounter: Payer: Self-pay | Admitting: Family Medicine

## 2016-05-18 ENCOUNTER — Telehealth: Payer: Self-pay | Admitting: Nurse Practitioner

## 2016-05-18 ENCOUNTER — Ambulatory Visit (INDEPENDENT_AMBULATORY_CARE_PROVIDER_SITE_OTHER): Payer: Commercial Managed Care - HMO | Admitting: Nurse Practitioner

## 2016-05-18 ENCOUNTER — Encounter: Payer: Self-pay | Admitting: Nurse Practitioner

## 2016-05-18 VITALS — BP 120/80 | Temp 98.0°F | Ht <= 58 in | Wt 137.4 lb

## 2016-05-18 DIAGNOSIS — M2669 Other specified disorders of temporomandibular joint: Secondary | ICD-10-CM

## 2016-05-18 DIAGNOSIS — J329 Chronic sinusitis, unspecified: Secondary | ICD-10-CM | POA: Diagnosis not present

## 2016-05-18 DIAGNOSIS — J310 Chronic rhinitis: Secondary | ICD-10-CM

## 2016-05-18 MED ORDER — AMOXICILLIN-POT CLAVULANATE 875-125 MG PO TABS: 1.0000 | ORAL_TABLET | Freq: Two times a day (BID) | ORAL | 0 refills | Status: DC

## 2016-05-18 MED ORDER — PREDNISONE 20 MG PO TABS: ORAL_TABLET | ORAL | 0 refills | Status: DC

## 2016-05-18 NOTE — Telephone Encounter (Signed)
I reviewed the ingredients. It should not be a problem unless it upsets your stomach with your other meds. Do not take Ibuprofen, Aleve or other similar meds while on steroids.

## 2016-05-18 NOTE — Telephone Encounter (Signed)
Left message to return call 

## 2016-05-18 NOTE — Telephone Encounter (Signed)
Pt wants to know if she can still take the alka seltzer plus with what you are giving her to combat this cold.   Please advise

## 2016-05-18 NOTE — Telephone Encounter (Signed)
Discussed with patient: Patient advised that Hoyle Sauer reviewed the ingredients. It should not be a problem unless it upsets your stomach with your other meds. Do not take Ibuprofen, Aleve or other similar meds while on steroids. Patient verbalized understanding.

## 2016-05-19 ENCOUNTER — Encounter: Payer: Self-pay | Admitting: Nurse Practitioner

## 2016-05-19 NOTE — Progress Notes (Signed)
Subjective:  Presents for c/o cough for about 3 weeks. Left sided facial area pain at frontal and maxillary area with headache and left sided sore throat x 3 d. No fever. Occasionally productive cough. Some yellow sputum. Left ear pain. No wheezing. Non smoker. No dental issues on left side.   Objective:   BP 120/80   Temp 98 F (36.7 C) (Oral)   Ht 4' 9.5" (1.461 m)   Wt 137 lb 6 oz (62.3 kg)   BMI 29.21 kg/m  NAD. Alert, oriented. TMs significant clear effusion; no erythema. Pharynx injected with green PND noted. Neck supple with anterior adenopathy. Lungs clear. Heart RRR. Distinct tenderness and slight swelling along left TMJ and lower left side of the face. Can open and close mouth without difficulty; no popping of the jaw.   Assessment: Rhinosinusitis  TMJ inflammation  Plan:  Meds ordered this encounter  Medications  . predniSONE (DELTASONE) 20 MG tablet    Sig: 3 po qd x 3 d then 2 po qd x 3 d then 1 po qd x 2 d    Dispense:  17 tablet    Refill:  0    Order Specific Question:   Supervising Provider    Answer:   Mikey Kirschner [[id number]]  . amoxicillin-clavulanate (AUGMENTIN) 875-125 MG tablet    Sig: Take 1 tablet by mouth 2 (two) times daily.    Dispense:  20 tablet    Refill:  0    Order Specific Question:   Supervising Provider    Answer:   Mikey Kirschner [[id number]]   OTC meds as directed. Call back in 7-10 days if no better, sooner if worse.

## 2016-05-28 ENCOUNTER — Ambulatory Visit (INDEPENDENT_AMBULATORY_CARE_PROVIDER_SITE_OTHER): Payer: Commercial Managed Care - HMO | Admitting: Obstetrics and Gynecology

## 2016-05-28 ENCOUNTER — Encounter: Payer: Self-pay | Admitting: Obstetrics and Gynecology

## 2016-05-28 DIAGNOSIS — Z79890 Hormone replacement therapy (postmenopausal): Secondary | ICD-10-CM

## 2016-05-28 DIAGNOSIS — Z90710 Acquired absence of both cervix and uterus: Secondary | ICD-10-CM

## 2016-05-28 DIAGNOSIS — Z01419 Encounter for gynecological examination (general) (routine) without abnormal findings: Secondary | ICD-10-CM | POA: Insufficient documentation

## 2016-05-28 NOTE — Progress Notes (Signed)
Assessment:  Annual Gyn Exam Hypercholesterolemia stable managed elsewhere Menopause management, on low-dose Estrace status post hysterectomy Plan:  1. pap smear not indicated this visit; next visit 1 year 2. return annually or prn 3    Annual mammogram advised; last mammogram was resulted on 04/16/16.  4.   Continue to use Estrace 0.5 mg as prescribed. Pros and cons of hormone replacement therapy are discussed in detail with the patient currently satisfied with continuing use Estrace will review it annually and give consideration to discontinuing that at age 64. Subjective:  Nicole Lam is a 65 y.o. female G2P2001 who presents for annual exam. No LMP recorded. Patient has had a hysterectomy. Cervix tubes and ovaries are gone The patient has no complaints today.   The following portions of the patient's history were reviewed and updated as appropriate: allergies, current medications, past family history, past medical history, past social history, past surgical history and problem list. Past Medical History:  Diagnosis Date  . Diabetes ([place])   . Fatty liver   . Hyperlipidemia   . Hypothyroidism   . Lipoma of neck     Past Surgical History:  Procedure Laterality Date  . ABDOMINAL HYSTERECTOMY    . APPENDECTOMY    . COLONOSCOPY  05/30/05   [id number] anal papilla, otherwise normal rectum.and colon  . COLONOSCOPY N/A 11/04/2015   RMR: normal colonoscopy  . DILATION AND CURETTAGE OF UTERUS       Current Outpatient Prescriptions:  .  ALPRAZolam (XANAX) 0.25 MG tablet, Take 1 tablet (0.25 mg total) by mouth 3 (three) times daily as needed for anxiety or sleep. (Patient taking differently: Take 0.125-0.25 mg by mouth 3 (three) times daily as needed for anxiety or sleep. ), Disp: 30 tablet, Rfl: 5 .  amoxicillin-clavulanate (AUGMENTIN) 875-125 MG tablet, Take 1 tablet by mouth 2 (two) times daily., Disp: 20 tablet, Rfl: 0 .  aspirin 81 MG tablet, Take 81 mg by mouth at bedtime. , Disp:  , Rfl:  .  beta carotene w/minerals (OCUVITE) tablet, Take 1 tablet by mouth daily., Disp: , Rfl:  .  docusate sodium (COLACE) 100 MG capsule, Take 100 mg by mouth daily as needed for mild constipation or moderate constipation. , Disp: , Rfl:  .  estradiol (ESTRACE) 0.5 MG tablet, Take 1 tablet (0.5 mg total) by mouth daily., Disp: 30 tablet, Rfl: 11 .  HYDROcodone-acetaminophen (NORCO) 7.5-325 MG tablet, 1/2 to 1  bid prn (Patient taking differently: Take 0.5 tablets by mouth 2 (two) times daily as needed for moderate pain or severe pain. ), Disp: 40 tablet, Rfl: 0 .  KRILL OIL PO, Take 1 capsule by mouth daily., Disp: , Rfl:  .  levothyroxine (SYNTHROID, LEVOTHROID) 75 MCG tablet, TAKE 1 TABLET (75 MCG TOTAL) BY MOUTH DAILY BEFORE BREAKFAST., Disp: 90 tablet, Rfl: 3 .  lisinopril (PRINIVIL,ZESTRIL) 5 MG tablet, TAKE 1 TABLET EVERY DAY, Disp: 90 tablet, Rfl: 1 .  meloxicam (MOBIC) 15 MG tablet, TAKE 1 TABLET EVERY DAY, Disp: 90 tablet, Rfl: 5 .  metFORMIN (GLUCOPHAGE) 500 MG tablet, TAKE 2 TABLETS IN THE MORNING  AND TAKE 2 TABLETS IN THE EVENING WITH MEALS, Disp: 360 tablet, Rfl: 5 .  naproxen sodium (ANAPROX) 220 MG tablet, Take 440 mg by mouth once as needed (for pain)., Disp: , Rfl:  .  pantoprazole (PROTONIX) 40 MG tablet, Take 1 tablet (40 mg total) by mouth daily., Disp: 90 tablet, Rfl: 3 .  pravastatin (PRAVACHOL) 20 MG tablet, Take 1  tablet (20 mg total) by mouth daily., Disp: 30 tablet, Rfl: 4 .  pyridoxine (B-6) 100 MG tablet, Take 200 mg by mouth daily. , Disp: , Rfl:   Review of Systems Constitutional: negative Gastrointestinal: negative Genitourinary: negative   Objective:  BP (!) 170/78 (BP Location: Right Arm, Patient Position: Sitting, Cuff Size: Normal)   Ht 4' 10.5" (1.486 m)   Wt 138 lb (62.6 kg)   BMI 28.35 kg/m    BMI: Body mass index is 28.35 kg/m.  General Appearance: Alert, appropriate appearance for age. No acute distress HEENT: Grossly normal Neck / Thyroid:  normal  Cardiovascular: RRR; normal S1, S2, no murmur Lungs: CTA bilaterally Back: No CVAT Breast Exam: No dimpling, nipple retraction or discharge. No masses or nodes., Normal to inspection, Normal breast tissue bilaterally and No masses or nodes. Good support, smooth, no masses, tissue moves well.  Gastrointestinal: Soft, non-tender, no masses or organomegaly Pelvic Exam: External genitalia: normal general appearance Vaginal: normal mucosa without prolapse or lesions and normal without tenderness, induration or masses. Somewhat atrophic but good support   Cervix: removed surgically Adnexa: removed surgically Uterus: removed surgically Rectovaginal: normal rectal, no masses and guaiac negative stool obtained Lymphatic Exam: Non-palpable nodes in neck, clavicular, axillary, or inguinal regions  Skin: no rash or abnormalities Neurologic: Normal gait and speech, no tremor  Psychiatric: Alert and oriented, appropriate affect.  Urinalysis:not indicated  Mallory Shirk. MD Pgr [phone] 9:35 AM   By signing my name below, I, [name], attest that this documentation has been prepared under the direction and in the presence of [name], MD. Electronically Signed: [name], Education administrator. 05/28/16. 9:53 AM.  I personally performed the services described in this documentation, which was SCRIBED in my presence. The recorded information has been reviewed and considered accurate. It has been edited as necessary during review. [name], MD

## 2016-06-14 DIAGNOSIS — H5200 Hypermetropia, unspecified eye: Secondary | ICD-10-CM | POA: Diagnosis not present

## 2016-06-14 DIAGNOSIS — E119 Type 2 diabetes mellitus without complications: Secondary | ICD-10-CM | POA: Diagnosis not present

## 2016-06-14 DIAGNOSIS — H5210 Myopia, unspecified eye: Secondary | ICD-10-CM | POA: Diagnosis not present

## 2016-06-20 ENCOUNTER — Other Ambulatory Visit: Payer: Self-pay | Admitting: Obstetrics and Gynecology

## 2016-06-29 ENCOUNTER — Other Ambulatory Visit: Payer: Self-pay | Admitting: Obstetrics and Gynecology

## 2016-07-05 ENCOUNTER — Other Ambulatory Visit: Payer: Self-pay | Admitting: *Deleted

## 2016-07-05 MED ORDER — ACYCLOVIR 400 MG PO TABS: 400.0000 mg | ORAL_TABLET | Freq: Every day | ORAL | 6 refills | Status: DC

## 2016-07-10 ENCOUNTER — Other Ambulatory Visit: Payer: Self-pay | Admitting: Family Medicine

## 2016-07-10 ENCOUNTER — Other Ambulatory Visit: Payer: Self-pay | Admitting: Obstetrics and Gynecology

## 2016-07-13 ENCOUNTER — Other Ambulatory Visit: Payer: Self-pay | Admitting: *Deleted

## 2016-07-16 MED ORDER — ESTRADIOL 0.5 MG PO TABS: 0.5000 mg | ORAL_TABLET | Freq: Every day | ORAL | 6 refills | Status: DC

## 2016-07-18 ENCOUNTER — Telehealth: Payer: Self-pay | Admitting: Family Medicine

## 2016-07-18 ENCOUNTER — Telehealth: Payer: Self-pay | Admitting: Cardiology

## 2016-07-18 NOTE — Telephone Encounter (Signed)
Call pt at 4:30 when she gets off of work. Call on mobile number.  Refills should be at pharm. Ask pt did she contact pharm.

## 2016-07-18 NOTE — Telephone Encounter (Signed)
Pt informed Estradiol refill on 07/13/2016 #90 with 11 refills.

## 2016-07-18 NOTE — Telephone Encounter (Signed)
Pt states she received both of these meds today in the mail.

## 2016-07-18 NOTE — Telephone Encounter (Signed)
Pt is requesting refills on pantoprazole (PROTONIX) 40 MG tablet meloxicam (MOBIC) 15 MG tablet [place], [place]

## 2016-08-02 ENCOUNTER — Other Ambulatory Visit: Payer: Self-pay | Admitting: *Deleted

## 2016-08-02 ENCOUNTER — Telehealth: Payer: Self-pay | Admitting: Family Medicine

## 2016-08-02 MED ORDER — PRAVASTATIN SODIUM 20 MG PO TABS: 20.0000 mg | ORAL_TABLET | Freq: Every day | ORAL | 0 refills | Status: DC

## 2016-08-02 NOTE — Telephone Encounter (Signed)
Requesting 90 day supply of Pravastatin.   [place]

## 2016-08-02 NOTE — Telephone Encounter (Signed)
Med sent to pharm. Pt notified.  

## 2016-08-07 ENCOUNTER — Telehealth: Payer: Self-pay | Admitting: Gastroenterology

## 2016-08-07 NOTE — Telephone Encounter (Signed)
On recall  °

## 2016-08-07 NOTE — Telephone Encounter (Signed)
Patient needs six month f/u with rmr for abnormal LFTs.

## 2016-08-28 ENCOUNTER — Other Ambulatory Visit: Payer: Self-pay | Admitting: Family Medicine

## 2016-08-30 ENCOUNTER — Telehealth: Payer: Self-pay | Admitting: Family Medicine

## 2016-08-30 DIAGNOSIS — M25562 Pain in left knee: Secondary | ICD-10-CM

## 2016-08-30 NOTE — Telephone Encounter (Signed)
ERROR

## 2016-08-30 NOTE — Telephone Encounter (Signed)
Left message return call 08/30/16

## 2016-08-30 NOTE — Telephone Encounter (Signed)
Pt called back, explained that doc "ok'd" referral for PT and I will send Pt verbalized understanding

## 2016-08-30 NOTE — Telephone Encounter (Signed)
Patient may have referral as requested in this note

## 2016-08-30 NOTE — Telephone Encounter (Signed)
Pt requesting order for PT for eval & treat left knee pain to be sent to [facility] in [place] Ph# [phone], Fx# [phone]  She was sent there by Dr. Aline Brochure earlier this year for the same left knee pain  She feels like she needs to go back to therapy and Dr. Aline Brochure can not see her until 09/11/16    If ok, please initiate in system so that I may process

## 2016-09-03 DIAGNOSIS — M791 Myalgia: Secondary | ICD-10-CM | POA: Diagnosis not present

## 2016-09-03 DIAGNOSIS — R262 Difficulty in walking, not elsewhere classified: Secondary | ICD-10-CM | POA: Diagnosis not present

## 2016-09-03 DIAGNOSIS — M25562 Pain in left knee: Secondary | ICD-10-CM | POA: Diagnosis not present

## 2016-09-03 DIAGNOSIS — M6281 Muscle weakness (generalized): Secondary | ICD-10-CM | POA: Diagnosis not present

## 2016-09-10 DIAGNOSIS — R262 Difficulty in walking, not elsewhere classified: Secondary | ICD-10-CM | POA: Diagnosis not present

## 2016-09-10 DIAGNOSIS — M25562 Pain in left knee: Secondary | ICD-10-CM | POA: Diagnosis not present

## 2016-09-10 DIAGNOSIS — M6281 Muscle weakness (generalized): Secondary | ICD-10-CM | POA: Diagnosis not present

## 2016-09-10 DIAGNOSIS — M791 Myalgia: Secondary | ICD-10-CM | POA: Diagnosis not present

## 2016-09-11 ENCOUNTER — Encounter: Payer: Self-pay | Admitting: Orthopedic Surgery

## 2016-09-11 ENCOUNTER — Ambulatory Visit (INDEPENDENT_AMBULATORY_CARE_PROVIDER_SITE_OTHER): Payer: Commercial Managed Care - HMO | Admitting: Orthopedic Surgery

## 2016-09-11 ENCOUNTER — Ambulatory Visit (INDEPENDENT_AMBULATORY_CARE_PROVIDER_SITE_OTHER): Payer: Commercial Managed Care - HMO

## 2016-09-11 DIAGNOSIS — G8929 Other chronic pain: Secondary | ICD-10-CM

## 2016-09-11 DIAGNOSIS — M25562 Pain in left knee: Secondary | ICD-10-CM

## 2016-09-11 NOTE — Patient Instructions (Signed)
You have received an injection of steroids into the joint. 15% of patients will have increased pain within the 24 hours postinjection.   This is transient and will go away.   We recommend that you use ice packs on the injection site for 20 minutes every 2 hours and extra strength Tylenol 2 tablets every 8 as needed until the pain resolves.  If you continue to have pain after taking the Tylenol and using the ice please call the office for further instructions.  Continue aleve  Apply heat  to the sore area

## 2016-09-11 NOTE — Progress Notes (Signed)
Patient ID: Nicole Lam, female   DOB: Mar 01, 1951, 65 y.o.   MRN: [id number]  Chief Complaint  Patient presents with  . Follow-up    left knee pain    HPI Nicole Lam is a 65 y.o. female.  H/o OA left knee; She squatted at work 3 weeks ago and felt acute intense pain in her left knee medial side also some pain across the front of the knee and occasional pain in the calf. She started physical therapy did well after the first visit and then the second visit she started having increased pain  She's taken Aleve twice a day occasional pain pill if he gets really bad she's worn a brace  She denies any mechanical symptoms  Review of Systems  Constitutional: Negative.   Respiratory: Negative.   Cardiovascular: Negative.     Review of Systems SEE HPI  Past Medical History:  Diagnosis Date  . Diabetes ([place])   . Fatty liver   . Hyperlipidemia   . Hypothyroidism   . Lipoma of neck     Past Surgical History:  Procedure Laterality Date  . ABDOMINAL HYSTERECTOMY    . APPENDECTOMY    . COLONOSCOPY  05/30/05   [id number] anal papilla, otherwise normal rectum.and colon  . COLONOSCOPY N/A 11/04/2015   RMR: normal colonoscopy  . DILATION AND CURETTAGE OF UTERUS      Family History  Problem Relation Age of Onset  . Heart disease Mother   . Hypertension Mother   . Birth defects Daughter   . Colon cancer Neg Hx   . Liver disease Neg Hx      Social History  Substance Use Topics  . Smoking status: Never Smoker  . Smokeless tobacco: Never Used  . Alcohol use No     Comment: Once every 3-4 months will have a drink    No Known Allergies  Current Outpatient Prescriptions  Medication Sig Dispense Refill  . acyclovir (ZOVIRAX) 400 MG tablet TAKE 1 TABLET BY MOUTH 5 TIMES DAILY 25 tablet 0  . acyclovir (ZOVIRAX) 400 MG tablet Take 1 tablet (400 mg total) by mouth 5 (five) times daily. 25 tablet 6  . ALPRAZolam (XANAX) 0.25 MG tablet Take 1 tablet (0.25 mg total) by mouth 3  (three) times daily as needed for anxiety or sleep. (Patient taking differently: Take 0.125-0.25 mg by mouth 3 (three) times daily as needed for anxiety or sleep. ) 30 tablet 5  . amoxicillin-clavulanate (AUGMENTIN) 875-125 MG tablet Take 1 tablet by mouth 2 (two) times daily. 20 tablet 0  . aspirin 81 MG tablet Take 81 mg by mouth at bedtime.     . beta carotene w/minerals (OCUVITE) tablet Take 1 tablet by mouth daily.    Marland Kitchen docusate sodium (COLACE) 100 MG capsule Take 100 mg by mouth daily as needed for mild constipation or moderate constipation.     Marland Kitchen estradiol (ESTRACE) 0.5 MG tablet TAKE 1 TABLET EVERY DAY. 90 tablet 11  . estradiol (ESTRACE) 0.5 MG tablet Take 1 tablet (0.5 mg total) by mouth daily. 30 tablet 6  . HYDROcodone-acetaminophen (NORCO) 7.5-325 MG tablet 1/2 to 1  bid prn (Patient taking differently: Take 0.5 tablets by mouth 2 (two) times daily as needed for moderate pain or severe pain. ) 40 tablet 0  . KRILL OIL PO Take 1 capsule by mouth daily.    Marland Kitchen levothyroxine (SYNTHROID, LEVOTHROID) 75 MCG tablet TAKE 1 TABLET EVERY DAY BEFORE BREAKFAST 30 tablet 0  .  lisinopril (PRINIVIL,ZESTRIL) 5 MG tablet TAKE 1 TABLET EVERY DAY 30 tablet 0  . meloxicam (MOBIC) 15 MG tablet TAKE 1 TABLET EVERY DAY 90 tablet 5  . metFORMIN (GLUCOPHAGE) 500 MG tablet TAKE 2 TABLETS IN THE MORNING  AND TAKE 2 TABLETS IN THE EVENING WITH MEALS 360 tablet 5  . naproxen sodium (ANAPROX) 220 MG tablet Take 440 mg by mouth once as needed (for pain).    . pantoprazole (PROTONIX) 40 MG tablet Take 1 tablet (40 mg total) by mouth daily. 90 tablet 3  . pravastatin (PRAVACHOL) 20 MG tablet Take 1 tablet (20 mg total) by mouth daily. 90 tablet 0  . pyridoxine (B-6) 100 MG tablet Take 200 mg by mouth daily.      No current facility-administered medications for this visit.        Physical Exam There were no vitals taken for this visit. Physical Exam Appearance, there are no abnormalities in terms of appearance  the patient was well-developed and well-nourished. The grooming and hygiene were normal.  Mental status orientation, there was normal alertness and orientation Mood pleasant Ambulatory status normal heel - toe gait   Ortho Exam Examination of the left KNEE.  Inspection  TENDERNESS IS NOTED OVER THE medial jointline and pes bursa  Range of motion 0-125  Tests for stability: CRUCIATE LIGAMENTS normal  , COLLATERAL LIGAMENTS normal  Motor strength  5 /5  Skin warm dry and intact without laceration or ulceration or erythema Neurologic examination normal sensation Vascular examination: normal DORSAL pulse with warm extremity and normal capillary refill  The opposite extremity normal rom stability and strength   Data Reviewed INDEPENDENT INTERPRETATION OF THE KNEE FILMS INCLUDE # 3 VIEWS: AND MY READING IS mild arthritis medial compartment no acute fracture  Assessment  Encounter Diagnosis  Name Primary?  . Acute pain of left knee Yes    Procedure note left knee injection verbal consent was obtained to inject left knee joint  Timeout was completed to confirm the site of injection  The medications used were 40 mg of Depo-Medrol and 1% lidocaine 3 cc  Anesthesia was provided by ethyl chloride and the skin was prepped with alcohol.  After cleaning the skin with alcohol a 20-gauge needle was used to inject the left knee joint. There were no complications. A sterile bandage was applied.     Plan  Continue aleve Apply heat to the hamstrings  Return 5 weeks

## 2016-09-12 ENCOUNTER — Telehealth: Payer: Self-pay | Admitting: Family Medicine

## 2016-09-12 DIAGNOSIS — E785 Hyperlipidemia, unspecified: Secondary | ICD-10-CM

## 2016-09-12 DIAGNOSIS — M6281 Muscle weakness (generalized): Secondary | ICD-10-CM | POA: Diagnosis not present

## 2016-09-12 DIAGNOSIS — E119 Type 2 diabetes mellitus without complications: Secondary | ICD-10-CM

## 2016-09-12 DIAGNOSIS — M791 Myalgia: Secondary | ICD-10-CM | POA: Diagnosis not present

## 2016-09-12 DIAGNOSIS — R262 Difficulty in walking, not elsewhere classified: Secondary | ICD-10-CM | POA: Diagnosis not present

## 2016-09-12 DIAGNOSIS — M25562 Pain in left knee: Secondary | ICD-10-CM | POA: Diagnosis not present

## 2016-09-12 IMAGING — DX DG KNEE COMPLETE 4+V*L*
4 series · 4 of 4 positions shown · non-contrast
Comparison: 04/12/2007

CLINICAL DATA: Knee pain, no known injury, initial encounter

EXAM:
LEFT KNEE - COMPLETE 4+ VIEW

[knee ap (1 of 3)]
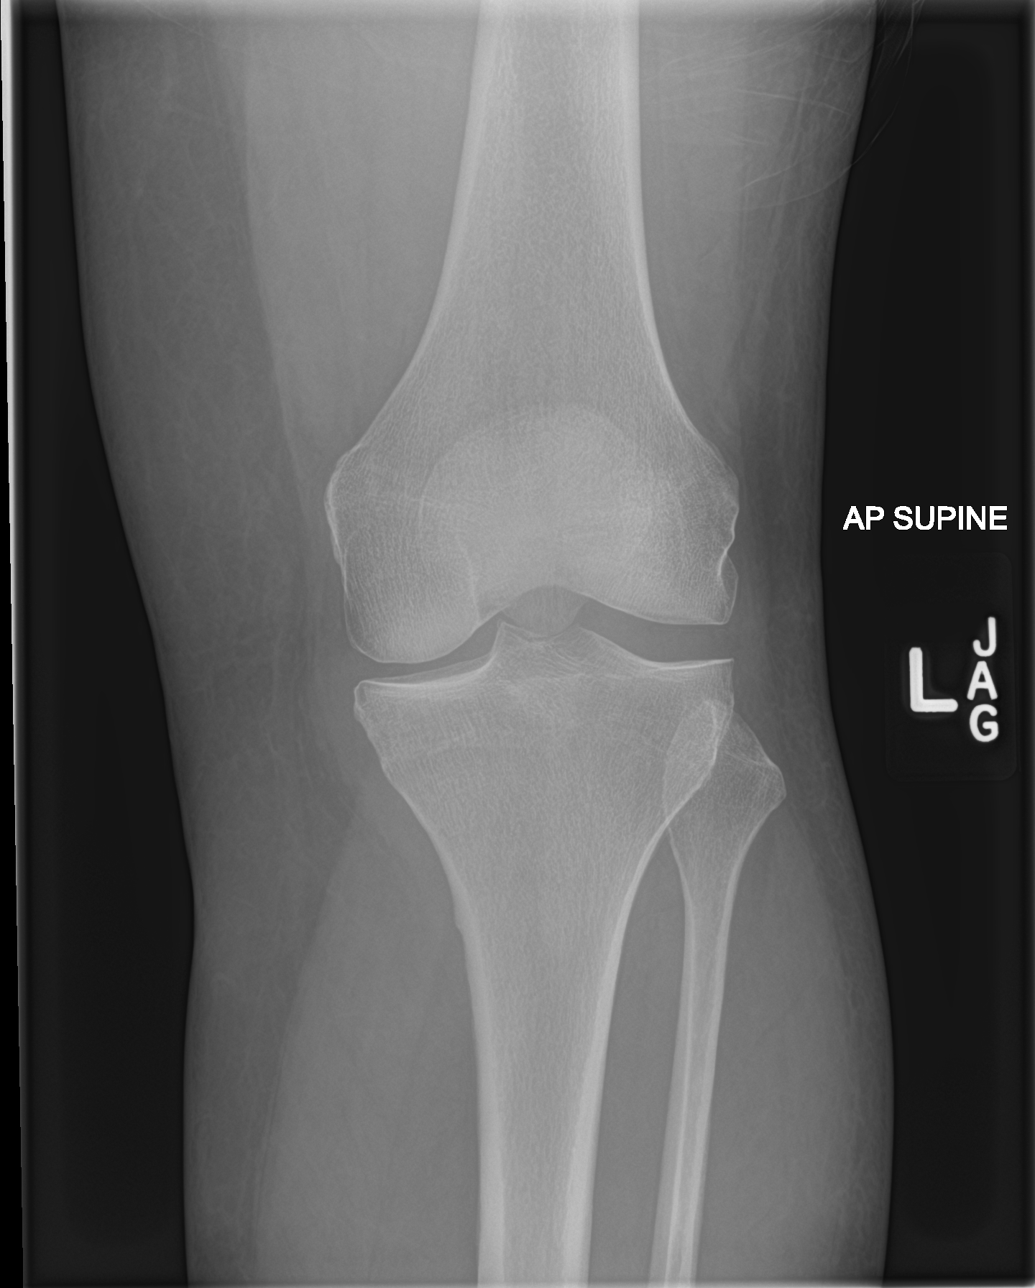

[knee ap (2 of 3)]
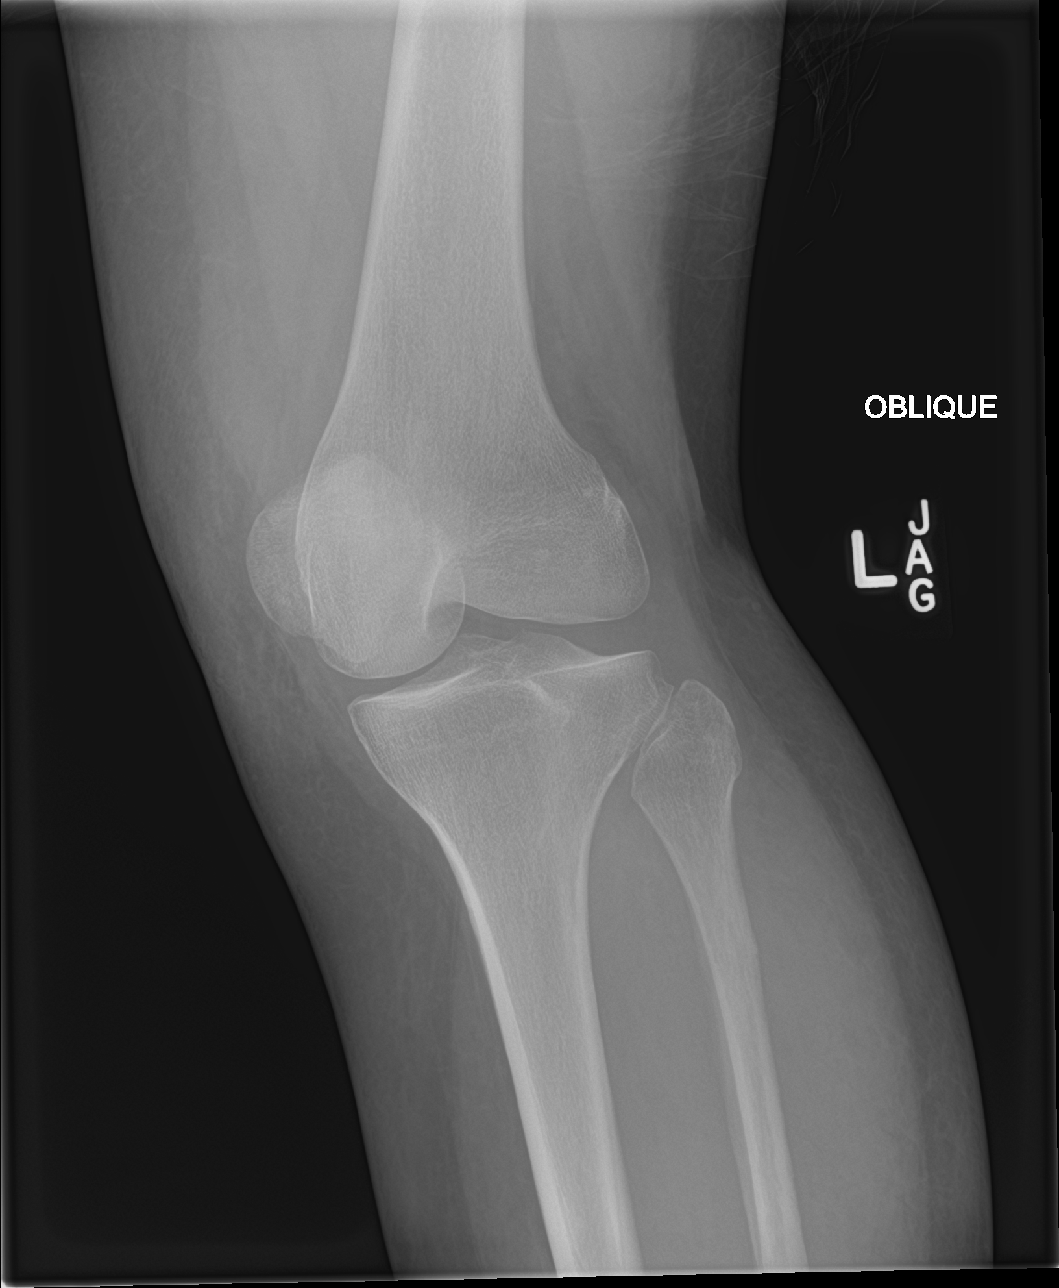

[knee ap (3 of 3)]
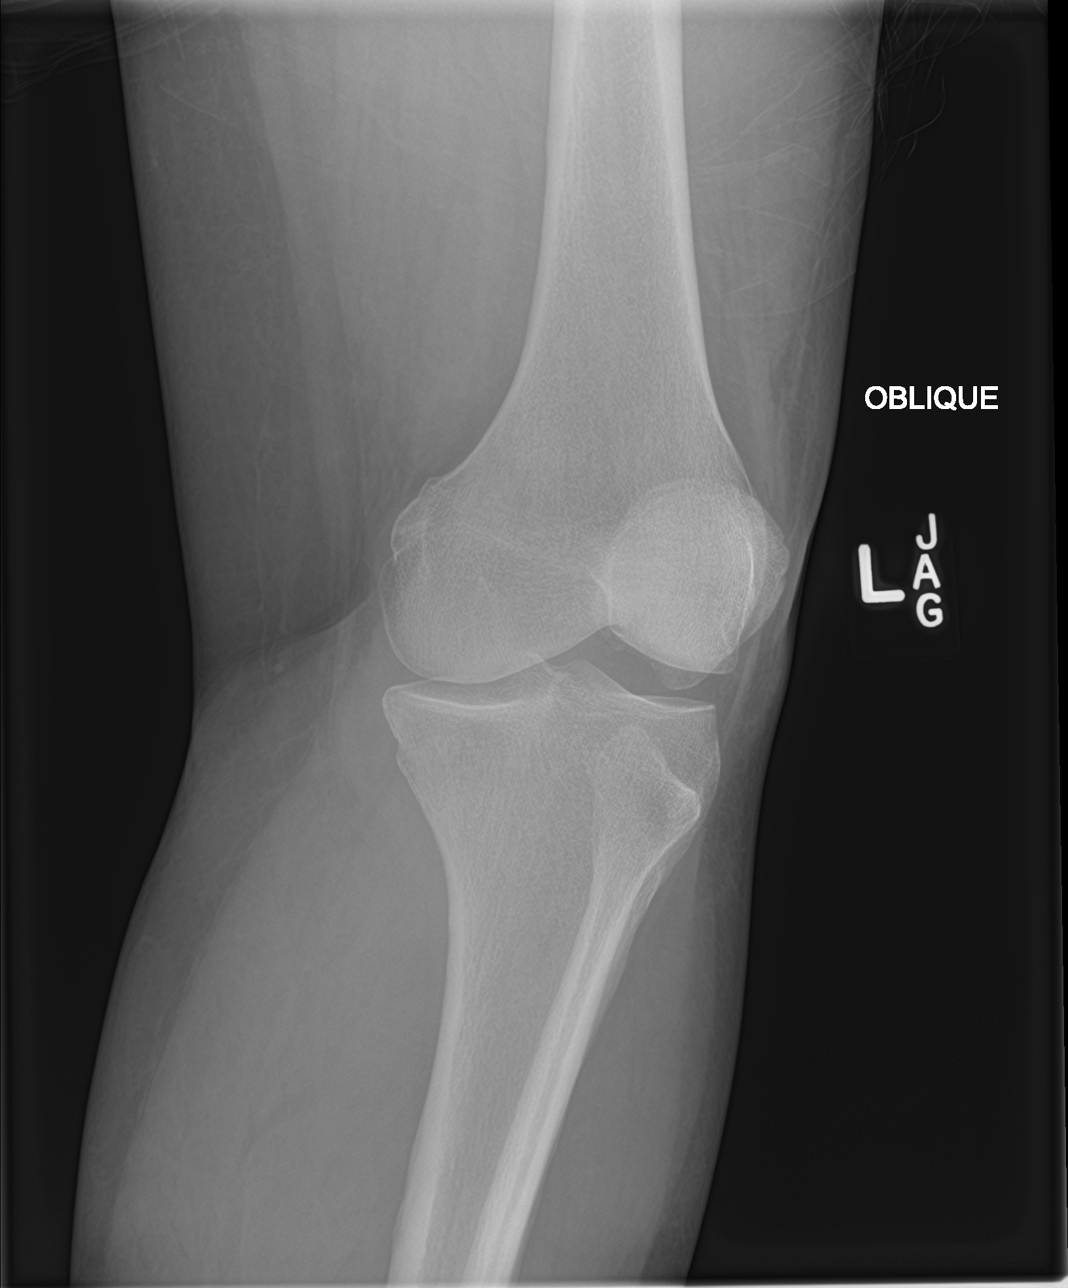

[knee lat]
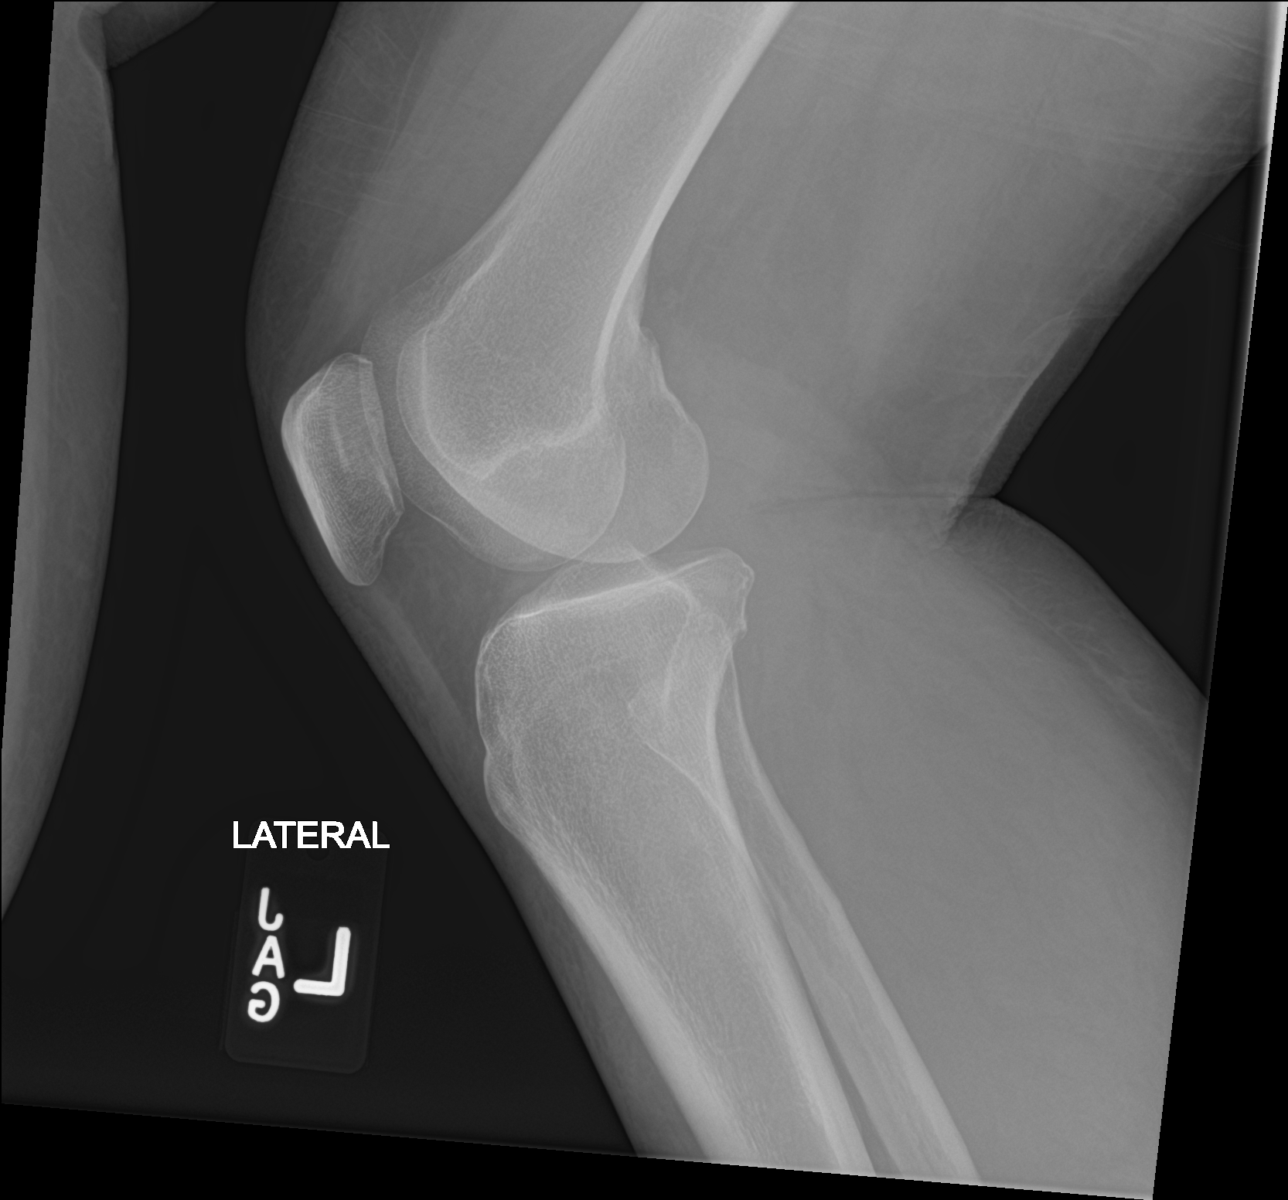

[4 of 4 positions shown; findings below may reference images not displayed]

FINDINGS: Mild medial joint space narrowing is noted. No acute fracture or
dislocation is seen. No joint effusion or other soft tissue
abnormality is noted.
IMPRESSION: Mild medial joint space narrowing.  No acute abnormality seen.

## 2016-09-12 NOTE — Telephone Encounter (Signed)
Pt is requesting lab orders to be sent over for an upcoming appt. Last labs per epic were: tsh,hepatic,and lipid on 02/25/16. Pt is wanting to get these done on Sat. If possible.

## 2016-09-12 NOTE — Telephone Encounter (Signed)
Lipid, liver, metabolic 7, hemoglobin K1I, urine ACR

## 2016-09-13 NOTE — Telephone Encounter (Signed)
Left message return call 09/13/16

## 2016-09-13 NOTE — Telephone Encounter (Signed)
Spoke with patient and informed her per [place] ordered for upcoming appointment. Informed patient to be fasting. Patient verbalized understanding.

## 2016-09-15 DIAGNOSIS — E785 Hyperlipidemia, unspecified: Secondary | ICD-10-CM | POA: Diagnosis not present

## 2016-09-15 DIAGNOSIS — E119 Type 2 diabetes mellitus without complications: Secondary | ICD-10-CM | POA: Diagnosis not present

## 2016-09-16 LAB — LIPID PANEL
CHOLESTEROL TOTAL: 212 mg/dL — AB (ref 100–199)
Chol/HDL Ratio: 4.6 ratio units — ABNORMAL HIGH (ref 0.0–4.4)
HDL: 46 mg/dL (ref >39)
LDL Calculated: 147 mg/dL — ABNORMAL HIGH (ref 0–99)
TRIGLYCERIDES: 96 mg/dL (ref 0–149)
VLDL Cholesterol Cal: 19 mg/dL (ref 5–40)

## 2016-09-16 LAB — BASIC METABOLIC PANEL
BUN / CREAT RATIO: 18 (ref 12–28)
BUN: 15 mg/dL (ref 8–27)
CO2: 25 mmol/L (ref 18–29)
CREATININE: 0.85 mg/dL (ref 0.57–1.00)
Calcium: 9.5 mg/dL (ref 8.7–10.3)
Chloride: 98 mmol/L (ref 96–106)
GFR calc Af Amer: 83 mL/min/{1.73_m2} (ref >59)
GFR calc non Af Amer: 72 mL/min/{1.73_m2} (ref >59)
GLUCOSE: 163 mg/dL — AB (ref 65–99)
POTASSIUM: 4.8 mmol/L (ref 3.5–5.2)
SODIUM: 140 mmol/L (ref 134–144)

## 2016-09-16 LAB — HEPATIC FUNCTION PANEL
ALK PHOS: 73 IU/L (ref 39–117)
ALT: 81 IU/L — ABNORMAL HIGH (ref 0–32)
AST: 95 IU/L — AB (ref 0–40)
Albumin: 4.5 g/dL (ref 3.6–4.8)
BILIRUBIN, DIRECT: 0.15 mg/dL (ref 0.00–0.40)
Bilirubin Total: 0.6 mg/dL (ref 0.0–1.2)
TOTAL PROTEIN: 7.4 g/dL (ref 6.0–8.5)

## 2016-09-16 LAB — MICROALBUMIN / CREATININE URINE RATIO
Creatinine, Urine: 57.8 mg/dL
Microalbumin, Urine: <3 ug/mL

## 2016-09-16 LAB — HEMOGLOBIN A1C
Est. average glucose Bld gHb Est-mCnc: 169 mg/dL
HEMOGLOBIN A1C: 7.5 % — AB (ref 4.8–5.6)

## 2016-09-17 DIAGNOSIS — M791 Myalgia: Secondary | ICD-10-CM | POA: Diagnosis not present

## 2016-09-17 DIAGNOSIS — M6281 Muscle weakness (generalized): Secondary | ICD-10-CM | POA: Diagnosis not present

## 2016-09-17 DIAGNOSIS — R262 Difficulty in walking, not elsewhere classified: Secondary | ICD-10-CM | POA: Diagnosis not present

## 2016-09-17 DIAGNOSIS — M25562 Pain in left knee: Secondary | ICD-10-CM | POA: Diagnosis not present

## 2016-09-19 DIAGNOSIS — R262 Difficulty in walking, not elsewhere classified: Secondary | ICD-10-CM | POA: Diagnosis not present

## 2016-09-19 DIAGNOSIS — M25562 Pain in left knee: Secondary | ICD-10-CM | POA: Diagnosis not present

## 2016-09-19 DIAGNOSIS — M791 Myalgia: Secondary | ICD-10-CM | POA: Diagnosis not present

## 2016-09-19 DIAGNOSIS — M6281 Muscle weakness (generalized): Secondary | ICD-10-CM | POA: Diagnosis not present

## 2016-09-20 ENCOUNTER — Encounter: Payer: Self-pay | Admitting: Family Medicine

## 2016-09-20 ENCOUNTER — Ambulatory Visit (INDEPENDENT_AMBULATORY_CARE_PROVIDER_SITE_OTHER): Payer: Commercial Managed Care - HMO | Admitting: Family Medicine

## 2016-09-20 VITALS — BP 118/76 | Ht 58.75 in | Wt 133.8 lb

## 2016-09-20 DIAGNOSIS — K219 Gastro-esophageal reflux disease without esophagitis: Secondary | ICD-10-CM

## 2016-09-20 DIAGNOSIS — E039 Hypothyroidism, unspecified: Secondary | ICD-10-CM | POA: Diagnosis not present

## 2016-09-20 DIAGNOSIS — R0789 Other chest pain: Secondary | ICD-10-CM

## 2016-09-20 DIAGNOSIS — E119 Type 2 diabetes mellitus without complications: Secondary | ICD-10-CM | POA: Diagnosis not present

## 2016-09-20 DIAGNOSIS — K760 Fatty (change of) liver, not elsewhere classified: Secondary | ICD-10-CM

## 2016-09-20 DIAGNOSIS — Z23 Encounter for immunization: Secondary | ICD-10-CM | POA: Diagnosis not present

## 2016-09-20 MED ORDER — HYDROCODONE-ACETAMINOPHEN 7.5-325 MG PO TABS: ORAL_TABLET | ORAL | 0 refills | Status: DC

## 2016-09-20 MED ORDER — ALPRAZOLAM 0.25 MG PO TABS: 0.2500 mg | ORAL_TABLET | Freq: Two times a day (BID) | ORAL | 5 refills | Status: DC | PRN

## 2016-09-20 NOTE — Progress Notes (Signed)
   Subjective:    Patient ID: Nicole Lam, female    DOB: [birth year], 65 y.o.   MRN: [id number]  Diabetes  She presents for her follow-up diabetic visit. She has type 2 diabetes mellitus. Pertinent negatives for hypoglycemia include no confusion. Pertinent negatives for diabetes include no chest pain, no fatigue, no polydipsia, no polyphagia and no weakness. She is compliant with treatment all of the time. She is following a generally unhealthy diet. She never (cannot exercise do to knee pain. goes to therapy twice a week for knee) participates in exercise. Home blood sugar record trend: does not check blood sugar. She does not see a podiatrist.Eye exam is current.   A1C on bloodwork 7.5.   Having pain all over from shoulders down. Needs refill on hydrocodone. Patient using pain medication for her knee under the care of Dr. Chrys Racer her thyroid medicine regular basis energy level fair  Lisinopril on a regular basis for blood pressure tries to watch diet does not do a good job of it because of her work schedule  Diabetes subpar control because of poor diet he states she is compliant with the medicine     Review of Systems  Constitutional: Negative for activity change, appetite change and fatigue.  HENT: Negative for congestion.   Respiratory: Negative for cough.   Cardiovascular: Negative for chest pain.  Gastrointestinal: Negative for abdominal pain.  Endocrine: Negative for polydipsia and polyphagia.  Neurological: Negative for weakness.  Psychiatric/Behavioral: Negative for confusion.       Objective:   Physical Exam  Constitutional: She appears well-nourished. No distress.  Cardiovascular: Normal rate, regular rhythm and normal heart sounds.   No murmur heard. Pulmonary/Chest: Effort normal and breath sounds normal. No respiratory distress.  Musculoskeletal: She exhibits no edema.  Lymphadenopathy:    She has no cervical adenopathy.  Neurological: She is alert.  She exhibits normal muscle tone.  Psychiatric: Her behavior is normal.  Vitals reviewed.  Diabetic foot exam normal Lab work reviewed with patient in detail LDL mildly elevated but she is on is much pravastatin as her liver can take she does have fatty liver ultrasound was done in January lab work does show elevated liver enzymes repeat these again in 6 months watch diet stay active try to lose weight Blood pressure good control continue current measures Xanax when stressed sparingly Pain medication as necessary for the left knee only when at home cautioned drowsiness Continue thyroid medicine lab work reviewed with patient Reflux under good control continue current measures Patient did have some intermittent chest discomforts 2 months ago her EKG looks good now she denies any chest tightness pressure pain with walking I told her if this returns she will need referral to cardiology      Assessment & Plan:  See assessment right above

## 2016-09-20 NOTE — Patient Instructions (Signed)
Diabetes Mellitus and Food It is important for you to manage your blood sugar (glucose) level. Your blood glucose level can be greatly affected by what you eat. Eating healthier foods in the appropriate amounts throughout the day at about the same time each day will help you control your blood glucose level. It can also help slow or prevent worsening of your diabetes mellitus. Healthy eating may even help you improve the level of your blood pressure and reach or maintain a healthy weight. General recommendations for healthful eating and cooking habits include:  Eating meals and snacks regularly. Avoid going long periods of time without eating to lose weight.  Eating a diet that consists mainly of plant-based foods, such as fruits, vegetables, nuts, legumes, and whole grains.  Using low-heat cooking methods, such as baking, instead of high-heat cooking methods, such as deep frying.  Work with your dietitian to make sure you understand how to use the Nutrition Facts information on food labels. How can food affect me? Carbohydrates Carbohydrates affect your blood glucose level more than any other type of food. Your dietitian will help you determine how many carbohydrates to eat at each meal and teach you how to count carbohydrates. Counting carbohydrates is important to keep your blood glucose at a healthy level, especially if you are using insulin or taking certain medicines for diabetes mellitus. Alcohol Alcohol can cause sudden decreases in blood glucose (hypoglycemia), especially if you use insulin or take certain medicines for diabetes mellitus. Hypoglycemia can be a life-threatening condition. Symptoms of hypoglycemia (sleepiness, dizziness, and disorientation) are similar to symptoms of having too much alcohol. If your health care provider has given you approval to drink alcohol, do so in moderation and use the following guidelines:  Women should not have more than one drink per day, and men  should not have more than two drinks per day. One drink is equal to: ? 12 oz of beer. ? 5 oz of wine. ? 1 oz of hard liquor.  Do not drink on an empty stomach.  Keep yourself hydrated. Have water, diet soda, or unsweetened iced tea.  Regular soda, juice, and other mixers might contain a lot of carbohydrates and should be counted.  What foods are not recommended? As you make food choices, it is important to remember that all foods are not the same. Some foods have fewer nutrients per serving than other foods, even though they might have the same number of calories or carbohydrates. It is difficult to get your body what it needs when you eat foods with fewer nutrients. Examples of foods that you should avoid that are high in calories and carbohydrates but low in nutrients include:  Trans fats (most processed foods list trans fats on the Nutrition Facts label).  Regular soda.  Juice.  Candy.  Sweets, such as cake, pie, doughnuts, and cookies.  Fried foods.  What foods can I eat? Eat nutrient-rich foods, which will nourish your body and keep you healthy. The food you should eat also will depend on several factors, including:  The calories you need.  The medicines you take.  Your weight.  Your blood glucose level.  Your blood pressure level.  Your cholesterol level.  You should eat a variety of foods, including:  Protein. ? Lean cuts of meat. ? Proteins low in saturated fats, such as fish, egg whites, and beans. Avoid processed meats.  Fruits and vegetables. ? Fruits and vegetables that may help control blood glucose levels, such as apples,   mangoes, and yams.  Dairy products. ? Choose fat-free or low-fat dairy products, such as milk, yogurt, and cheese.  Grains, bread, pasta, and rice. ? Choose whole grain products, such as multigrain bread, whole oats, and brown rice. These foods may help control blood pressure.  Fats. ? Foods containing healthful fats, such as  nuts, avocado, olive oil, canola oil, and fish.  Does everyone with diabetes mellitus have the same meal plan? Because every person with diabetes mellitus is different, there is not one meal plan that works for everyone. It is very important that you meet with a dietitian who will help you create a meal plan that is just right for you. This information is not intended to replace advice given to you by your health care provider. Make sure you discuss any questions you have with your health care provider. Document Released: 06/28/2005 Document Revised: 03/08/2016 Document Reviewed: 08/28/2013 Elsevier Interactive Patient Education  2017 [employer] Inc[employer]  

## 2016-09-21 ENCOUNTER — Encounter: Payer: Self-pay | Admitting: Family Medicine

## 2016-09-22 ENCOUNTER — Other Ambulatory Visit: Payer: Self-pay | Admitting: Family Medicine

## 2016-09-24 DIAGNOSIS — M25562 Pain in left knee: Secondary | ICD-10-CM | POA: Diagnosis not present

## 2016-09-24 DIAGNOSIS — M791 Myalgia: Secondary | ICD-10-CM | POA: Diagnosis not present

## 2016-09-24 DIAGNOSIS — R262 Difficulty in walking, not elsewhere classified: Secondary | ICD-10-CM | POA: Diagnosis not present

## 2016-09-24 DIAGNOSIS — M6281 Muscle weakness (generalized): Secondary | ICD-10-CM | POA: Diagnosis not present

## 2016-09-26 DIAGNOSIS — M25562 Pain in left knee: Secondary | ICD-10-CM | POA: Diagnosis not present

## 2016-09-26 DIAGNOSIS — R262 Difficulty in walking, not elsewhere classified: Secondary | ICD-10-CM | POA: Diagnosis not present

## 2016-09-26 DIAGNOSIS — M6281 Muscle weakness (generalized): Secondary | ICD-10-CM | POA: Diagnosis not present

## 2016-09-26 DIAGNOSIS — M791 Myalgia: Secondary | ICD-10-CM | POA: Diagnosis not present

## 2016-10-01 DIAGNOSIS — M791 Myalgia: Secondary | ICD-10-CM | POA: Diagnosis not present

## 2016-10-01 DIAGNOSIS — R262 Difficulty in walking, not elsewhere classified: Secondary | ICD-10-CM | POA: Diagnosis not present

## 2016-10-01 DIAGNOSIS — M25562 Pain in left knee: Secondary | ICD-10-CM | POA: Diagnosis not present

## 2016-10-01 DIAGNOSIS — M6281 Muscle weakness (generalized): Secondary | ICD-10-CM | POA: Diagnosis not present

## 2016-10-03 DIAGNOSIS — M6281 Muscle weakness (generalized): Secondary | ICD-10-CM | POA: Diagnosis not present

## 2016-10-03 DIAGNOSIS — M25562 Pain in left knee: Secondary | ICD-10-CM | POA: Diagnosis not present

## 2016-10-03 DIAGNOSIS — M791 Myalgia: Secondary | ICD-10-CM | POA: Diagnosis not present

## 2016-10-03 DIAGNOSIS — R262 Difficulty in walking, not elsewhere classified: Secondary | ICD-10-CM | POA: Diagnosis not present

## 2016-10-06 ENCOUNTER — Other Ambulatory Visit: Payer: Self-pay | Admitting: Family Medicine

## 2016-10-10 DIAGNOSIS — M6281 Muscle weakness (generalized): Secondary | ICD-10-CM | POA: Diagnosis not present

## 2016-10-10 DIAGNOSIS — R262 Difficulty in walking, not elsewhere classified: Secondary | ICD-10-CM | POA: Diagnosis not present

## 2016-10-10 DIAGNOSIS — M791 Myalgia: Secondary | ICD-10-CM | POA: Diagnosis not present

## 2016-10-10 DIAGNOSIS — M25562 Pain in left knee: Secondary | ICD-10-CM | POA: Diagnosis not present

## 2016-10-11 ENCOUNTER — Other Ambulatory Visit: Payer: Self-pay | Admitting: Family Medicine

## 2016-10-11 DIAGNOSIS — M25562 Pain in left knee: Secondary | ICD-10-CM | POA: Diagnosis not present

## 2016-10-11 DIAGNOSIS — M6281 Muscle weakness (generalized): Secondary | ICD-10-CM | POA: Diagnosis not present

## 2016-10-11 DIAGNOSIS — M791 Myalgia: Secondary | ICD-10-CM | POA: Diagnosis not present

## 2016-10-11 DIAGNOSIS — R262 Difficulty in walking, not elsewhere classified: Secondary | ICD-10-CM | POA: Diagnosis not present

## 2016-10-16 ENCOUNTER — Ambulatory Visit: Payer: Commercial Managed Care - HMO | Admitting: Orthopedic Surgery

## 2016-10-17 DIAGNOSIS — M6281 Muscle weakness (generalized): Secondary | ICD-10-CM | POA: Diagnosis not present

## 2016-10-17 DIAGNOSIS — R262 Difficulty in walking, not elsewhere classified: Secondary | ICD-10-CM | POA: Diagnosis not present

## 2016-10-17 DIAGNOSIS — M791 Myalgia: Secondary | ICD-10-CM | POA: Diagnosis not present

## 2016-10-17 DIAGNOSIS — M25562 Pain in left knee: Secondary | ICD-10-CM | POA: Diagnosis not present

## 2016-10-22 DIAGNOSIS — M25562 Pain in left knee: Secondary | ICD-10-CM | POA: Diagnosis not present

## 2016-10-22 DIAGNOSIS — R262 Difficulty in walking, not elsewhere classified: Secondary | ICD-10-CM | POA: Diagnosis not present

## 2016-10-22 DIAGNOSIS — M791 Myalgia: Secondary | ICD-10-CM | POA: Diagnosis not present

## 2016-10-22 DIAGNOSIS — M6281 Muscle weakness (generalized): Secondary | ICD-10-CM | POA: Diagnosis not present

## 2016-10-23 ENCOUNTER — Encounter: Payer: Self-pay | Admitting: Orthopedic Surgery

## 2016-10-23 ENCOUNTER — Ambulatory Visit (INDEPENDENT_AMBULATORY_CARE_PROVIDER_SITE_OTHER): Payer: Commercial Managed Care - HMO | Admitting: Orthopedic Surgery

## 2016-10-23 DIAGNOSIS — M25562 Pain in left knee: Secondary | ICD-10-CM

## 2016-10-23 MED ORDER — DICLOFENAC SODIUM 1 % TD GEL: 4.0000 g | Freq: Four times a day (QID) | TRANSDERMAL | 5 refills | Status: DC

## 2016-10-23 NOTE — Progress Notes (Signed)
Patient ID: SHAVON ASHMORE, female   DOB: 1951-10-03, 66 y.o.   MRN: [id number]  Chief Complaint  Patient presents with  . Follow-up    left knee pain    HPI LIRIDONA MASHAW is a 66 y.o. female.   HPI  66 year-old female history loss or arthritis left knee squatting at work several weeks back felt acute pain on the medial side of her left knee and somewhat across the front of the knee. She initially did well with physical therapy but then had increased pain   She was treated with Aleve twice a day. She denies mechanical symptoms  As noted in the note on November 28 review of systems reveal no constitutional symptoms no shortness of breath or chest pain  She is in physical therapy  Knee has improved, she says she has no significant pain just A TWINGE  occasionally when she's working   Review of Systems Review of Systems  See the above note   Physical Exam  Left Knee Exam   Range of Motion  The patient has normal left knee ROM.  Muscle Strength   The patient has normal left knee strength.  Other  Erythema: absent Swelling: none  Comments:  Full range of motion, no swelling normal gait normal mood oriented 3 normal appearance       MEDICAL DECISION MAKING  DATA     DIAGNOSIS  Encounter Diagnosis  Name Primary?  . Acute pain of left knee Yes     PLAN(RISK)    Return as needed

## 2016-10-24 DIAGNOSIS — M791 Myalgia: Secondary | ICD-10-CM | POA: Diagnosis not present

## 2016-10-24 DIAGNOSIS — M6281 Muscle weakness (generalized): Secondary | ICD-10-CM | POA: Diagnosis not present

## 2016-10-24 DIAGNOSIS — R262 Difficulty in walking, not elsewhere classified: Secondary | ICD-10-CM | POA: Diagnosis not present

## 2016-10-24 DIAGNOSIS — M25562 Pain in left knee: Secondary | ICD-10-CM | POA: Diagnosis not present

## 2016-11-05 ENCOUNTER — Encounter: Payer: Self-pay | Admitting: Internal Medicine

## 2016-11-20 ENCOUNTER — Encounter: Payer: Self-pay | Admitting: Internal Medicine

## 2016-11-20 ENCOUNTER — Ambulatory Visit (INDEPENDENT_AMBULATORY_CARE_PROVIDER_SITE_OTHER): Payer: Medicare HMO | Admitting: Internal Medicine

## 2016-11-20 VITALS — BP 122/67 | HR 94 | Temp 97.6°F | Ht <= 58 in | Wt 132.4 lb

## 2016-11-20 DIAGNOSIS — K7581 Nonalcoholic steatohepatitis (NASH): Secondary | ICD-10-CM

## 2016-11-20 NOTE — Patient Instructions (Addendum)
Maximize control of sugars  Get as much exercise as feasible  Consider regular coffee intake  Repeat lfts with OV in 6 months

## 2016-11-20 NOTE — Progress Notes (Signed)
Primary Care Physician:  Sallee Lange, MD Primary Gastroenterologist:  Dr. Gala Romney  Pre-Procedure History & Physical: HPI:  Nicole Lam is a 66 y.o. female here for elevated LFTs fatty liver on ultrasound. Metavir F2/F3. No evidence of advanced liver disease as far as no portal hypertension, etc. elsewhere. Mild nonspecific elevation in transaminases. History of type 2 diabetes mellitus. Hemoglobin A1c in the 7-1/2 range.  Continues on statin therapy as well as Krill oil for lipid dyscrasia. Gets very little exercise.  GERD continues well controlled on Protonix 40 mg daily  Loss 4 pounds since her last visit.  Past Medical History:  Diagnosis Date  . Diabetes ([place])   . Fatty liver   . Hyperlipidemia   . Hypothyroidism   . Lipoma of neck     Past Surgical History:  Procedure Laterality Date  . ABDOMINAL HYSTERECTOMY    . APPENDECTOMY    . COLONOSCOPY  05/30/05   GMW:NUUVOZ anal papilla, otherwise normal rectum.and colon  . COLONOSCOPY N/A 11/04/2015   RMR: normal colonoscopy  . DILATION AND CURETTAGE OF UTERUS      Prior to Admission medications   Medication Sig Start Date End Date Taking? Authorizing Provider  acyclovir (ZOVIRAX) 400 MG tablet TAKE 1 TABLET BY MOUTH 5 TIMES DAILY 07/14/16  Yes [name], MD  acyclovir (ZOVIRAX) 400 MG tablet Take 1 tablet (400 mg total) by mouth 5 (five) times daily. 07/05/16  Yes Florian Buff, MD  ALPRAZolam Duanne Moron) 0.25 MG tablet Take 1 tablet (0.25 mg total) by mouth 2 (two) times daily as needed for anxiety or sleep. 09/20/16  Yes Kathyrn Drown, MD  aspirin 81 MG tablet Take 81 mg by mouth at bedtime.    Yes Historical Provider, MD  beta carotene w/minerals (OCUVITE) tablet Take 1 tablet by mouth daily.   Yes Historical Provider, MD  diclofenac sodium (VOLTAREN) 1 % GEL Apply 4 g topically 4 (four) times daily. 10/23/16  Yes Carole Civil, MD  docusate sodium (COLACE) 100 MG capsule Take 100 mg by mouth daily as needed for mild  constipation or moderate constipation.    Yes Historical Provider, MD  estradiol (ESTRACE) 0.5 MG tablet TAKE 1 TABLET EVERY DAY. 07/13/16  Yes [name], MD  estradiol (ESTRACE) 0.5 MG tablet Take 1 tablet (0.5 mg total) by mouth daily. 07/16/16  Yes [name], MD  HYDROcodone-acetaminophen [facility]) 7.5-325 MG tablet 1/2 to 1  bid prn 09/20/16  Yes Kathyrn Drown, MD  KRILL OIL PO Take 1 capsule by mouth daily.   Yes Historical Provider, MD  levothyroxine (SYNTHROID, LEVOTHROID) 75 MCG tablet TAKE 1 TABLET EVERY DAY BEFORE BREAKFAST (NEED MD APPOINTMENT) 09/24/16  Yes Kathyrn Drown, MD  lisinopril (PRINIVIL,ZESTRIL) 5 MG tablet TAKE 1 TABLET EVERY DAY (NEED MD APPOINTMENT) 09/24/16  Yes Kathyrn Drown, MD  meloxicam (MOBIC) 15 MG tablet TAKE 1 TABLET EVERY DAY 07/10/16  Yes Kathyrn Drown, MD  metFORMIN (GLUCOPHAGE) 500 MG tablet TAKE 2 TABLETS IN THE MORNING  AND TAKE 2 TABLETS IN THE EVENING WITH MEALS 02/21/16  Yes Kathyrn Drown, MD  naproxen sodium (ANAPROX) 220 MG tablet Take 440 mg by mouth once as needed (for pain).   Yes Historical Provider, MD  pantoprazole (PROTONIX) 40 MG tablet TAKE 1 TABLET EVERY DAY 10/11/16  Yes Kathyrn Drown, MD  pravastatin (PRAVACHOL) 20 MG tablet TAKE 1 TABLET (20 MG TOTAL) BY MOUTH DAILY. 10/09/16  Yes Scott  A Luking, MD  pyridoxine (B-6) 100 MG tablet Take 200 mg by mouth daily.    Yes Historical Provider, MD    Allergies as of 11/20/2016  . (No Known Allergies)    Family History  Problem Relation Age of Onset  . Heart disease Mother   . Hypertension Mother   . Birth defects Daughter   . Colon cancer Neg Hx   . Liver disease Neg Hx     Social History   Social History  . Marital status: Married    Spouse name: N/A  . Number of children: N/A  . Years of education: N/A   Occupational History  . Not on file.   Social History Main Topics  . Smoking status: Never Smoker  . Smokeless tobacco: Never Used  . Alcohol use No     Comment:  Once every 3-4 months will have a drink  . Drug use: No  . Sexual activity: Yes    Birth control/ protection: Surgical   Other Topics Concern  . Not on file   Social History Narrative  . No narrative on file    Review of Systems: See HPI, otherwise negative ROS  Physical Exam: BP 122/67   Pulse 94   Temp 97.6 F (36.4 C) (Oral)   Ht 4' 10"  (1.473 m)   Wt 132 lb 6.4 oz (60.1 kg)   BMI 27.67 kg/m  General:   Alert,  pleasant and cooperative in NAD Mouth:  No deformity or lesions. Neck:  Supple; no masses or thyromegaly. No significant cervical adenopathy. Lungs:  Clear throughout to auscultation.   No wheezes, crackles, or rhonchi. No acute distress. Heart:  Regular rate and rhythm; no murmurs, clicks, rubs,  or gallops. Abdomen: Non-distended, normal bowel sounds.  Soft and nontender without appreciable mass or hepatosplenomegaly.  Pulses:  Normal pulses noted. Extremities:  Without clubbing or edema.  Impression:  66 year old with metabolic syndrome/[name] likely. Some degree of fibrosis likely. May have early cirrhosis. Very well compensated at this time.  Transaminases nonspecifically elevated. Likely due to steatohepatitis given her prior workup. She needs to be on statin therapy. It is okay to continue statin therapy as long as transaminases don't float up above 2-3 times the upper limit of normal. Significantly elevated transaminases and/ or jaundice would be indicators to withhold statin therapy-however, this scenario is quite rare.  The multipronged approach to dealing with Nicole Lam reviewed with the patient.  GERD well controlled.  Recommendations:  Maximize control of sugars  Get as much exercise as feasible (becoming as physically fit as possible probably more important than actually losing weight improving  Both parameters is ideal)  Consider regular coffee intake  Repeat lfts with OV in 6 months  We'll also consider repeat liver imaging in 6 months as  well.  Continue Protonix 40 mg daily.    Notice: This dictation was prepared with Dragon dictation along with smaller phrase technology. Any transcriptional errors that result from this process are unintentional and may not be corrected upon review.

## 2017-01-10 ENCOUNTER — Other Ambulatory Visit: Payer: Self-pay | Admitting: Internal Medicine

## 2017-01-10 DIAGNOSIS — K760 Fatty (change of) liver, not elsewhere classified: Secondary | ICD-10-CM

## 2017-01-10 DIAGNOSIS — R945 Abnormal results of liver function studies: Secondary | ICD-10-CM

## 2017-01-10 DIAGNOSIS — R7989 Other specified abnormal findings of blood chemistry: Secondary | ICD-10-CM

## 2017-02-01 ENCOUNTER — Other Ambulatory Visit: Payer: Self-pay | Admitting: Family Medicine

## 2017-02-02 ENCOUNTER — Other Ambulatory Visit: Payer: Self-pay | Admitting: Family Medicine

## 2017-02-02 ENCOUNTER — Other Ambulatory Visit: Payer: Self-pay | Admitting: Obstetrics and Gynecology

## 2017-02-02 NOTE — Telephone Encounter (Signed)
refil estradiol x 6 mos.

## 2017-02-07 ENCOUNTER — Telehealth: Payer: Self-pay | Admitting: Family Medicine

## 2017-02-07 DIAGNOSIS — E785 Hyperlipidemia, unspecified: Secondary | ICD-10-CM

## 2017-02-07 DIAGNOSIS — I10 Essential (primary) hypertension: Secondary | ICD-10-CM

## 2017-02-07 DIAGNOSIS — E119 Type 2 diabetes mellitus without complications: Secondary | ICD-10-CM

## 2017-02-07 DIAGNOSIS — Z79899 Other long term (current) drug therapy: Secondary | ICD-10-CM

## 2017-02-07 DIAGNOSIS — E039 Hypothyroidism, unspecified: Secondary | ICD-10-CM

## 2017-02-07 NOTE — Telephone Encounter (Signed)
Patient has an appointment on 03/25/17 with Dr. Nicki Reaper.  Is blood work needed?

## 2017-02-07 NOTE — Telephone Encounter (Signed)
TSH, lipid, liver, hemoglobin M3T, metabolic 7

## 2017-02-07 NOTE — Telephone Encounter (Signed)
Last labs 09/15/17 - lipid, liver, bmp, a1c, microalb urine

## 2017-02-08 NOTE — Telephone Encounter (Signed)
Orders put in. Left message notifying pt.

## 2017-02-20 DIAGNOSIS — E785 Hyperlipidemia, unspecified: Secondary | ICD-10-CM | POA: Diagnosis not present

## 2017-02-20 DIAGNOSIS — I10 Essential (primary) hypertension: Secondary | ICD-10-CM | POA: Diagnosis not present

## 2017-02-20 DIAGNOSIS — Z79899 Other long term (current) drug therapy: Secondary | ICD-10-CM | POA: Diagnosis not present

## 2017-02-20 DIAGNOSIS — E119 Type 2 diabetes mellitus without complications: Secondary | ICD-10-CM | POA: Diagnosis not present

## 2017-02-20 DIAGNOSIS — E039 Hypothyroidism, unspecified: Secondary | ICD-10-CM | POA: Diagnosis not present

## 2017-02-21 LAB — LIPID PANEL
CHOL/HDL RATIO: 4.6 ratio — AB (ref 0.0–4.4)
Cholesterol, Total: 190 mg/dL (ref 100–199)
HDL: 41 mg/dL (ref >39)
LDL CALC: 122 mg/dL — AB (ref 0–99)
Triglycerides: 136 mg/dL (ref 0–149)
VLDL CHOLESTEROL CAL: 27 mg/dL (ref 5–40)

## 2017-02-21 LAB — BASIC METABOLIC PANEL
BUN/Creatinine Ratio: 18 (ref 12–28)
BUN: 14 mg/dL (ref 8–27)
CHLORIDE: 99 mmol/L (ref 96–106)
CO2: 27 mmol/L (ref 18–29)
Calcium: 9.5 mg/dL (ref 8.7–10.3)
Creatinine, Ser: 0.8 mg/dL (ref 0.57–1.00)
GFR calc Af Amer: 88 mL/min/{1.73_m2} (ref >59)
GFR calc non Af Amer: 77 mL/min/{1.73_m2} (ref >59)
GLUCOSE: 125 mg/dL — AB (ref 65–99)
Potassium: 4.4 mmol/L (ref 3.5–5.2)
Sodium: 141 mmol/L (ref 134–144)

## 2017-02-21 LAB — HEPATIC FUNCTION PANEL
ALBUMIN: 4.7 g/dL (ref 3.6–4.8)
ALT: 54 IU/L — ABNORMAL HIGH (ref 0–32)
AST: 57 IU/L — ABNORMAL HIGH (ref 0–40)
Alkaline Phosphatase: 71 IU/L (ref 39–117)
Bilirubin Total: 0.5 mg/dL (ref 0.0–1.2)
Bilirubin, Direct: 0.14 mg/dL (ref 0.00–0.40)
TOTAL PROTEIN: 7.2 g/dL (ref 6.0–8.5)

## 2017-02-21 LAB — HEMOGLOBIN A1C
ESTIMATED AVERAGE GLUCOSE: 166 mg/dL
Hgb A1c MFr Bld: 7.4 % — ABNORMAL HIGH (ref 4.8–5.6)

## 2017-02-21 LAB — TSH: TSH: 0.362 u[IU]/mL — AB (ref 0.450–4.500)

## 2017-02-25 ENCOUNTER — Other Ambulatory Visit: Payer: Self-pay | Admitting: *Deleted

## 2017-02-25 DIAGNOSIS — E039 Hypothyroidism, unspecified: Secondary | ICD-10-CM

## 2017-02-25 DIAGNOSIS — R945 Abnormal results of liver function studies: Secondary | ICD-10-CM

## 2017-02-25 DIAGNOSIS — R7989 Other specified abnormal findings of blood chemistry: Secondary | ICD-10-CM

## 2017-02-25 DIAGNOSIS — Z79899 Other long term (current) drug therapy: Secondary | ICD-10-CM

## 2017-02-25 DIAGNOSIS — E785 Hyperlipidemia, unspecified: Secondary | ICD-10-CM

## 2017-02-25 DIAGNOSIS — E119 Type 2 diabetes mellitus without complications: Secondary | ICD-10-CM

## 2017-02-25 DIAGNOSIS — K760 Fatty (change of) liver, not elsewhere classified: Secondary | ICD-10-CM

## 2017-02-25 MED ORDER — PRAVASTATIN SODIUM 40 MG PO TABS: 40.0000 mg | ORAL_TABLET | Freq: Every day | ORAL | 0 refills | Status: DC

## 2017-02-25 MED ORDER — LEVOTHYROXINE SODIUM 75 MCG PO TABS: ORAL_TABLET | ORAL | 1 refills | Status: DC

## 2017-03-21 ENCOUNTER — Ambulatory Visit: Payer: Commercial Managed Care - HMO | Admitting: Family Medicine

## 2017-03-25 ENCOUNTER — Ambulatory Visit (INDEPENDENT_AMBULATORY_CARE_PROVIDER_SITE_OTHER): Payer: Medicare HMO | Admitting: Family Medicine

## 2017-03-25 ENCOUNTER — Encounter: Payer: Self-pay | Admitting: Family Medicine

## 2017-03-25 VITALS — BP 134/76 | Ht <= 58 in | Wt 130.4 lb

## 2017-03-25 DIAGNOSIS — E7849 Other hyperlipidemia: Secondary | ICD-10-CM

## 2017-03-25 DIAGNOSIS — E784 Other hyperlipidemia: Secondary | ICD-10-CM

## 2017-03-25 DIAGNOSIS — E039 Hypothyroidism, unspecified: Secondary | ICD-10-CM | POA: Diagnosis not present

## 2017-03-25 DIAGNOSIS — L821 Other seborrheic keratosis: Secondary | ICD-10-CM | POA: Diagnosis not present

## 2017-03-25 DIAGNOSIS — Z780 Asymptomatic menopausal state: Secondary | ICD-10-CM | POA: Diagnosis not present

## 2017-03-25 DIAGNOSIS — K760 Fatty (change of) liver, not elsewhere classified: Secondary | ICD-10-CM

## 2017-03-25 DIAGNOSIS — E119 Type 2 diabetes mellitus without complications: Secondary | ICD-10-CM | POA: Diagnosis not present

## 2017-03-25 DIAGNOSIS — K219 Gastro-esophageal reflux disease without esophagitis: Secondary | ICD-10-CM

## 2017-03-25 MED ORDER — LEVOTHYROXINE SODIUM 75 MCG PO TABS: ORAL_TABLET | ORAL | 1 refills | Status: DC

## 2017-03-25 MED ORDER — PANTOPRAZOLE SODIUM 40 MG PO TBEC: 40.0000 mg | DELAYED_RELEASE_TABLET | Freq: Every day | ORAL | 1 refills | Status: DC

## 2017-03-25 MED ORDER — PRAVASTATIN SODIUM 40 MG PO TABS: 40.0000 mg | ORAL_TABLET | Freq: Every day | ORAL | 1 refills | Status: DC

## 2017-03-25 MED ORDER — METFORMIN HCL 500 MG PO TABS: ORAL_TABLET | ORAL | 5 refills | Status: DC

## 2017-03-25 NOTE — Progress Notes (Signed)
   Subjective:    Patient ID: Nicole Lam, female    DOB: 1950/12/18, 66 y.o.   MRN: [id number]  Diabetes  She presents for her follow-up diabetic visit. She has type 2 diabetes mellitus. Pertinent negatives for hypoglycemia include no confusion. Pertinent negatives for diabetes include no chest pain, no fatigue, no polydipsia, no polyphagia and no weakness. She does not see a podiatrist.Eye exam is current.    Patient has concerns of bilateral foot pain-She relates intermittent pain when she walks.    and has concerns of dry cough. Review of Systems  Constitutional: Negative for activity change, appetite change and fatigue.  HENT: Negative for congestion.   Respiratory: Negative for cough.   Cardiovascular: Negative for chest pain.  Gastrointestinal: Negative for abdominal pain.  Endocrine: Negative for polydipsia and polyphagia.  Neurological: Negative for weakness.  Psychiatric/Behavioral: Negative for confusion.       Objective:   Physical Exam  Constitutional: She appears well-nourished. No distress.  Cardiovascular: Normal rate, regular rhythm and normal heart sounds.   No murmur heard. Pulmonary/Chest: Effort normal and breath sounds normal. No respiratory distress.  Musculoskeletal: She exhibits no edema.  Lymphadenopathy:    She has no cervical adenopathy.  Neurological: She is alert. She exhibits normal muscle tone.  Psychiatric: Her behavior is normal.  Vitals reviewed.         Assessment & Plan:  #1 fatty liver-patient does have moderately elevated liver enzymes. Previous lab work do not indicate another cause. The patient has been seen by gastroenterology regarding this. The patient understands importance of doing a good job with dietary control and trying to lose weight  Diabetes patient consumes too many starches we did discuss better dietary measures continue current measures if A1c does not come down this year goes on we will need to add additional  medicine although the patient states she's having a difficult time affording the medications  Hypothyroidism previous labs look good we will repeat lab work later this year follow-up office visit then continue current medication  Reflux under good control with current medication  Hyperlipidemia previous labs slight subpar control the dose was increased we'll repeat lipid liver profile in the fall with follow-up office visit  Seborrheic keratosis benign skin lesions patient educated on risk factors and what skin cancers including melanoma look like including pictures were shown to the patient she if she has any trouble with any of these in the future she needs to let us know immediately   density ordered bone  Patient relates dry cough-the office visit today was spent discussing all the other factors above. This area was not discussed. We will have the nurses call to get more information.

## 2017-03-25 NOTE — Patient Instructions (Signed)
Diabetes Mellitus and Food It is important for you to manage your blood sugar (glucose) level. Your blood glucose level can be greatly affected by what you eat. Eating healthier foods in the appropriate amounts throughout the day at about the same time each day will help you control your blood glucose level. It can also help slow or prevent worsening of your diabetes mellitus. Healthy eating may even help you improve the level of your blood pressure and reach or maintain a healthy weight. General recommendations for healthful eating and cooking habits include:  Eating meals and snacks regularly. Avoid going long periods of time without eating to lose weight.  Eating a diet that consists mainly of plant-based foods, such as fruits, vegetables, nuts, legumes, and whole grains.  Using low-heat cooking methods, such as baking, instead of high-heat cooking methods, such as deep frying.  Work with your dietitian to make sure you understand how to use the Nutrition Facts information on food labels. How can food affect me? Carbohydrates Carbohydrates affect your blood glucose level more than any other type of food. Your dietitian will help you determine how many carbohydrates to eat at each meal and teach you how to count carbohydrates. Counting carbohydrates is important to keep your blood glucose at a healthy level, especially if you are using insulin or taking certain medicines for diabetes mellitus. Alcohol Alcohol can cause sudden decreases in blood glucose (hypoglycemia), especially if you use insulin or take certain medicines for diabetes mellitus. Hypoglycemia can be a life-threatening condition. Symptoms of hypoglycemia (sleepiness, dizziness, and disorientation) are similar to symptoms of having too much alcohol. If your health care provider has given you approval to drink alcohol, do so in moderation and use the following guidelines:  Women should not have more than one drink per day, and men  should not have more than two drinks per day. One drink is equal to: ? 12 oz of beer. ? 5 oz of wine. ? 1 oz of hard liquor.  Do not drink on an empty stomach.  Keep yourself hydrated. Have water, diet soda, or unsweetened iced tea.  Regular soda, juice, and other mixers might contain a lot of carbohydrates and should be counted.  What foods are not recommended? As you make food choices, it is important to remember that all foods are not the same. Some foods have fewer nutrients per serving than other foods, even though they might have the same number of calories or carbohydrates. It is difficult to get your body what it needs when you eat foods with fewer nutrients. Examples of foods that you should avoid that are high in calories and carbohydrates but low in nutrients include:  Trans fats (most processed foods list trans fats on the Nutrition Facts label).  Regular soda.  Juice.  Candy.  Sweets, such as cake, pie, doughnuts, and cookies.  Fried foods.  What foods can I eat? Eat nutrient-rich foods, which will nourish your body and keep you healthy. The food you should eat also will depend on several factors, including:  The calories you need.  The medicines you take.  Your weight.  Your blood glucose level.  Your blood pressure level.  Your cholesterol level.  You should eat a variety of foods, including:  Protein. ? Lean cuts of meat. ? Proteins low in saturated fats, such as fish, egg whites, and beans. Avoid processed meats.  Fruits and vegetables. ? Fruits and vegetables that may help control blood glucose levels, such as apples,   mangoes, and yams.  Dairy products. ? Choose fat-free or low-fat dairy products, such as milk, yogurt, and cheese.  Grains, bread, pasta, and rice. ? Choose whole grain products, such as multigrain bread, whole oats, and brown rice. These foods may help control blood pressure.  Fats. ? Foods containing healthful fats, such as  nuts, avocado, olive oil, canola oil, and fish.  Does everyone with diabetes mellitus have the same meal plan? Because every person with diabetes mellitus is different, there is not one meal plan that works for everyone. It is very important that you meet with a dietitian who will help you create a meal plan that is just right for you. This information is not intended to replace advice given to you by your health care provider. Make sure you discuss any questions you have with your health care provider. Document Released: 06/28/2005 Document Revised: 03/08/2016 Document Reviewed: 08/28/2013 Elsevier Interactive Patient Education  2017 [employer] Inc[employer]  

## 2017-03-29 ENCOUNTER — Other Ambulatory Visit: Payer: Self-pay | Admitting: Obstetrics and Gynecology

## 2017-03-29 DIAGNOSIS — Z1231 Encounter for screening mammogram for malignant neoplasm of breast: Secondary | ICD-10-CM

## 2017-04-01 ENCOUNTER — Telehealth: Payer: Self-pay | Admitting: *Deleted

## 2017-04-01 NOTE — Telephone Encounter (Signed)
It is possible that her dry cough is coming from lisinopril. This is a side effect that occurs in approximately 5% of individuals taking lisinopril. I would recommend stopping lisinopril. Also recommend losartan 25 mg 1 daily to take the place of lisinopril. May have 30 day prescription with 5 refills. If this is the reason for her dry cough this should away over the course of the next 3-4 weeks if her dry cough is not gone away over the next 3-4 weeks she should follow-up. Otherwise keep all regular scheduled follow-up visits.

## 2017-04-01 NOTE — Telephone Encounter (Signed)
Spoke with patient and patient stated that she has dry cough occasionally. Please advise?

## 2017-04-01 NOTE — Telephone Encounter (Signed)
Nicole Drown, MD  [facility]        After the patient was cared for I saw that she also had concerns regarding a dry cough. This was not addressed during her office visit. Please call the patient find out if the dry cough is frequently every day or just occasionally. It is possible it could be related to her lisinopril. We may need to change blood pressure medicines. Thank you   Left message to return call.

## 2017-04-01 NOTE — Telephone Encounter (Signed)
Left message return call 04/01/17

## 2017-04-03 ENCOUNTER — Other Ambulatory Visit: Payer: Self-pay | Admitting: *Deleted

## 2017-04-03 MED ORDER — LOSARTAN POTASSIUM 25 MG PO TABS: 25.0000 mg | ORAL_TABLET | Freq: Every day | ORAL | 0 refills | Status: DC

## 2017-04-03 MED ORDER — LOSARTAN POTASSIUM 25 MG PO TABS: 25.0000 mg | ORAL_TABLET | Freq: Every day | ORAL | 1 refills | Status: DC

## 2017-04-03 NOTE — Telephone Encounter (Signed)
Discussed with pt. Pt ok with the med changed. She wants 30 day supply to [place] in [place] and 90 day to [place]. rx sent to [facility] with a note to cancel lisinopril. Pt verbalized understanding.

## 2017-04-04 ENCOUNTER — Ambulatory Visit (HOSPITAL_COMMUNITY)
Admission: RE | Admit: 2017-04-04 | Discharge: 2017-04-04 | Disposition: A | Discharge status: Home / Self Care | Payer: Medicare HMO | Source: Ambulatory Visit | Attending: Family Medicine | Admitting: Family Medicine

## 2017-04-04 ENCOUNTER — Telehealth: Payer: Self-pay | Admitting: Family Medicine

## 2017-04-04 DIAGNOSIS — Z780 Asymptomatic menopausal state: Secondary | ICD-10-CM | POA: Insufficient documentation

## 2017-04-04 DIAGNOSIS — E2839 Other primary ovarian failure: Secondary | ICD-10-CM | POA: Diagnosis not present

## 2017-04-04 NOTE — Telephone Encounter (Signed)
Patient dropped off a piece of mail for Dr. Nicki Reaper to complete regarding her comprehensive foot exam.  See in yellow basket.

## 2017-04-07 NOTE — Telephone Encounter (Signed)
Completed.

## 2017-04-11 ENCOUNTER — Ambulatory Visit (HOSPITAL_COMMUNITY)
Admission: RE | Admit: 2017-04-11 | Discharge: 2017-04-11 | Disposition: A | Discharge status: Home / Self Care | Payer: Medicare HMO | Source: Ambulatory Visit | Attending: Obstetrics and Gynecology | Admitting: Obstetrics and Gynecology

## 2017-04-11 DIAGNOSIS — Z1231 Encounter for screening mammogram for malignant neoplasm of breast: Secondary | ICD-10-CM | POA: Insufficient documentation

## 2017-04-14 ENCOUNTER — Encounter: Payer: Self-pay | Admitting: Family Medicine

## 2017-04-18 ENCOUNTER — Encounter: Payer: Self-pay | Admitting: Internal Medicine

## 2017-04-30 ENCOUNTER — Other Ambulatory Visit: Payer: Self-pay | Admitting: Family Medicine

## 2017-05-22 DIAGNOSIS — Z79899 Other long term (current) drug therapy: Secondary | ICD-10-CM | POA: Diagnosis not present

## 2017-05-22 DIAGNOSIS — E039 Hypothyroidism, unspecified: Secondary | ICD-10-CM | POA: Diagnosis not present

## 2017-05-22 DIAGNOSIS — E119 Type 2 diabetes mellitus without complications: Secondary | ICD-10-CM | POA: Diagnosis not present

## 2017-05-22 DIAGNOSIS — E785 Hyperlipidemia, unspecified: Secondary | ICD-10-CM | POA: Diagnosis not present

## 2017-05-23 LAB — TSH: TSH: 5 u[IU]/mL — AB (ref 0.450–4.500)

## 2017-05-23 LAB — HEMOGLOBIN A1C
Est. average glucose Bld gHb Est-mCnc: 174 mg/dL
Hgb A1c MFr Bld: 7.7 % — ABNORMAL HIGH (ref 4.8–5.6)

## 2017-05-23 LAB — LIPID PANEL
CHOL/HDL RATIO: 4.7 ratio — AB (ref 0.0–4.4)
Cholesterol, Total: 182 mg/dL (ref 100–199)
HDL: 39 mg/dL — ABNORMAL LOW (ref >39)
LDL CALC: 103 mg/dL — AB (ref 0–99)
Triglycerides: 202 mg/dL — ABNORMAL HIGH (ref 0–149)
VLDL Cholesterol Cal: 40 mg/dL (ref 5–40)

## 2017-05-23 LAB — HEPATIC FUNCTION PANEL
ALK PHOS: 78 IU/L (ref 39–117)
ALT: 47 IU/L — AB (ref 0–32)
AST: 57 IU/L — ABNORMAL HIGH (ref 0–40)
Albumin: 4.6 g/dL (ref 3.6–4.8)
Bilirubin Total: 0.5 mg/dL (ref 0.0–1.2)
Bilirubin, Direct: 0.13 mg/dL (ref 0.00–0.40)
TOTAL PROTEIN: 7.3 g/dL (ref 6.0–8.5)

## 2017-05-27 ENCOUNTER — Other Ambulatory Visit: Payer: Self-pay

## 2017-05-27 MED ORDER — SITAGLIPTIN PHOSPHATE 50 MG PO TABS: 50.0000 mg | ORAL_TABLET | Freq: Every day | ORAL | 5 refills | Status: DC

## 2017-05-27 MED ORDER — LEVOTHYROXINE SODIUM 88 MCG PO TABS: 88.0000 ug | ORAL_TABLET | Freq: Every day | ORAL | 5 refills | Status: DC

## 2017-05-31 IMAGING — US US ABDOMEN COMPLETE W/ ELASTOGRAPHY
1 series · 13 of 25 positions shown · non-contrast
Comparison: None.

CLINICAL DATA: Elevated LFTs



[Series 1: us abdomen complete w/ elastography · 0.22mm/px · 13 of 82 slices shown]
[im 1/82]
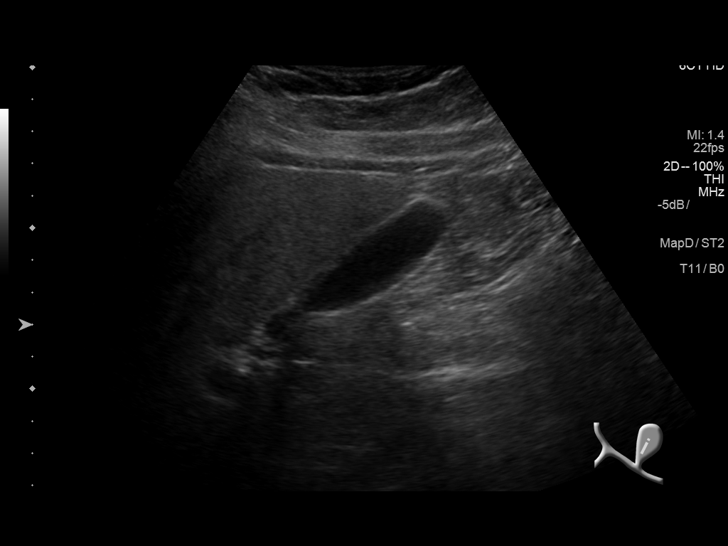
[im 7/82]
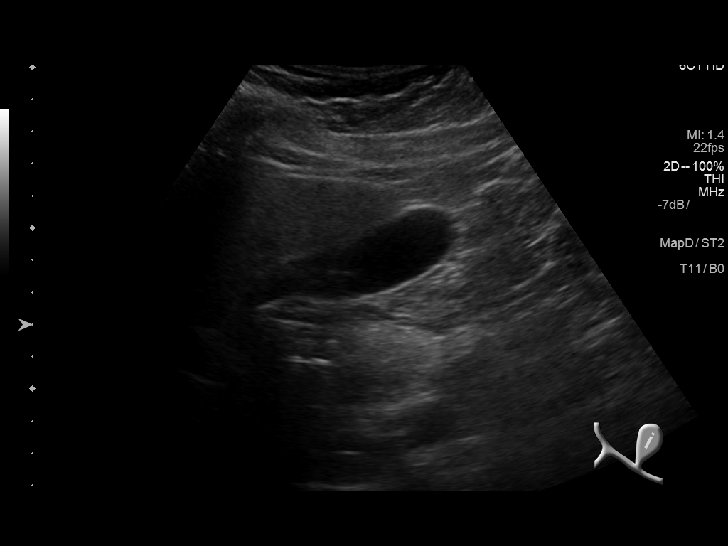
[im 14/82]
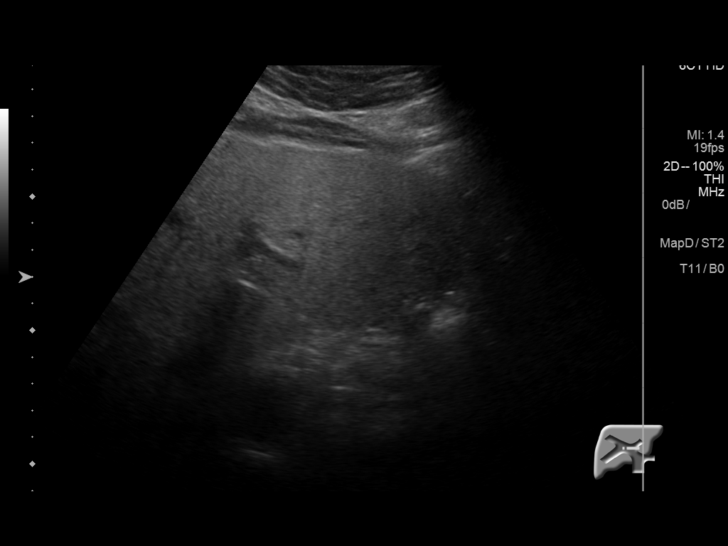
[im 21/82]
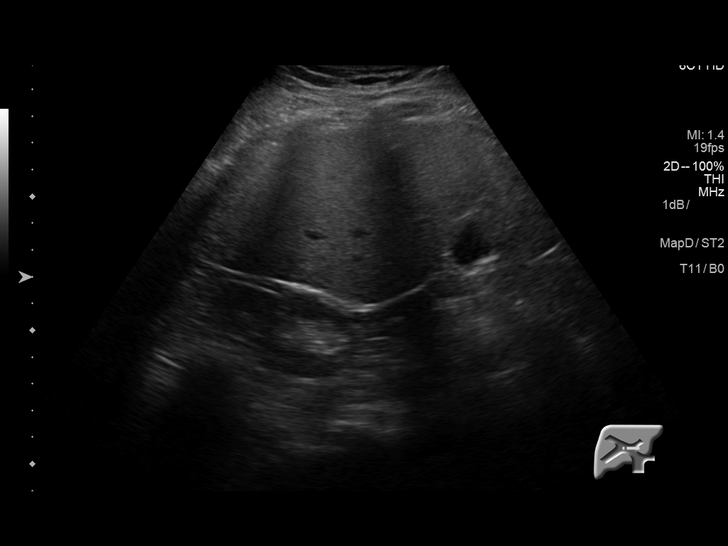
[im 28/82]
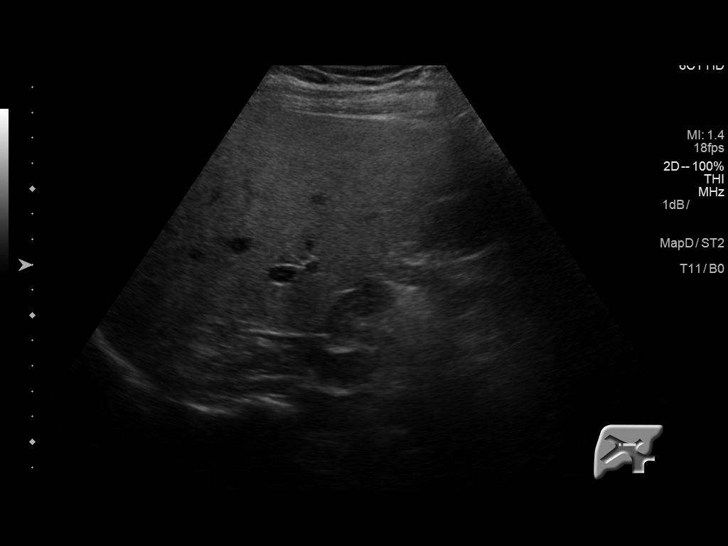
[im 34/82]
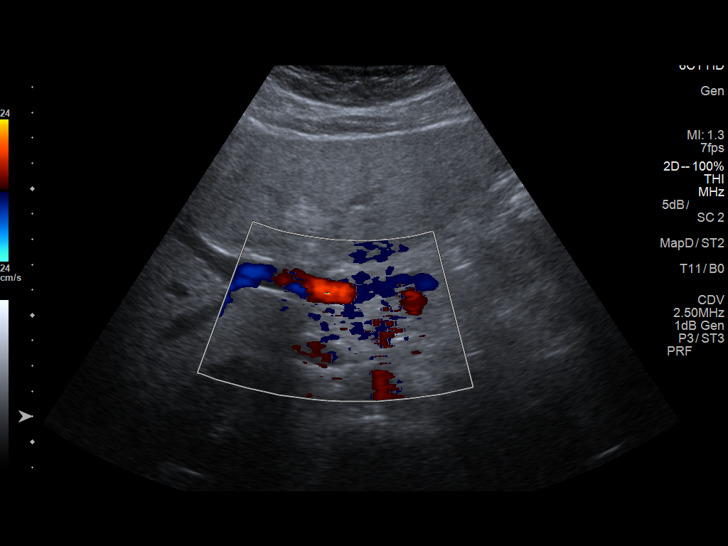
[im 41/82]
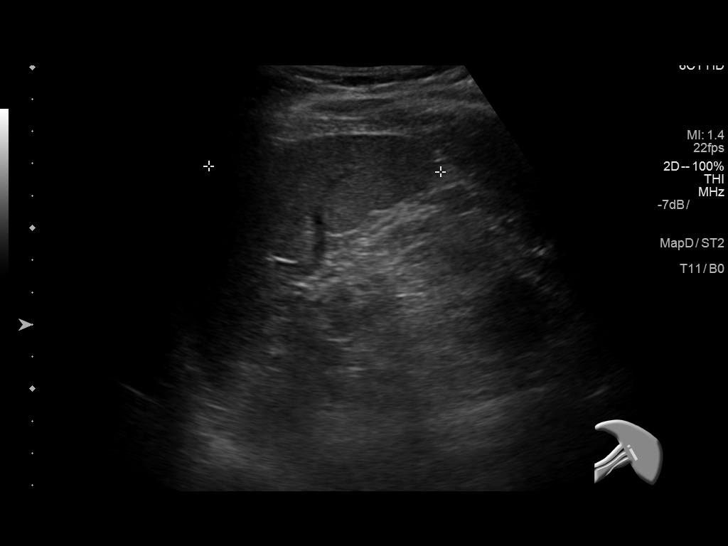
[im 48/82]
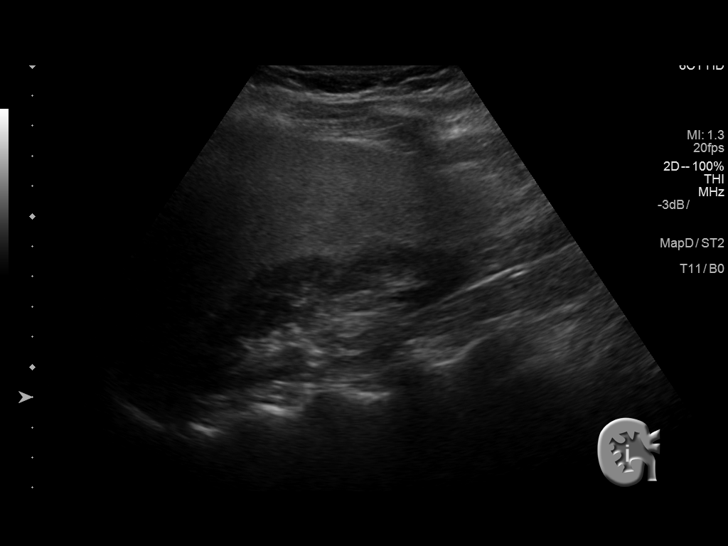
[im 55/82]
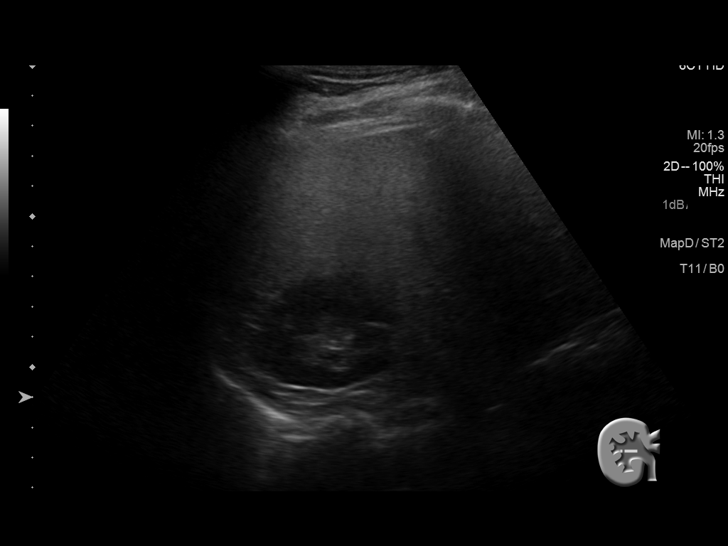
[im 61/82]
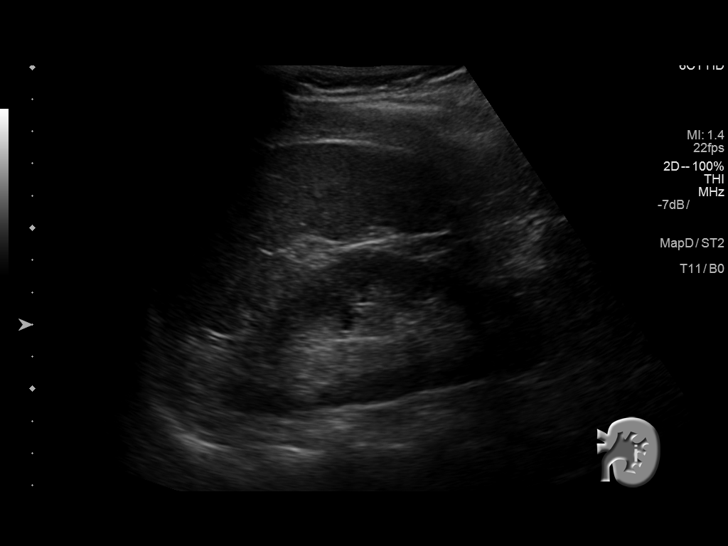
[im 68/82]
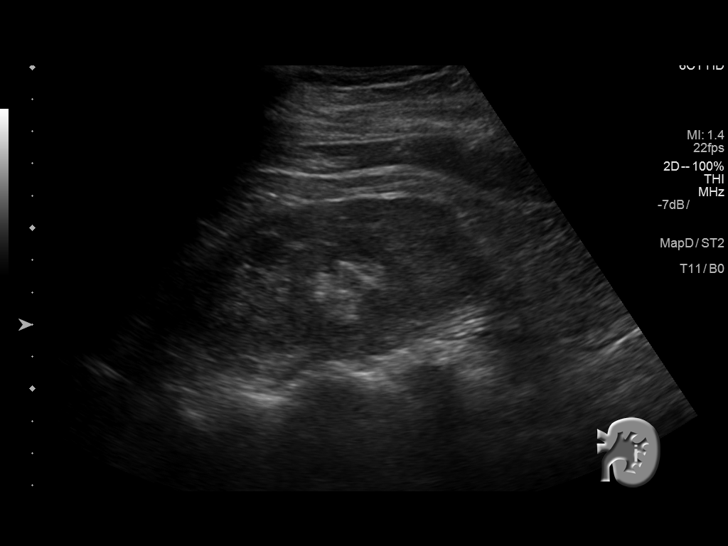
[im 75/82]
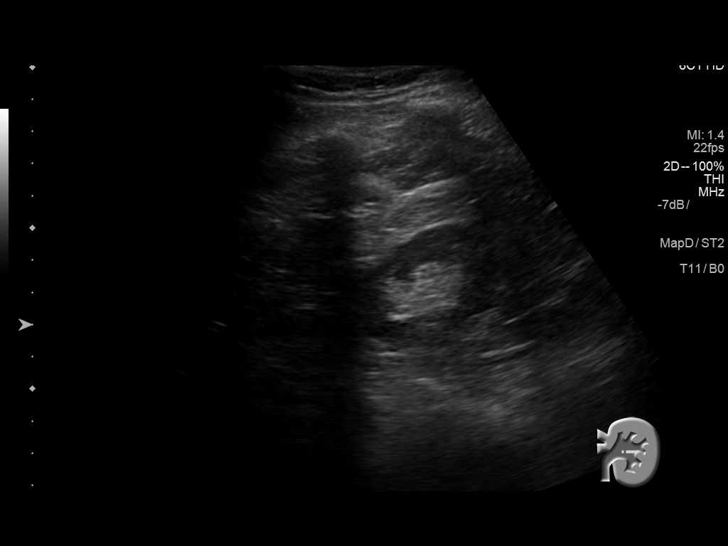
[im 82/82]
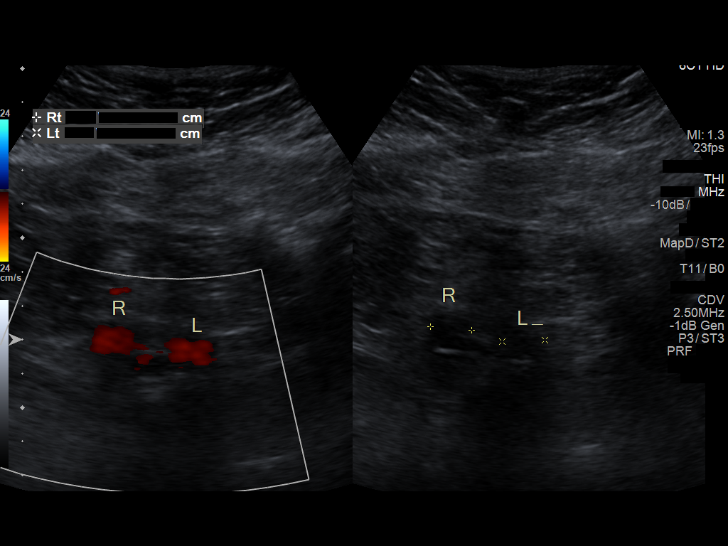

[13 of 25 positions shown; findings below may reference images not displayed]

FINDINGS: ULTRASOUND ABDOMEN

Gallbladder: No gallstones, gallbladder wall thickening, or
pericholecystic fluid. Negative sonographic Murphy's sign.

Common bile duct: Diameter: 3 mm

Liver: Hyperechoic hepatic parenchyma with mildly nodular left
hepatic contour. No focal hepatic lesion is seen.

IVC: No abnormality visualized.

Pancreas: Visualized portion unremarkable.

Spleen: Size and appearance within normal limits.

Right Kidney: Length: 10.7 cm. No mass or hydronephrosis.

Left Kidney: Length: 10.4 cm. No mass or hydronephrosis.

Abdominal aorta: No aneurysm visualized.

Other findings: None.

ULTRASOUND HEPATIC ELASTOGRAPHY

Device: Siemens Helix VTQ

Patient position: Oblique

Transducer 6C1

Number of measurements:  10

Hepatic Segment:  8

Median velocity:   2.00  m/sec

IQR:

IQR/Median velocity ratio

Corresponding Metavir fibrosis score:  F2/F3

Risk of fibrosis: Moderate

Limitations of exam: None

Pertinent findings noted on other imaging exams:  None

Please note that abnormal shear wave velocities may also be
identified in clinical settings other than with hepatic fibrosis,
such as: acute hepatitis, elevated right heart and central venous
pressures including use of beta blockers, Saravanan disease
([place]), infiltrative processes such as
mastocytosis/amyloidosis/infiltrative tumor, extrahepatic
cholestasis, in the post-prandial state, and liver transplantation.
Correlation with patient history, laboratory data, and clinical
condition recommended.
IMPRESSION: Hyperechoic hepatic parenchyma with mildly nodular hepatic contour,
suggesting cirrhosis and/or hepatic steatosis.

No focal hepatic lesion is seen.

Median hepatic shear wave velocity is calculated at 2.00 m/sec.

Corresponding Metavir fibrosis score is F2/F3.

Risk of fibrosis is moderate.

Follow-up:  Additional testing appropriate.

## 2017-06-27 ENCOUNTER — Ambulatory Visit (INDEPENDENT_AMBULATORY_CARE_PROVIDER_SITE_OTHER): Payer: Medicare HMO | Admitting: Gastroenterology

## 2017-06-27 ENCOUNTER — Encounter: Payer: Self-pay | Admitting: Gastroenterology

## 2017-06-27 VITALS — BP 131/73 | HR 78 | Temp 97.5°F | Ht 59.0 in | Wt 131.8 lb

## 2017-06-27 DIAGNOSIS — K760 Fatty (change of) liver, not elsewhere classified: Secondary | ICD-10-CM | POA: Diagnosis not present

## 2017-06-27 NOTE — Progress Notes (Signed)
Referring Provider: Kathyrn Drown, MD Primary Care Physician:  Kathyrn Drown, MD Primary GI: Dr. Gala Romney   Chief Complaint  Patient presents with  . [name]    f/u, doing ok    HPI:   Nicole Lam is a 66 y.o. female presenting today with a history of mildly elevated transaminases, fatty liver on ultrasound with Metavir score F2/F3. Likely dealing with [name]. Needs ultrasound of abdomen. Most recent LFTs still with very mild elevation of transaminases, better than previously. Serologies in 2017 with negative Hep C, negative Hep B surface antigen, normal ceruloplasmin, AMA/ASMA negative, celiac serologies negative.   Drinks one cup of coffee every morning. If she drinks over one cup, she gets jittery. No abdominal pain. No loss of appetite. No jaundice, pruritis, confusion. No constipation exacerbations or diarrhea. Takes Miralax for constipation. Works well for her.     Past Medical History:  Diagnosis Date  . Diabetes ([place])   . Fatty liver   . Hyperlipidemia   . Hypothyroidism   . Lipoma of neck     Past Surgical History:  Procedure Laterality Date  . ABDOMINAL HYSTERECTOMY    . APPENDECTOMY    . COLONOSCOPY  05/30/05   JOA:CZYSAY anal papilla, otherwise normal rectum.and colon  . COLONOSCOPY N/A 11/04/2015   RMR: normal colonoscopy  . DILATION AND CURETTAGE OF UTERUS      Current Outpatient Prescriptions  Medication Sig Dispense Refill  . acyclovir (ZOVIRAX) 400 MG tablet Take 1 tablet (400 mg total) by mouth 5 (five) times daily. (Patient taking differently: Take 400 mg by mouth as needed. ) 25 tablet 6  . ALPRAZolam (XANAX) 0.25 MG tablet Take 1 tablet (0.25 mg total) by mouth 2 (two) times daily as needed for anxiety or sleep. 30 tablet 5  . aspirin 81 MG tablet Take 81 mg by mouth at bedtime.     . diclofenac sodium (VOLTAREN) 1 % GEL Apply 4 g topically 4 (four) times daily. (Patient taking differently: Apply 4 g topically as needed. ) 5 Tube 5  . estradiol  (ESTRACE) 0.5 MG tablet TAKE 1 TABLET EVERY DAY 90 tablet 1  . Glucosamine-Chondroitin (GLUCOSAMINE CHONDR COMPLEX PO) Take by mouth. Chews 3 by mouth daily ( pt unsure of dose)    . HYDROcodone-acetaminophen (NORCO) 7.5-325 MG tablet 1/2 to 1  bid prn 50 tablet 0  . levothyroxine (SYNTHROID, LEVOTHROID) 88 MCG tablet Take 1 tablet (88 mcg total) by mouth daily. 30 tablet 5  . losartan (COZAAR) 25 MG tablet Take 1 tablet (25 mg total) by mouth daily. 90 tablet 1  . losartan (COZAAR) 25 MG tablet TAKE 1 TABLET BY MOUTH ONCE DAILY 90 tablet 1  . meloxicam (MOBIC) 15 MG tablet TAKE 1 TABLET EVERY DAY 90 tablet 5  . metFORMIN (GLUCOPHAGE) 500 MG tablet TAKE 2 TABLETS IN THE MORNING  AND TAKE 2 TABLETS IN THE EVENING WITH MEALS 360 tablet 5  . naproxen sodium (ANAPROX) 220 MG tablet Take 440 mg by mouth once as needed (for pain).    Marland Kitchen OVER THE COUNTER MEDICATION Joint Juice by mouth daily    . pantoprazole (PROTONIX) 40 MG tablet Take 1 tablet (40 mg total) by mouth daily. 90 tablet 1  . polyethylene glycol (MIRALAX / GLYCOLAX) packet Take 17 g by mouth daily as needed.    . pravastatin (PRAVACHOL) 40 MG tablet Take 1 tablet (40 mg total) by mouth daily. 90 tablet 1  . sitaGLIPtin (JANUVIA) 50  MG tablet Take 1 tablet (50 mg total) by mouth daily. 30 tablet 5   No current facility-administered medications for this visit.     Allergies as of 06/27/2017  . (No Known Allergies)    Family History  Problem Relation Age of Onset  . Heart disease Mother   . Hypertension Mother   . Birth defects Daughter   . Colon cancer Neg Hx   . Liver disease Neg Hx     Social History   Social History  . Marital status: Married    Spouse name: N/A  . Number of children: N/A  . Years of education: N/A   Social History Main Topics  . Smoking status: Never Smoker  . Smokeless tobacco: Never Used  . Alcohol use No     Comment: Once every 3-4 months will have a drink  . Drug use: No  . Sexual activity:  Yes    Birth control/ protection: Surgical   Other Topics Concern  . None   Social History Narrative  . None    Review of Systems: Gen: Denies fever, chills, anorexia. Denies fatigue, weakness, weight loss.  CV: Denies chest pain, palpitations, syncope, peripheral edema, and claudication. Resp: Denies dyspnea at rest, cough, wheezing, coughing up blood, and pleurisy. GI: see HPI  Derm: Denies rash, itching, dry skin Psych: Denies depression, anxiety, memory loss, confusion. No homicidal or suicidal ideation.  Heme: Denies bruising, bleeding, and enlarged lymph nodes.  Physical Exam: BP 131/73   Pulse 78   Temp (!) 97.5 F (36.4 C) (Oral)   Ht 4' 11"  (1.499 m)   Wt 131 lb 12.8 oz (59.8 kg)   BMI 26.62 kg/m  General:   Alert and oriented. No distress noted. Pleasant and cooperative.  Head:  Normocephalic and atraumatic. Eyes:  Conjuctiva clear without scleral icterus. Mouth:  Oral mucosa pink and moist. Abdomen:  +BS, soft, non-tender and non-distended. No rebound or guarding. No HSM or masses noted. Msk:  Symmetrical without gross deformities. Normal posture. Extremities:  Without edema. Neurologic:  Alert and  oriented x4 Psych:  Alert and cooperative. Normal mood and affect.  Lab Results  Component Value Date   ALT 47 (H) 05/22/2017   AST 57 (H) 05/22/2017   ALKPHOS 78 05/22/2017   BILITOT 0.5 05/22/2017    Lab Results  Component Value Date   CREATININE 0.80 02/20/2017   BUN 14 02/20/2017   NA 141 02/20/2017   K 4.4 02/20/2017   CL 99 02/20/2017   CO2 27 02/20/2017

## 2017-06-27 NOTE — Progress Notes (Signed)
CC'ED TO PCP 

## 2017-06-27 NOTE — Assessment & Plan Note (Signed)
66 year old female with likely [name]. Mild elevation of transaminases noted. Needs updated US abdomen. Continue coffee daily. If any evidence of advanced liver disease on ultrasound abdomen, will order CBC and likely need EGD to evaluate for varices. Otherwise, return in 6 months.

## 2017-06-27 NOTE — Patient Instructions (Signed)
We have ordered an ultrasound of your liver.   We will see you in 6 months!

## 2017-07-04 ENCOUNTER — Ambulatory Visit (HOSPITAL_COMMUNITY)
Admission: RE | Admit: 2017-07-04 | Discharge: 2017-07-04 | Disposition: A | Discharge status: Home / Self Care | Payer: Medicare HMO | Source: Ambulatory Visit | Attending: Gastroenterology | Admitting: Gastroenterology

## 2017-07-04 DIAGNOSIS — K760 Fatty (change of) liver, not elsewhere classified: Secondary | ICD-10-CM | POA: Insufficient documentation

## 2017-07-04 DIAGNOSIS — K7689 Other specified diseases of liver: Secondary | ICD-10-CM | POA: Diagnosis not present

## 2017-07-09 NOTE — Progress Notes (Signed)
Fatty liver on ultrasound. No concerning signs. Let's go ahead and get a CBC, as she has not had one in quite some time.

## 2017-07-10 ENCOUNTER — Ambulatory Visit (INDEPENDENT_AMBULATORY_CARE_PROVIDER_SITE_OTHER): Payer: Medicare HMO | Admitting: Nurse Practitioner

## 2017-07-10 ENCOUNTER — Other Ambulatory Visit: Payer: Self-pay | Admitting: Gastroenterology

## 2017-07-10 ENCOUNTER — Encounter: Payer: Self-pay | Admitting: Nurse Practitioner

## 2017-07-10 ENCOUNTER — Other Ambulatory Visit: Payer: Self-pay

## 2017-07-10 ENCOUNTER — Other Ambulatory Visit: Payer: Self-pay | Admitting: Family Medicine

## 2017-07-10 ENCOUNTER — Telehealth: Payer: Self-pay | Admitting: Family Medicine

## 2017-07-10 VITALS — BP 136/70 | Ht <= 58 in | Wt 131.4 lb

## 2017-07-10 DIAGNOSIS — K760 Fatty (change of) liver, not elsewhere classified: Secondary | ICD-10-CM

## 2017-07-10 DIAGNOSIS — N3943 Post-void dribbling: Secondary | ICD-10-CM

## 2017-07-10 DIAGNOSIS — K644 Residual hemorrhoidal skin tags: Secondary | ICD-10-CM

## 2017-07-10 LAB — POCT URINALYSIS DIPSTICK
LEUKOCYTES UA: NEGATIVE
PH UA: 5 (ref 5.0–8.0)
RBC UA: NEGATIVE
Spec Grav, UA: <=1.005 — AB (ref 1.010–1.025)

## 2017-07-10 MED ORDER — HYDROCORTISONE 2.5 % RE CREA: 1.0000 "application " | TOPICAL_CREAM | Freq: Two times a day (BID) | RECTAL | 0 refills | Status: DC

## 2017-07-10 NOTE — Telephone Encounter (Signed)
Pt has a 5 month follow up scheduled in November with Dr. Nicki Reaper, wants to know if she needs to have lab work done.   Please advise & call pt

## 2017-07-10 NOTE — Telephone Encounter (Signed)
Last labs 05/28/17 lipid, liver, tsh a1c

## 2017-07-10 NOTE — Patient Instructions (Signed)
Hemorrhoids Hemorrhoids are swollen veins in and around the rectum or anus. There are two types of hemorrhoids:  Internal hemorrhoids. These occur in the veins that are just inside the rectum. They may poke through to the outside and become irritated and painful.  External hemorrhoids. These occur in the veins that are outside of the anus and can be felt as a painful swelling or hard lump near the anus.  Most hemorrhoids do not cause serious problems, and they can be managed with home treatments such as diet and lifestyle changes. If home treatments do not help your symptoms, procedures can be done to shrink or remove the hemorrhoids. What are the causes? This condition is caused by increased pressure in the anal area. This pressure may result from various things, including:  Constipation.  Straining to have a bowel movement.  Diarrhea.  Pregnancy.  Obesity.  Sitting for long periods of time.  Heavy lifting or other activity that causes you to strain.  Anal sex.  What are the signs or symptoms? Symptoms of this condition include:  Pain.  Anal itching or irritation.  Rectal bleeding.  Leakage of stool (feces).  Anal swelling.  One or more lumps around the anus.  How is this diagnosed? This condition can often be diagnosed through a visual exam. Other exams or tests may also be done, such as:  Examination of the rectal area with a gloved hand (digital rectal exam).  Examination of the anal canal using a small tube (anoscope).  A blood test, if you have lost a significant amount of blood.  A test to look inside the colon (sigmoidoscopy or colonoscopy).  How is this treated? This condition can usually be treated at home. However, various procedures may be done if dietary changes, lifestyle changes, and other home treatments do not help your symptoms. These procedures can help make the hemorrhoids smaller or remove them completely. Some of these procedures involve  surgery, and others do not. Common procedures include:  Rubber band ligation. Rubber bands are placed at the base of the hemorrhoids to cut off the blood supply to them.  Sclerotherapy. Medicine is injected into the hemorrhoids to shrink them.  Infrared coagulation. A type of light energy is used to get rid of the hemorrhoids.  Hemorrhoidectomy surgery. The hemorrhoids are surgically removed, and the veins that supply them are tied off.  Stapled hemorrhoidopexy surgery. A circular stapling device is used to remove the hemorrhoids and use staples to cut off the blood supply to them.  Follow these instructions at home: Eating and drinking  Eat foods that have a lot of fiber in them, such as whole grains, beans, nuts, fruits, and vegetables. Ask your health care provider about taking products that have added fiber (fiber supplements).  Drink enough fluid to keep your urine clear or pale yellow. Managing pain and swelling  Take warm sitz baths for 20 minutes, 3-4 times a day to ease pain and discomfort.  If directed, apply ice to the affected area. Using ice packs between sitz baths may be helpful. ? Put ice in a plastic bag. ? Place a towel between your skin and the bag. ? Leave the ice on for 20 minutes, 2-3 times a day. General instructions  Take over-the-counter and prescription medicines only as told by your health care provider.  Use medicated creams or suppositories as told.  Exercise regularly.  Go to the bathroom when you have the urge to have a bowel movement. Do not wait.  Avoid straining to have bowel movements.  Keep the anal area dry and clean. Use wet toilet paper or moist towelettes after a bowel movement.  Do not sit on the toilet for long periods of time. This increases blood pooling and pain. Contact a health care provider if:  You have increasing pain and swelling that are not controlled by treatment or medicine.  You have uncontrolled bleeding.  You  have difficulty having a bowel movement, or you are unable to have a bowel movement.  You have pain or inflammation outside the area of the hemorrhoids. This information is not intended to replace advice given to you by your health care provider. Make sure you discuss any questions you have with your health care provider. Document Released: 09/28/2000 Document Revised: 02/29/2016 Document Reviewed: 06/15/2015 Elsevier Interactive Patient Education  2017 [employer].

## 2017-07-11 NOTE — Telephone Encounter (Signed)
C she actually has lab work from 8/14 which was put in to the system that is what we would like for her to do before her November visit

## 2017-07-11 NOTE — Telephone Encounter (Signed)
I spoke with the pt, she is aware.

## 2017-07-11 NOTE — Telephone Encounter (Signed)
May have this +3 refills 

## 2017-07-11 NOTE — Telephone Encounter (Signed)
I called and left a vm asked that she r/c.

## 2017-07-12 ENCOUNTER — Encounter: Payer: Self-pay | Admitting: Nurse Practitioner

## 2017-07-12 NOTE — Progress Notes (Signed)
Subjective:  Presents for possible hemorrhoid first noticed yesterday. Some burning in the rectal area, mainly with wiping. No bleeding. No fever. No abdominal pain. Has a pattern of taking Barbaraann Faster ask for 4 days then stopping. Does strain with her BMs at times. No hard BMs. Uncomfortable with sitting. Slight relief with Preparation H. Also has been having some urinary leakage and dribbling. No dysuria. Some frequency. States it feels like it is leaking all the time. Began about 2 months ago. Has an appointment with Dr. Glo Herring early next week for evaluation. No fever. No back or pelvic pain.  Objective:   BP 136/70   Ht 4' 9.5" (1.461 m)   Wt 131 lb 6 oz (59.6 kg)   BMI 27.94 kg/m  NAD. Alert, oriented. Lungs clear. Heart regular rate rhythm. 2 small pink nonthrombosed external hemorrhoids noted tender to palpation. Digital rectal exam reveals no distinct internal hemorrhoid. Results for orders placed or performed in visit on 07/10/17  POCT urinalysis dipstick  Result Value Ref Range   Color, UA Yellow    Clarity, UA     Glucose, UA     Bilirubin, UA     Ketones, UA     Spec Grav, UA <=1.005 (A) 1.010 - 1.025   Blood, UA Negative    pH, UA 5.0 5.0 - 8.0   Protein, UA     Urobilinogen, UA  0.2 or 1.0 E.U./dL   Nitrite, UA     Leukocytes, UA Negative Negative     Assessment:  External hemorrhoid  Post-void dribbling - Plan: POCT urinalysis dipstick    Plan:   Meds ordered this encounter  Medications  . hydrocortisone (ANUSOL-HC) 2.5 % rectal cream    Sig: Place 1 application rectally 2 (two) times daily.    Dispense:  30 g    Refill:  0    Order Specific Question:   Supervising Provider    Answer:   Mikey Kirschner [[id number]]   Warm sitz baths. Continue Preparation H, and cortisone cream. Avoid hard stools and straining as much as possible. Call back next week if no improvement, sooner if worse. Follow-up with Dr. Glo Herring regarding urinary leakage. Call back sooner if any  new symptoms.

## 2017-07-15 ENCOUNTER — Encounter: Payer: Self-pay | Admitting: Obstetrics and Gynecology

## 2017-07-15 ENCOUNTER — Ambulatory Visit (INDEPENDENT_AMBULATORY_CARE_PROVIDER_SITE_OTHER): Payer: Medicare HMO | Admitting: Obstetrics and Gynecology

## 2017-07-15 DIAGNOSIS — K644 Residual hemorrhoidal skin tags: Secondary | ICD-10-CM | POA: Diagnosis not present

## 2017-07-15 NOTE — Progress Notes (Signed)
[facility] Visit  @DATE @            Patient name: Nicole Lam MRN [id number]  Date of birth: 11-Dec-1950  CC & HPI:  Nicole Lam is a 66 y.o. female presenting today for follow-up of external hemorrhoid The patient is improved since last week. She is using preparation H plus a 2.5% hydrocortisone cream perianally ROS:  ROS  GU: She is having occasional urge incontinence symptoms and occasional stress,. Not having a's overwhelmingdifficulty with either an right now we would simply follow GI: Normal diet and bowel function Review of systems otherwise negative  Pertinent History Reviewed:   Reviewed: Significant for see listed below Medical         Past Medical History:  Diagnosis Date  . Diabetes ([place])   . Fatty liver   . Hemorrhoid   . Hyperlipidemia   . Hypothyroidism   . Lipoma of neck                               Surgical Hx:    Past Surgical History:  Procedure Laterality Date  . ABDOMINAL HYSTERECTOMY    . APPENDECTOMY    . COLONOSCOPY  05/30/05   HFW:YOVZCH anal papilla, otherwise normal rectum.and colon  . COLONOSCOPY N/A 11/04/2015   RMR: normal colonoscopy  . DILATION AND CURETTAGE OF UTERUS     Medications: Reviewed & Updated - see associated section                       Current Outpatient Prescriptions:  .  acyclovir (ZOVIRAX) 400 MG tablet, Take 1 tablet (400 mg total) by mouth 5 (five) times daily. (Patient taking differently: Take 400 mg by mouth as needed. ), Disp: 25 tablet, Rfl: 6 .  ALPRAZolam (XANAX) 0.25 MG tablet, Take 1 tablet (0.25 mg total) by mouth 2 (two) times daily as needed for anxiety or sleep. (Patient taking differently: Take 0.25 mg by mouth as needed for anxiety or sleep. ), Disp: 30 tablet, Rfl: 5 .  aspirin 81 MG tablet, Take 81 mg by mouth at bedtime. , Disp: , Rfl:  .  diclofenac sodium (VOLTAREN) 1 % GEL, Apply 4 g topically 4 (four) times daily. (Patient taking differently: Apply 4 g topically as needed. ), Disp: 5  Tube, Rfl: 5 .  estradiol (ESTRACE) 0.5 MG tablet, TAKE 1 TABLET EVERY DAY, Disp: 90 tablet, Rfl: 1 .  Glucosamine-Chondroitin (GLUCOSAMINE CHONDR COMPLEX PO), Take by mouth. Chews 3 by mouth daily ( pt unsure of dose), Disp: , Rfl:  .  HYDROcodone-acetaminophen (NORCO) 7.5-325 MG tablet, 1/2 to 1  bid prn (Patient taking differently: 1/2 to 1 prn), Disp: 50 tablet, Rfl: 0 .  hydrocortisone (ANUSOL-HC) 2.5 % rectal cream, Place 1 application rectally 2 (two) times daily., Disp: 30 g, Rfl: 0 .  levothyroxine (SYNTHROID, LEVOTHROID) 88 MCG tablet, Take 1 tablet (88 mcg total) by mouth daily., Disp: 30 tablet, Rfl: 5 .  losartan (COZAAR) 25 MG tablet, TAKE 1 TABLET BY MOUTH ONCE DAILY, Disp: 90 tablet, Rfl: 1 .  meloxicam (MOBIC) 15 MG tablet, TAKE 1 TABLET EVERY DAY, Disp: 90 tablet, Rfl: 3 .  metFORMIN (GLUCOPHAGE) 500 MG tablet, TAKE 2 TABLETS IN THE MORNING  AND TAKE 2 TABLETS IN THE EVENING WITH MEALS, Disp: 360 tablet, Rfl: 5 .  naproxen sodium (ANAPROX) 220 MG tablet, Take 440 mg by mouth  once as needed (for pain)., Disp: , Rfl:  .  OVER THE COUNTER MEDICATION, Joint Juice by mouth daily, Disp: , Rfl:  .  pantoprazole (PROTONIX) 40 MG tablet, Take 1 tablet (40 mg total) by mouth daily., Disp: 90 tablet, Rfl: 1 .  phenylephrine-shark liver oil-mineral oil-petrolatum (PREPARATION H) 0.25-3-14-71.9 % rectal ointment, Place 1 application rectally 2 (two) times daily as needed for hemorrhoids., Disp: , Rfl:  .  polyethylene glycol (MIRALAX / GLYCOLAX) packet, Take 17 g by mouth at bedtime. , Disp: , Rfl:  .  pravastatin (PRAVACHOL) 40 MG tablet, Take 1 tablet (40 mg total) by mouth daily., Disp: 90 tablet, Rfl: 1 .  sitaGLIPtin (JANUVIA) 50 MG tablet, Take 1 tablet (50 mg total) by mouth daily., Disp: 30 tablet, Rfl: 5   Social History: Reviewed -  reports that she has never smoked. She has never used smokeless tobacco.  Objective Findings:  Vitals: Blood pressure 132/70, pulse 71, height 5'  (1.524 m), weight 129 lb 9.6 oz (58.8 kg).  Physical Examination: General appearance - alert, well appearing, and in no distress, oriented to person, place, and time and normal appearing weight Mental status - alert, oriented to person, place, and time, normal mood, behavior, speech, dress, motor activity, and thought processes, affect appropriate to mood Ears - normal Chest - clear to auscultation, no wheezes, rales or rhonchi, symmetric air entry, no tachypnea, retractions or cyanosis Abdomen - soft, nontender, nondistended, no masses or organomegaly Pelvic - VULVA: vulvar appearance normal, vulvar mass none, , VAGINA: normal appearing vagina with normal color and discharge, no lesions, CERVIX: normal appearing cervix without discharge or lesions, UTERUS: uterus is normal size, shape, consistency and nontender, ADNEXA: normal adnexa in size, nontender and no masses, exam chaperoned by Marcie Bal Extremities - peripheral pulses normal, no pedal edema, no clubbing or cyanosis   Assessment & Plan:   A:  1. Resolving external thrombosed hemorrhoid 2.Mild stress and urge incontinence managed conservatively and stable, not requiring pads or medications at this time  P:  1. Continue current therapy for hemorrhoid 2. Patient will let us know if the stress incontinence or urge incontinence become a morbid inconvenience

## 2017-08-08 DIAGNOSIS — E119 Type 2 diabetes mellitus without complications: Secondary | ICD-10-CM | POA: Diagnosis not present

## 2017-08-08 DIAGNOSIS — E785 Hyperlipidemia, unspecified: Secondary | ICD-10-CM | POA: Diagnosis not present

## 2017-08-08 DIAGNOSIS — K760 Fatty (change of) liver, not elsewhere classified: Secondary | ICD-10-CM | POA: Diagnosis not present

## 2017-08-08 DIAGNOSIS — E039 Hypothyroidism, unspecified: Secondary | ICD-10-CM | POA: Diagnosis not present

## 2017-08-08 DIAGNOSIS — R945 Abnormal results of liver function studies: Secondary | ICD-10-CM | POA: Diagnosis not present

## 2017-08-09 LAB — HEPATIC FUNCTION PANEL
ALBUMIN: 4.7 g/dL (ref 3.6–4.8)
ALT: 51 IU/L — ABNORMAL HIGH (ref 0–32)
AST: 47 IU/L — AB (ref 0–40)
Alkaline Phosphatase: 71 IU/L (ref 39–117)
Bilirubin Total: 0.6 mg/dL (ref 0.0–1.2)
Bilirubin, Direct: 0.14 mg/dL (ref 0.00–0.40)
Total Protein: 7.2 g/dL (ref 6.0–8.5)

## 2017-08-09 LAB — CBC WITH DIFFERENTIAL/PLATELET
BASOS ABS: 0 10*3/uL (ref 0.0–0.2)
Basos: 1 %
EOS (ABSOLUTE): 0.2 10*3/uL (ref 0.0–0.4)
Eos: 4 %
Hematocrit: 36.3 % (ref 34.0–46.6)
Hemoglobin: 11.6 g/dL (ref 11.1–15.9)
IMMATURE GRANULOCYTES: 0 %
Immature Grans (Abs): 0 10*3/uL (ref 0.0–0.1)
LYMPHS ABS: 1.9 10*3/uL (ref 0.7–3.1)
Lymphs: 41 %
MCH: 25.6 pg — ABNORMAL LOW (ref 26.6–33.0)
MCHC: 32 g/dL (ref 31.5–35.7)
MCV: 80 fL (ref 79–97)
MONOS ABS: 0.3 10*3/uL (ref 0.1–0.9)
Monocytes: 7 %
NEUTROS PCT: 47 %
Neutrophils Absolute: 2.1 10*3/uL (ref 1.4–7.0)
PLATELETS: 308 10*3/uL (ref 150–379)
RBC: 4.53 x10E6/uL (ref 3.77–5.28)
RDW: 16.3 % — AB (ref 12.3–15.4)
WBC: 4.6 10*3/uL (ref 3.4–10.8)

## 2017-08-09 LAB — LIPID PANEL
CHOL/HDL RATIO: 5.3 ratio — AB (ref 0.0–4.4)
Cholesterol, Total: 189 mg/dL (ref 100–199)
HDL: 36 mg/dL — AB (ref >39)
LDL Calculated: 114 mg/dL — ABNORMAL HIGH (ref 0–99)
TRIGLYCERIDES: 195 mg/dL — AB (ref 0–149)
VLDL Cholesterol Cal: 39 mg/dL (ref 5–40)

## 2017-08-09 LAB — HEMOGLOBIN A1C
ESTIMATED AVERAGE GLUCOSE: 148 mg/dL
HEMOGLOBIN A1C: 6.8 % — AB (ref 4.8–5.6)

## 2017-08-09 LAB — TSH: TSH: 0.115 u[IU]/mL — ABNORMAL LOW (ref 0.450–4.500)

## 2017-08-13 ENCOUNTER — Telehealth: Payer: Self-pay | Admitting: Family Medicine

## 2017-08-13 NOTE — Telephone Encounter (Signed)
Nicole Lam said that a nurse tried calling her this morning.  She is requesting a call back on her mobile number after 4:30 pm today.

## 2017-08-14 ENCOUNTER — Other Ambulatory Visit: Payer: Self-pay | Admitting: *Deleted

## 2017-08-14 ENCOUNTER — Encounter: Payer: Self-pay | Admitting: *Deleted

## 2017-08-14 NOTE — Telephone Encounter (Signed)
Discussed with pt. See lab results.

## 2017-08-15 NOTE — Progress Notes (Signed)
Platelets normal. Return in March.

## 2017-08-26 ENCOUNTER — Ambulatory Visit: Payer: Medicare HMO | Admitting: Family Medicine

## 2017-08-28 ENCOUNTER — Telehealth: Payer: Self-pay | Admitting: Family Medicine

## 2017-08-28 NOTE — Telephone Encounter (Signed)
Patient going to check with store to see if they will be replacing with a new lot number or see if she can have it transferred to another pharmacy.

## 2017-08-28 NOTE — Telephone Encounter (Signed)
2 choices: get a different generic Losartan from another pharmacy if available or switch to a similar med

## 2017-08-28 NOTE — Telephone Encounter (Signed)
Pt called stating that there is a recall on Losartan and is wanting to know what she needs to do. Please advise.     [facility]

## 2017-08-28 NOTE — Telephone Encounter (Signed)
Spoke with pharmacy and their Losartan was part of the recall

## 2017-08-29 ENCOUNTER — Other Ambulatory Visit: Payer: Self-pay | Admitting: Family Medicine

## 2017-08-29 ENCOUNTER — Other Ambulatory Visit: Payer: Self-pay | Admitting: Obstetrics and Gynecology

## 2017-09-12 ENCOUNTER — Other Ambulatory Visit: Payer: Self-pay | Admitting: Family Medicine

## 2017-09-27 ENCOUNTER — Other Ambulatory Visit: Payer: Self-pay | Admitting: Family Medicine

## 2017-10-09 ENCOUNTER — Other Ambulatory Visit: Payer: Self-pay | Admitting: Family Medicine

## 2017-10-24 ENCOUNTER — Other Ambulatory Visit: Payer: Self-pay | Admitting: Orthopedic Surgery

## 2017-10-24 DIAGNOSIS — M25562 Pain in left knee: Secondary | ICD-10-CM

## 2017-10-28 ENCOUNTER — Telehealth: Payer: Self-pay | Admitting: Radiology

## 2017-10-28 DIAGNOSIS — M25562 Pain in left knee: Secondary | ICD-10-CM

## 2017-10-28 MED ORDER — DICLOFENAC SODIUM 1 % TD GEL: 4.0000 g | Freq: Four times a day (QID) | TRANSDERMAL | 5 refills | Status: AC

## 2017-10-28 NOTE — Telephone Encounter (Signed)
You sent in voltaren gel, dispense as written  They will fill diclofenac gel, ok to substitute generic?

## 2017-10-28 NOTE — Addendum Note (Signed)
Addended byCandice Camp on: 10/28/2017 02:58 PM   Modules accepted: Orders

## 2017-10-28 NOTE — Telephone Encounter (Signed)
yes

## 2017-11-26 ENCOUNTER — Other Ambulatory Visit: Payer: Self-pay | Admitting: Family Medicine

## 2017-12-02 ENCOUNTER — Other Ambulatory Visit: Payer: Self-pay | Admitting: Family Medicine

## 2017-12-09 ENCOUNTER — Other Ambulatory Visit: Payer: Self-pay | Admitting: Family Medicine

## 2017-12-18 ENCOUNTER — Ambulatory Visit (INDEPENDENT_AMBULATORY_CARE_PROVIDER_SITE_OTHER): Payer: Medicare HMO | Admitting: Family Medicine

## 2017-12-18 ENCOUNTER — Encounter: Payer: Self-pay | Admitting: Family Medicine

## 2017-12-18 VITALS — BP 148/80 | Ht <= 58 in | Wt 132.0 lb

## 2017-12-18 DIAGNOSIS — E7849 Other hyperlipidemia: Secondary | ICD-10-CM

## 2017-12-18 DIAGNOSIS — M5442 Lumbago with sciatica, left side: Secondary | ICD-10-CM

## 2017-12-18 DIAGNOSIS — Z79899 Other long term (current) drug therapy: Secondary | ICD-10-CM | POA: Diagnosis not present

## 2017-12-18 DIAGNOSIS — E119 Type 2 diabetes mellitus without complications: Secondary | ICD-10-CM

## 2017-12-18 DIAGNOSIS — G8929 Other chronic pain: Secondary | ICD-10-CM | POA: Diagnosis not present

## 2017-12-18 DIAGNOSIS — E039 Hypothyroidism, unspecified: Secondary | ICD-10-CM

## 2017-12-18 DIAGNOSIS — K760 Fatty (change of) liver, not elsewhere classified: Secondary | ICD-10-CM | POA: Diagnosis not present

## 2017-12-18 LAB — POCT GLYCOSYLATED HEMOGLOBIN (HGB A1C): HEMOGLOBIN A1C: 6.4

## 2017-12-18 MED ORDER — LOSARTAN POTASSIUM 50 MG PO TABS: 50.0000 mg | ORAL_TABLET | Freq: Every day | ORAL | 5 refills | Status: DC

## 2017-12-18 MED ORDER — HYDROCODONE-ACETAMINOPHEN 7.5-325 MG PO TABS: ORAL_TABLET | ORAL | 0 refills | Status: DC

## 2017-12-18 NOTE — Progress Notes (Signed)
Subjective:    Patient ID: Nicole Lam, female    DOB: 1951/07/27, 67 y.o.   MRN: [id number]  HPI Patient is here today to follow up on her Dm . She currently takes Metformin 500 mg BID, Januuvia 50 mg one daily.She eats healthy and does not get much exercise. She does not see any specialist.Sees and eye Dr. Freda Munro, no podiatrist. Does not need refills.May need refills on Hydrocodone 7.5-325 1/2 tablet one per month as needed.  The patient was seen today as part of a comprehensive diabetic check up.The patient relates medication compliance. No significant side effects to the medications. Denies any low glucose spells. Relates compliance with diet to a reasonable level. Patient does do labwork intermittently and understands the dangers of diabetes.  Patient has thyroid condition.  Takes thyroid medication on a regular basis.  States that the proper way.  Relates compliance.  States no negative side effects.  States condition seems to be under good control.  Patient does have ongoing trouble with reflux.  Takes medication on a regular basis.  Tries to minimize foods as best they can.  They understand the importance of dietary compliance.  May also try to avoid eating a large meal close to bedtime.  Patient denies any dysphagia denies hematochezia.  States medicine does a good job keeping the problem under good control.  Without the medication may certainly have issues.They desire to continue taking their medication.  Patient for blood pressure check up. Patient relates compliance with meds. Todays BP reviewed with the patient. Patient denies issues with medication. Patient relates reasonable diet. Patient tries to minimize salt. Patient aware of BP goals.  Patient with low back pain radiates down the legs intermittent does stretches is not surgical relates that she does try to do the best she can being careful with lifting she would like to have a prescription of hydrocodone to use occasionally  she does not use it frequently she denies abusing drug registry checked  Fatty liver with elevated liver enzymes recent ultrasound showed fatty liver no sign of cirrhosis patient denies hemoptysis or rectal bleeding  Review of Systems  Constitutional: Negative for activity change, appetite change and fatigue.  HENT: Negative for congestion.   Respiratory: Negative for cough.   Cardiovascular: Negative for chest pain.  Gastrointestinal: Negative for abdominal pain.  Endocrine: Negative for polydipsia and polyphagia.  Musculoskeletal: Positive for arthralgias and back pain.  Skin: Negative for color change.  Neurological: Negative for weakness.  Psychiatric/Behavioral: Negative for confusion.       Results for orders placed or performed in visit on 12/18/17  POCT glycosylated hemoglobin (Hb A1C)  Result Value Ref Range   Hemoglobin A1C 6.4     Objective:   Physical Exam  Constitutional: She appears well-developed and well-nourished. No distress.  HENT:  Head: Normocephalic and atraumatic.  Eyes: Right eye exhibits no discharge. Left eye exhibits no discharge.  Neck: No tracheal deviation present.  Cardiovascular: Normal rate, regular rhythm and normal heart sounds.  No murmur heard. Pulmonary/Chest: Effort normal and breath sounds normal. No respiratory distress. She has no wheezes. She has no rales.  Musculoskeletal: She exhibits no edema.  Lymphadenopathy:    She has no cervical adenopathy.  Neurological: She is alert. She exhibits normal muscle tone.  Skin: Skin is warm and dry. No erythema.  Psychiatric: Her behavior is normal.  Vitals reviewed.   Patient with intermittent left-sided neck pain in the posterior portion of her neck when she  turns in certain directions or looks up been going on for several months does not radiate down the arms physical exam unrevealing stretching exercises should gradually get better certainly follow-up if progressive troubles or  numbness      Assessment & Plan:  The patient was seen today as part of a comprehensive visit for diabetes. The importance of keeping her A1c at or below 7 was discussed. Importance of regular physical activity was discussed. Proper monitoring of glucose levels with glucometer discussed. The importance of adherence to medication as well as a controlled low starch/sugar diet was also discussed. Also discussion regarding the importance of diabetic foot checks including self check every day. Also yearly diabetic eye exams recommended. The importance of keeping blood pressure under control and keeping LDL below 70 was also discussed. Also the importance of avoiding smoking. Standard follow-up visit recommended. Finally failure to follow good diabetic measures including self effort and compliance with recommendations can certainly increase the risk of heart disease strokes kidney failure blindness loss of limb and early death was discussed with the patient.  HTN- Patient was seen today as part of a visit regarding hypertension. The importance of healthy diet and regular physical activity was discussed. The importance of compliance with medications discussed. Ideal goal is to keep blood pressure low elevated levels certainly below 428/76 when possible. The patient was counseled that keeping blood pressure under control lessen his risk of heart attack, stroke, kidney failure, and early death. The importance of regular follow-ups was discussed with the patient. Low-salt diet such as DASH recommended. Regular physical activity was recommended as well. Patient was advised to keep regular follow-ups.  The patient was seen today as part of an evaluation regarding hyperlipidemia. Recent lab work has been reviewed with the patient as well as the goals for good cholesterol care. In addition to this medications have been discussed the importance of compliance with diet and medications discussed as well. Patient has been  informed of potential side effects of medications in the importance to notify us should any problems occur. Finally the patient is aware that poor control of cholesterol, noncompliance can dramatically increase her risk of heart attack strokes and premature death. The patient will keep regular office visits and the patient does agreed to periodic lab work.  Hypothyroidism patient to continue taking medication check lab work await the results previous labs reviewed  Chronic low back pain stretching exercises shown hydrocodone for severe pain cautioned drowsiness not for frequent use  Intermittent left-sided neck pain stretching exercises follow-up if ongoing troubles  Follow-up 6 months

## 2017-12-19 LAB — HEPATIC FUNCTION PANEL
ALK PHOS: 73 IU/L (ref 39–117)
ALT: 54 IU/L — AB (ref 0–32)
AST: 48 IU/L — ABNORMAL HIGH (ref 0–40)
Albumin: 4.4 g/dL (ref 3.6–4.8)
BILIRUBIN TOTAL: 0.5 mg/dL (ref 0.0–1.2)
BILIRUBIN, DIRECT: 0.13 mg/dL (ref 0.00–0.40)
Total Protein: 7.1 g/dL (ref 6.0–8.5)

## 2017-12-19 LAB — TSH: TSH: 0.42 u[IU]/mL — AB (ref 0.450–4.500)

## 2017-12-19 LAB — LIPID PANEL
Chol/HDL Ratio: 4.7 ratio — ABNORMAL HIGH (ref 0.0–4.4)
Cholesterol, Total: 185 mg/dL (ref 100–199)
HDL: 39 mg/dL — AB (ref >39)
LDL Calculated: 107 mg/dL — ABNORMAL HIGH (ref 0–99)
TRIGLYCERIDES: 195 mg/dL — AB (ref 0–149)
VLDL Cholesterol Cal: 39 mg/dL (ref 5–40)

## 2017-12-19 LAB — BASIC METABOLIC PANEL
BUN/Creatinine Ratio: 12 (ref 12–28)
BUN: 11 mg/dL (ref 8–27)
CALCIUM: 9.8 mg/dL (ref 8.7–10.3)
CHLORIDE: 101 mmol/L (ref 96–106)
CO2: 26 mmol/L (ref 20–29)
Creatinine, Ser: 0.9 mg/dL (ref 0.57–1.00)
GFR calc Af Amer: 76 mL/min/{1.73_m2} (ref >59)
GFR calc non Af Amer: 66 mL/min/{1.73_m2} (ref >59)
GLUCOSE: 133 mg/dL — AB (ref 65–99)
POTASSIUM: 4.5 mmol/L (ref 3.5–5.2)
SODIUM: 141 mmol/L (ref 134–144)

## 2017-12-23 ENCOUNTER — Ambulatory Visit (INDEPENDENT_AMBULATORY_CARE_PROVIDER_SITE_OTHER): Payer: Medicare HMO | Admitting: Family Medicine

## 2017-12-23 ENCOUNTER — Ambulatory Visit (INDEPENDENT_AMBULATORY_CARE_PROVIDER_SITE_OTHER): Payer: Medicare HMO | Admitting: Gastroenterology

## 2017-12-23 ENCOUNTER — Encounter: Payer: Self-pay | Admitting: Family Medicine

## 2017-12-23 ENCOUNTER — Encounter: Payer: Self-pay | Admitting: Gastroenterology

## 2017-12-23 VITALS — BP 143/72 | HR 92 | Temp 97.1°F | Ht 59.5 in | Wt 130.0 lb

## 2017-12-23 VITALS — BP 122/70 | Temp 98.0°F | Ht 59.5 in | Wt 130.0 lb

## 2017-12-23 DIAGNOSIS — J019 Acute sinusitis, unspecified: Secondary | ICD-10-CM | POA: Diagnosis not present

## 2017-12-23 DIAGNOSIS — K760 Fatty (change of) liver, not elsewhere classified: Secondary | ICD-10-CM | POA: Diagnosis not present

## 2017-12-23 MED ORDER — AMOXICILLIN-POT CLAVULANATE 875-125 MG PO TABS
1.0000 | ORAL_TABLET | Freq: Two times a day (BID) | ORAL | 0 refills | Status: DC
Start: 2017-12-23 — End: 2018-07-18

## 2017-12-23 NOTE — Patient Instructions (Addendum)
Sinusitis, Adult Sinusitis is soreness and inflammation of your sinuses. Sinuses are hollow spaces in the bones around your face. They are located:  Around your eyes.  In the middle of your forehead.  Behind your nose.  In your cheekbones.  Your sinuses and nasal passages are lined with a stringy fluid (mucus). Mucus normally drains out of your sinuses. When your nasal tissues get inflamed or swollen, the mucus can get trapped or blocked so air cannot flow through your sinuses. This lets bacteria, viruses, and funguses grow, and that leads to infection. Follow these instructions at home: Medicines  Take, use, or apply over-the-counter and prescription medicines only as told by your doctor. These may include nasal sprays.  If you were prescribed an antibiotic medicine, take it as told by your doctor. Do not stop taking the antibiotic even if you start to feel better. Hydrate and Humidify  Drink enough water to keep your pee (urine) clear or pale yellow.  Use a cool mist humidifier to keep the humidity level in your home above 50%.  Breathe in steam for 10-15 minutes, 3-4 times a day or as told by your doctor. You can do this in the bathroom while a hot shower is running.  Try not to spend time in cool or dry air. Rest  Rest as much as possible.  Sleep with your head raised (elevated).  Make sure to get enough sleep each night. General instructions  Put a warm, moist washcloth on your face 3-4 times a day or as told by your doctor. This will help with discomfort.  Wash your hands often with soap and water. If there is no soap and water, use hand sanitizer.  Do not smoke. Avoid being around people who are smoking (secondhand smoke).  Keep all follow-up visits as told by your doctor. This is important. Contact a doctor if:  You have a fever.  Your symptoms get worse.  Your symptoms do not get better within 10 days. Get help right away if:  You have a very bad  headache.  You cannot stop throwing up (vomiting).  You have pain or swelling around your face or eyes.  You have trouble seeing.  You feel confused.  Your neck is stiff.  You have trouble breathing. This information is not intended to replace advice given to you by your health care provider. Make sure you discuss any questions you have with your health care provider. Document Released: 03/19/2008 Document Revised: 05/27/2016 Document Reviewed: 07/27/2015 Elsevier Interactive Patient Education  2018 [employer].  Results for orders placed or performed in visit on 12/18/17  TSH  Result Value Ref Range   TSH 0.420 (L) 0.450 - 4.500 uIU/mL  Basic metabolic panel  Result Value Ref Range   Glucose 133 (H) 65 - 99 mg/dL   BUN 11 8 - 27 mg/dL   Creatinine, Ser 0.90 0.57 - 1.00 mg/dL   GFR calc non Af Amer 66 >59 mL/min/1.73   GFR calc Af Amer 76 >59 mL/min/1.73   BUN/Creatinine Ratio 12 12 - 28   Sodium 141 134 - 144 mmol/L   Potassium 4.5 3.5 - 5.2 mmol/L   Chloride 101 96 - 106 mmol/L   CO2 26 20 - 29 mmol/L   Calcium 9.8 8.7 - 10.3 mg/dL  Lipid panel  Result Value Ref Range   Cholesterol, Total 185 100 - 199 mg/dL   Triglycerides 195 (H) 0 - 149 mg/dL   HDL 39 (L) >39 mg/dL  VLDL Cholesterol Cal 39 5 - 40 mg/dL   LDL Calculated 107 (H) 0 - 99 mg/dL   Chol/HDL Ratio 4.7 (H) 0.0 - 4.4 ratio  Hepatic function panel  Result Value Ref Range   Total Protein 7.1 6.0 - 8.5 g/dL   Albumin 4.4 3.6 - 4.8 g/dL   Bilirubin Total 0.5 0.0 - 1.2 mg/dL   Bilirubin, Direct 0.13 0.00 - 0.40 mg/dL   Alkaline Phosphatase 73 39 - 117 IU/L   AST 48 (H) 0 - 40 IU/L   ALT 54 (H) 0 - 32 IU/L  POCT glycosylated hemoglobin (Hb A1C)  Result Value Ref Range   Hemoglobin A1C 6.4

## 2017-12-23 NOTE — Progress Notes (Signed)
Referring Provider: Kathyrn Drown, MD Primary Care Physician:  Kathyrn Drown, MD  Chief Complaint  Patient presents with  . abnormal lft's    HPI:   Nicole Lam is a 67 y.o. female presenting today with a history of mildly elevated transaminases, fatty liver on ultrasound with Metavir score F2/F3. Likely dealing with [name]. Serologies in 2017 with negative Hep C, negative Hep B surface antigen, normal ceruloplasmin, AMA/ASMA negative, celiac serologies negative. US abdomen Sept 2018 with fatty liver, question mild hepatomegaly. No evidence of iron overload.   Miralax 3-4 times per week. Some weeks not at all. No abdominal pain, N/V. No mental status changes or confusion. Recent transaminases stable from 4 months ago and overall improved. Mildly elevated. No stigmata of advanced liver disease. [place] on file from Sept 2018.   Past Medical History:  Diagnosis Date  . Diabetes ([place])   . Fatty liver   . Hemorrhoid   . Hyperlipidemia   . Hypothyroidism   . Lipoma of neck     Past Surgical History:  Procedure Laterality Date  . ABDOMINAL HYSTERECTOMY    . APPENDECTOMY    . COLONOSCOPY  05/30/05   NID:[id number] anal papilla, otherwise normal rectum.and colon  . COLONOSCOPY N/A 11/04/2015   RMR: normal colonoscopy  . DILATION AND CURETTAGE OF UTERUS      Current Outpatient Medications  Medication Sig Dispense Refill  . acyclovir (ZOVIRAX) 400 MG tablet Take 1 tablet (400 mg total) by mouth 5 (five) times daily. (Patient taking differently: Take 400 mg by mouth as needed. ) 25 tablet 6  . ALPRAZolam (XANAX) 0.25 MG tablet Take 1 tablet (0.25 mg total) by mouth 2 (two) times daily as needed for anxiety or sleep. (Patient taking differently: Take 0.25 mg by mouth as needed for anxiety or sleep. ) 30 tablet 5  . aspirin 81 MG tablet Take 81 mg by mouth at bedtime.     . diclofenac sodium (VOLTAREN) 1 % GEL Apply 4 g topically 4 (four) times daily. (Patient taking differently: Apply 4 g  topically as needed. ) 5 Tube 5  . diphenhydrAMINE (BENADRYL) 25 mg capsule Take 25 mg by mouth at bedtime.    Marland Kitchen estradiol (ESTRACE) 0.5 MG tablet TAKE 1 TABLET EVERY DAY 90 tablet 1  . Glucosamine-Chondroitin (GLUCOSAMINE CHONDR COMPLEX PO) Take by mouth. Chews 3 by mouth daily ( pt unsure of dose)    . HYDROcodone-acetaminophen (NORCO) 7.5-325 MG tablet 1 q4 hours prn 24 tablet 0  . JANUVIA 50 MG tablet TAKE 1 TABLET BY MOUTH ONCE DAILY 30 tablet 0  . levothyroxine (SYNTHROID, LEVOTHROID) 88 MCG tablet TAKE 1 TABLET BY MOUTH ONCE DAILY (Patient taking differently: TAKE 1 TABLET BY MOUTH ONCE DAILY. Takes 1/2 tablet Monday and Friday) 30 tablet 5  . losartan (COZAAR) 50 MG tablet Take 1 tablet (50 mg total) by mouth daily. 30 tablet 5  . metFORMIN (GLUCOPHAGE) 500 MG tablet TAKE 2 TABLETS IN THE MORNING  AND TAKE 2 TABLETS IN THE EVENING WITH MEALS 360 tablet 5  . naproxen sodium (ANAPROX) 220 MG tablet Take 440 mg by mouth once as needed (for pain).    Marland Kitchen OVER THE COUNTER MEDICATION Joint Juice by mouth daily    . pantoprazole (PROTONIX) 40 MG tablet TAKE 1 TABLET (40 MG TOTAL) BY MOUTH DAILY. 90 tablet 1  . phenylephrine-shark liver oil-mineral oil-petrolatum (PREPARATION H) 0.25-3-14-71.9 % rectal ointment Place 1 application rectally 2 (two) times daily as  needed for hemorrhoids.    . polyethylene glycol (MIRALAX / GLYCOLAX) packet Take 17 g by mouth daily as needed.     . pravastatin (PRAVACHOL) 40 MG tablet TAKE 1 TABLET EVERY DAY.   APPOINTMENT IS NEEDED 30 tablet 5  . pseudoephedrine (SUDAFED) 30 MG tablet Take 30 mg by mouth every 4 (four) hours as needed for congestion.    . VOLTAREN 1 % GEL APPLY 4 GRAMS FOUR TIMES DAILY (Patient taking differently: APPLY 4 GRAMS FOUR TIMES DAILY. Takes as needed) 5 Tube 5  . hydrocortisone (ANUSOL-HC) 2.5 % rectal cream Place 1 application rectally 2 (two) times daily. (Patient not taking: Reported on 12/23/2017) 30 g 0  . meloxicam (MOBIC) 15 MG tablet  TAKE 1 TABLET EVERY DAY (Patient not taking: Reported on 12/23/2017) 90 tablet 3   No current facility-administered medications for this visit.     Allergies as of 12/23/2017  . (No Known Allergies)    Family History  Problem Relation Age of Onset  . Heart disease Mother   . Hypertension Mother   . Birth defects Daughter   . Colon cancer Neg Hx   . Liver disease Neg Hx     Social History   Socioeconomic History  . Marital status: Married    Spouse name: None  . Number of children: None  . Years of education: None  . Highest education level: None  Social Needs  . Financial resource strain: None  . Food insecurity - worry: None  . Food insecurity - inability: None  . Transportation needs - medical: None  . Transportation needs - non-medical: None  Occupational History  . None  Tobacco Use  . Smoking status: Never Smoker  . Smokeless tobacco: Never Used  Substance and Sexual Activity  . Alcohol use: No    Alcohol/week: 0.0 oz  . Drug use: No  . Sexual activity: Yes    Birth control/protection: Surgical    Comment: hyst  Other Topics Concern  . None  Social History Narrative  . None    Review of Systems: Gen: Denies fever, chills, anorexia. Denies fatigue, weakness, weight loss.  CV: Denies chest pain, palpitations, syncope, peripheral edema, and claudication. Resp: Denies dyspnea at rest, cough, wheezing, coughing up blood, and pleurisy. GI: see HPI  Derm: Denies rash, itching, dry skin Psych: Denies depression, anxiety, memory loss, confusion. No homicidal or suicidal ideation.  Heme: Denies bruising, bleeding, and enlarged lymph nodes.  Physical Exam: BP (!) 143/72   Pulse 92   Temp (!) 97.1 F (36.2 C) (Oral)   Ht 4' 11.5" (1.511 m)   Wt 130 lb (59 kg)   BMI 25.82 kg/m  General:   Alert and oriented. No distress noted. Pleasant and cooperative.  Head:  Normocephalic and atraumatic. Eyes:  Conjuctiva clear without scleral icterus. Mouth:  Oral  mucosa pink and moist.  Abdomen:  +BS, soft, non-tender and non-distended. No rebound or guarding. No HSM or masses noted. Msk:  Symmetrical without gross deformities. Normal posture. Extremities:  Without edema. Neurologic:  Alert and  oriented x4 Psych:  Alert and cooperative. Normal mood and affect.  Lab Results  Component Value Date   WBC 4.6 08/08/2017   HGB 11.6 08/08/2017   HCT 36.3 08/08/2017   MCV 80 08/08/2017   PLT 308 08/08/2017    Lab Results  Component Value Date   ALT 54 (H) 12/18/2017   AST 48 (H) 12/18/2017   ALKPHOS 73 12/18/2017   BILITOT 0.5  12/18/2017   Lab Results  Component Value Date   CREATININE 0.90 12/18/2017   BUN 11 12/18/2017   NA 141 12/18/2017   K 4.5 12/18/2017   CL 101 12/18/2017   CO2 26 12/18/2017

## 2017-12-23 NOTE — Progress Notes (Signed)
   Subjective:    Patient ID: Nicole Lam, female    DOB: May 08, 1951, 67 y.o.   MRN: [id number]  Cough  This is a new problem. The current episode started in the past 7 days. Associated symptoms include headaches, nasal congestion and rhinorrhea. Pertinent negatives include no chest pain, ear pain, fever, shortness of breath or wheezing. Treatments tried: sudafed and benadryl.   Sinus symptoms congestion drainage coughing She also had recent lab work done she inquires about the results of this She states she takes her medicine as directed  Review of Systems  Constitutional: Negative for activity change and fever.  HENT: Positive for congestion and rhinorrhea. Negative for ear pain.   Eyes: Negative for discharge.  Respiratory: Positive for cough. Negative for shortness of breath and wheezing.   Cardiovascular: Negative for chest pain.  Neurological: Positive for headaches.       Objective:   Physical Exam  Constitutional: She appears well-developed.  HENT:  Head: Normocephalic.  Right Ear: External ear normal.  Left Ear: External ear normal.  Nose: Nose normal.  Mouth/Throat: Oropharynx is clear and moist. No oropharyngeal exudate.  Eyes: Right eye exhibits no discharge. Left eye exhibits no discharge.  Neck: Neck supple. No tracheal deviation present.  Cardiovascular: Normal rate and normal heart sounds.  No murmur heard. Pulmonary/Chest: Effort normal and breath sounds normal. She has no wheezes. She has no rales.  Lymphadenopathy:    She has no cervical adenopathy.  Skin: Skin is warm and dry.  Nursing note and vitals reviewed.         Assessment & Plan:  I reviewed the lab work with her Continue current medication More lab work in the fall with follow-up visit 6 months  Fatty liver-follow through with gastroenterology watch diet try to lose weight Hyperlipidemia cannot tolerate any higher statin Diabetes decent control continue current measures Blood  pressure good control continue current measures  Sinusitis antibiotics prescribed warning signs discussed follow-up if progressive troubles or worse

## 2017-12-23 NOTE — Patient Instructions (Signed)
We will see you in 6 months! At that time, we will likely update your ultrasound.  I don't recommend a liver biopsy unless your liver numbers start increasing. We will continue to follow this closely and see you in 6 months!  It was a pleasure to see you today. I strive to create trusting relationships with patients to provide genuine, compassionate, and quality care. I value your feedback. If you receive a survey regarding your visit,  I greatly appreciate you taking time to fill this out.   Annitta Needs, PhD, ANP-BC [facility]

## 2017-12-24 ENCOUNTER — Encounter: Payer: Self-pay | Admitting: Internal Medicine

## 2017-12-24 NOTE — Assessment & Plan Note (Signed)
67 year old female with mildly elevated transaminases, known fatty liver, no obvious stigmata of advanced liver disease on imaging or laboratory indices. Continue dietary and behavior modification, continue follow-up with PCP for management of other comorbidities (diabetes, hypercholesterolemia, etc). Return in 6 months. Consider updated [place] at that time. If bump in transaminases, would pursue liver biopsy, otherwise continue to serially follow.

## 2017-12-24 NOTE — Addendum Note (Signed)
Addended by: Karle Barr on: 12/24/2017 10:01 AM   Modules accepted: Orders

## 2017-12-25 ENCOUNTER — Ambulatory Visit: Payer: Medicare HMO | Admitting: Gastroenterology

## 2017-12-25 NOTE — Progress Notes (Signed)
cc'ed to pcp °

## 2017-12-28 ENCOUNTER — Other Ambulatory Visit: Payer: Self-pay | Admitting: Family Medicine

## 2017-12-31 ENCOUNTER — Other Ambulatory Visit: Payer: Self-pay | Admitting: *Deleted

## 2017-12-31 MED ORDER — SITAGLIPTIN PHOSPHATE 50 MG PO TABS: ORAL_TABLET | ORAL | 0 refills | Status: DC

## 2018-01-20 ENCOUNTER — Other Ambulatory Visit: Payer: Self-pay | Admitting: Family Medicine

## 2018-02-04 ENCOUNTER — Other Ambulatory Visit: Payer: Self-pay | Admitting: Obstetrics and Gynecology

## 2018-02-04 NOTE — Telephone Encounter (Signed)
Refilled x 6 months estradiol.Marland Kitchen 0.5 mg daily

## 2018-02-05 ENCOUNTER — Other Ambulatory Visit: Payer: Self-pay | Admitting: Family Medicine

## 2018-02-05 NOTE — Telephone Encounter (Signed)
Patient has both 25 and 50 mg are on her med list. I called the patient she states she is taking 50 mg of  The losartan. Please advise.[facility]) she states she has enough medication at this time to confirm with you which dose you want her on. I tried looking back at the office notes and did not see when it was changed.

## 2018-02-05 NOTE — Telephone Encounter (Signed)
I called and left a message with the pt spouse asked that he have her r/c.there is a discrepancy in the losartan. Is it 25 or 50 mg?

## 2018-02-06 ENCOUNTER — Telehealth: Payer: Self-pay | Admitting: Family Medicine

## 2018-02-06 ENCOUNTER — Other Ambulatory Visit: Payer: Self-pay | Admitting: Family Medicine

## 2018-02-06 NOTE — Telephone Encounter (Signed)
Pt called stating that she spoke with the pharmacy and is needing 50 mg of losartan called in with a 90 day supply.     [place]

## 2018-02-06 NOTE — Telephone Encounter (Signed)
I had sent you another note on this as there was a discrepancy on the medication dose, whether 25 or 50 mg. Pt was here today with her husband and stated she was going to discuss with you. Please advise. Do I call in the 50 mg with 90 day supply as she is requesting?

## 2018-02-07 ENCOUNTER — Other Ambulatory Visit: Payer: Self-pay

## 2018-02-07 MED ORDER — LOSARTAN POTASSIUM 50 MG PO TABS: 50.0000 mg | ORAL_TABLET | Freq: Every day | ORAL | 1 refills | Status: DC

## 2018-02-07 NOTE — Telephone Encounter (Signed)
Sent in to [facility] and pt aware.(50 mg)

## 2018-02-07 NOTE — Telephone Encounter (Signed)
Family was going to clarify regarding the strength in the dosage on the medication then notify us regarding this.  Please keep this as a active message if you have not heard anything from the family by Thursday, May 2 please call them

## 2018-02-20 ENCOUNTER — Other Ambulatory Visit: Payer: Self-pay | Admitting: Obstetrics & Gynecology

## 2018-02-25 ENCOUNTER — Other Ambulatory Visit: Payer: Self-pay | Admitting: Family Medicine

## 2018-04-10 ENCOUNTER — Other Ambulatory Visit: Payer: Self-pay | Admitting: Family Medicine

## 2018-04-30 ENCOUNTER — Other Ambulatory Visit: Payer: Self-pay | Admitting: Family Medicine

## 2018-05-16 ENCOUNTER — Other Ambulatory Visit: Payer: Self-pay | Admitting: Obstetrics and Gynecology

## 2018-05-16 DIAGNOSIS — Z1231 Encounter for screening mammogram for malignant neoplasm of breast: Secondary | ICD-10-CM

## 2018-05-30 ENCOUNTER — Ambulatory Visit (HOSPITAL_COMMUNITY)
Admission: RE | Admit: 2018-05-30 | Discharge: 2018-05-30 | Disposition: A | Discharge status: Home / Self Care | Payer: Medicare HMO | Source: Ambulatory Visit | Attending: Obstetrics and Gynecology | Admitting: Obstetrics and Gynecology

## 2018-05-30 DIAGNOSIS — Z1231 Encounter for screening mammogram for malignant neoplasm of breast: Secondary | ICD-10-CM | POA: Diagnosis not present

## 2018-06-03 ENCOUNTER — Telehealth: Payer: Self-pay | Admitting: *Deleted

## 2018-06-03 NOTE — Telephone Encounter (Signed)
LMOVM that mammogram was normal.

## 2018-06-05 ENCOUNTER — Telehealth: Payer: Self-pay | Admitting: Family Medicine

## 2018-06-05 NOTE — Telephone Encounter (Signed)
Last seen march 2019

## 2018-06-05 NOTE — Telephone Encounter (Signed)
Patient is currently on januvia 50 mg but too expensive and wanting to see if you can prescribe a different medication close to it. Nicole Lam

## 2018-06-05 NOTE — Telephone Encounter (Signed)
The patient is on 50 mg please DC the 25

## 2018-06-09 NOTE — Telephone Encounter (Signed)
Unfortunately there are no similar medications to Januvia If she cannot afford it I would recommend stopping it and taking it off of her epic medication list I would recommend the patient check her sugar periodically morning and evenings write these down on the log sheet and send them to [place] within the next 1 to 2 weeks then I can make a judgment of what other medicine might be added to try to help with her sugars Continue the Metformin Do the lab work that was ordered in the spring before her visit in October

## 2018-06-10 ENCOUNTER — Other Ambulatory Visit: Payer: Self-pay | Admitting: Family Medicine

## 2018-06-10 NOTE — Telephone Encounter (Signed)
Pt returned call. Pt informed to stop Januvia if she cant afford it. Pt informed to check sugars periodically in mornings and evenings. Pt states she has no way to check her sugar. Pt states the only time her sugar gets checked is when she has lab work done. Informed patient to continue Metformin and complete lab work before office visit. Pt verbalized understanding.

## 2018-06-10 NOTE — Telephone Encounter (Signed)
Depending on what the A1c shows we may need to add an additional medicine.

## 2018-06-10 NOTE — Telephone Encounter (Signed)
I called and left a message with pt husband. I asked that he please have pt to r/c.

## 2018-06-17 ENCOUNTER — Other Ambulatory Visit: Payer: Self-pay | Admitting: Family Medicine

## 2018-06-24 ENCOUNTER — Telehealth: Payer: Self-pay | Admitting: Family Medicine

## 2018-06-24 DIAGNOSIS — E039 Hypothyroidism, unspecified: Secondary | ICD-10-CM

## 2018-06-24 DIAGNOSIS — E119 Type 2 diabetes mellitus without complications: Secondary | ICD-10-CM

## 2018-06-24 DIAGNOSIS — E7849 Other hyperlipidemia: Secondary | ICD-10-CM

## 2018-06-24 DIAGNOSIS — Z79899 Other long term (current) drug therapy: Secondary | ICD-10-CM

## 2018-06-24 NOTE — Telephone Encounter (Signed)
Had labs drawn 12/24/2017 cbc,tsh,hepatic panel,lipid,bmet,a1c. Please advise,same?

## 2018-06-24 NOTE — Telephone Encounter (Signed)
Patient scheduled on 08/08/18 for a 6 month follow up w/ Dr. Nicki Reaper, Pt husband is requesting if any blood work is needing to be done to go ahead and send order to lab and advise when ready to go and get this completed.

## 2018-06-25 ENCOUNTER — Other Ambulatory Visit: Payer: Self-pay | Admitting: Family Medicine

## 2018-06-25 ENCOUNTER — Other Ambulatory Visit: Payer: Self-pay | Admitting: Obstetrics and Gynecology

## 2018-06-25 NOTE — Telephone Encounter (Signed)
6 refills on each

## 2018-06-25 NOTE — Telephone Encounter (Signed)
aghhh as per previous 6 refills each

## 2018-06-26 ENCOUNTER — Ambulatory Visit: Payer: Medicare HMO | Admitting: Gastroenterology

## 2018-06-26 NOTE — Telephone Encounter (Signed)
I recommend TSH, metabolic 7, liver, lipid, A1c, urine ACR

## 2018-06-26 NOTE — Telephone Encounter (Signed)
Orders put in. Pt notified.

## 2018-06-27 LAB — HM DIABETES EYE EXAM

## 2018-07-12 DIAGNOSIS — Z79899 Other long term (current) drug therapy: Secondary | ICD-10-CM | POA: Diagnosis not present

## 2018-07-12 DIAGNOSIS — E7849 Other hyperlipidemia: Secondary | ICD-10-CM | POA: Diagnosis not present

## 2018-07-12 DIAGNOSIS — E039 Hypothyroidism, unspecified: Secondary | ICD-10-CM | POA: Diagnosis not present

## 2018-07-12 DIAGNOSIS — E119 Type 2 diabetes mellitus without complications: Secondary | ICD-10-CM | POA: Diagnosis not present

## 2018-07-14 LAB — BASIC METABOLIC PANEL
BUN/Creatinine Ratio: 19 (ref 12–28)
BUN: 17 mg/dL (ref 8–27)
CALCIUM: 9.3 mg/dL (ref 8.7–10.3)
CO2: 25 mmol/L (ref 20–29)
CREATININE: 0.91 mg/dL (ref 0.57–1.00)
Chloride: 101 mmol/L (ref 96–106)
GFR calc Af Amer: 75 mL/min/{1.73_m2} (ref >59)
GFR calc non Af Amer: 65 mL/min/{1.73_m2} (ref >59)
GLUCOSE: 137 mg/dL — AB (ref 65–99)
Potassium: 4.4 mmol/L (ref 3.5–5.2)
SODIUM: 139 mmol/L (ref 134–144)

## 2018-07-14 LAB — HEPATIC FUNCTION PANEL
ALBUMIN: 4.3 g/dL (ref 3.6–4.8)
ALT: 44 IU/L — ABNORMAL HIGH (ref 0–32)
AST: 35 IU/L (ref 0–40)
Alkaline Phosphatase: 68 IU/L (ref 39–117)
Bilirubin Total: 0.4 mg/dL (ref 0.0–1.2)
Bilirubin, Direct: 0.09 mg/dL (ref 0.00–0.40)
TOTAL PROTEIN: 7.4 g/dL (ref 6.0–8.5)

## 2018-07-14 LAB — MICROALBUMIN / CREATININE URINE RATIO: CREATININE, UR: 17.4 mg/dL

## 2018-07-14 LAB — LIPID PANEL
Chol/HDL Ratio: 4.6 ratio — ABNORMAL HIGH (ref 0.0–4.4)
Cholesterol, Total: 193 mg/dL (ref 100–199)
HDL: 42 mg/dL (ref >39)
LDL Calculated: 125 mg/dL — ABNORMAL HIGH (ref 0–99)
Triglycerides: 131 mg/dL (ref 0–149)
VLDL CHOLESTEROL CAL: 26 mg/dL (ref 5–40)

## 2018-07-14 LAB — HEMOGLOBIN A1C
Est. average glucose Bld gHb Est-mCnc: 160 mg/dL
HEMOGLOBIN A1C: 7.2 % — AB (ref 4.8–5.6)

## 2018-07-14 LAB — TSH: TSH: 2.68 u[IU]/mL (ref 0.450–4.500)

## 2018-07-15 ENCOUNTER — Encounter: Payer: Self-pay | Admitting: Family Medicine

## 2018-07-17 ENCOUNTER — Other Ambulatory Visit: Payer: Self-pay | Admitting: Family Medicine

## 2018-07-18 ENCOUNTER — Ambulatory Visit: Payer: Medicare HMO | Admitting: Family Medicine

## 2018-07-18 ENCOUNTER — Ambulatory Visit: Payer: Medicare HMO | Admitting: Gastroenterology

## 2018-07-18 ENCOUNTER — Encounter: Payer: Self-pay | Admitting: Gastroenterology

## 2018-07-18 VITALS — BP 160/74 | HR 61 | Temp 97.6°F | Ht 60.0 in | Wt 130.8 lb

## 2018-07-18 DIAGNOSIS — R945 Abnormal results of liver function studies: Secondary | ICD-10-CM

## 2018-07-18 DIAGNOSIS — K760 Fatty (change of) liver, not elsewhere classified: Secondary | ICD-10-CM

## 2018-07-18 DIAGNOSIS — R7989 Other specified abnormal findings of blood chemistry: Secondary | ICD-10-CM

## 2018-07-18 NOTE — Assessment & Plan Note (Signed)
Chronically mildly elevated transaminases, known fatty liver with no obvious stigmata of advanced liver disease on labs.  Elastography in 2017 with mildly nodular hepatic border, F2/F3 scores.  Subsequent ultrasound 1 year ago without overt cirrhosis.  We have offered her repeat ultrasound but at this time she wants to postpone until after her follow-up appointment in 6 months.  As her transaminases are improved and she has had extensive serologies in the past we will continue to monitor for now.  Repeat LFTs in 6 months, this is been done through her PCP.  She will return to the office in 6 months and at that time we will schedule ultrasound of the liver.  She will call in the interim with any questions or concerns.

## 2018-07-18 NOTE — Progress Notes (Addendum)
Primary Care Physician: Kathyrn Drown, MD  Primary Gastroenterologist:  Garfield Cornea, MD   Chief Complaint  Patient presents with  . nash    f/u. Doing okay    HPI: Nicole Lam is a 67 y.o. female here for f/u [name].  Last seen in March.  She has a history of mildly elevated transaminases, fatty liver on ultrasound but no outright cirrhosis, last imaging 1 year ago.  Back in 2017 she had ultrasound with elastography, Metavir score F2/F3, hepatic contour.  Labs last week with AST of 35 first time and it has been normal in the past 2 years.  ALT down to 44.  Other LFTs normal.  Platelet count last year was 308,000.  Recent A1c of 7.2 up from 6.4.  Clinically she feels well.  No abdominal pain.  Appetite is good.  She is working approximately 50 hours a week right now.  She wants to put off ultrasound until next appointment as she feels like her workload would be less demanding at that point.  Denies any upper GI symptoms.  Occasional constipation well managed on MiraLAX.   Current Outpatient Medications  Medication Sig Dispense Refill  . acyclovir (ZOVIRAX) 400 MG tablet TAKE ONE TABLET BY MOUTH 5 TIMES DAILY 75 tablet 2  . ALPRAZolam (XANAX) 0.25 MG tablet Take 1 tablet (0.25 mg total) by mouth 2 (two) times daily as needed for anxiety or sleep. (Patient taking differently: Take 0.25 mg by mouth as needed for anxiety or sleep. ) 30 tablet 5  . aspirin 81 MG tablet Take 81 mg by mouth at bedtime.     . diclofenac sodium (VOLTAREN) 1 % GEL Apply 4 g topically 4 (four) times daily. (Patient taking differently: Apply 4 g topically as needed. ) 5 Tube 5  . diphenhydrAMINE (BENADRYL) 25 mg capsule Take 25 mg by mouth at bedtime.    Marland Kitchen estradiol (ESTRACE) 0.5 MG tablet TAKE 1 TABLET EVERY DAY 90 tablet 1  . Glucosamine-Chondroitin (GLUCOSAMINE CHONDR COMPLEX PO) Take by mouth. Chews 3 by mouth daily ( pt unsure of dose)    . HYDROcodone-acetaminophen (NORCO) 7.5-325 MG tablet 1 q4  hours prn 24 tablet 0  . losartan (COZAAR) 50 MG tablet TAKE 1 TABLET EVERY DAY 90 tablet 1  . meloxicam (MOBIC) 15 MG tablet TAKE 1 TABLET EVERY DAY 90 tablet 1  . metFORMIN (GLUCOPHAGE) 500 MG tablet TAKE 2 TABLETS IN THE MORNING  AND TAKE 2 TABLETS IN THE EVENING WITH MEALS 360 tablet 0  . naproxen sodium (ANAPROX) 220 MG tablet Take 440 mg by mouth once as needed (for pain).    Marland Kitchen OVER THE COUNTER MEDICATION Joint Juice by mouth daily    . pantoprazole (PROTONIX) 40 MG tablet TAKE 1 TABLET EVERY DAY 90 tablet 1  . phenylephrine-shark liver oil-mineral oil-petrolatum (PREPARATION H) 0.25-3-14-71.9 % rectal ointment Place 1 application rectally 2 (two) times daily as needed for hemorrhoids.    . polyethylene glycol (MIRALAX / GLYCOLAX) packet Take 17 g by mouth daily as needed.     . pravastatin (PRAVACHOL) 40 MG tablet TAKE 1 TABLET EVERY DAY (APPOINTMENT IS NEEDED) 90 tablet 1  . pseudoephedrine (SUDAFED) 30 MG tablet Take 30 mg by mouth every 4 (four) hours as needed for congestion.    . VOLTAREN 1 % GEL APPLY 4 GRAMS FOUR TIMES DAILY (Patient taking differently: APPLY 4 GRAMS FOUR TIMES DAILY. Takes as needed) 5 Tube 5  . levothyroxine (SYNTHROID,  LEVOTHROID) 88 MCG tablet TAKE 1 TABLET BY MOUTH ONCE DAILY 90 tablet 1   No current facility-administered medications for this visit.     Allergies as of 07/18/2018  . (No Known Allergies)    ROS:  General: Negative for anorexia, weight loss, fever, chills, fatigue, weakness. ENT: Negative for hoarseness, difficulty swallowing , nasal congestion. CV: Negative for chest pain, angina, palpitations, dyspnea on exertion, peripheral edema.  Respiratory: Negative for dyspnea at rest, dyspnea on exertion, cough, sputum, wheezing.  GI: See history of present illness. GU:  Negative for dysuria, hematuria, urinary incontinence, urinary frequency, nocturnal urination.  Endo: Negative for unusual weight change.    Physical Examination:   BP (!)  160/74 (BP Location: Left Arm, Cuff Size: Normal)   Pulse 61   Temp 97.6 F (36.4 C) (Oral)   Ht 5' (1.524 m)   Wt 130 lb 12.8 oz (59.3 kg)   BMI 25.55 kg/m   General: Well-nourished, well-developed in no acute distress.  Eyes: No icterus. Mouth: Oropharyngeal mucosa moist and pink , no lesions erythema or exudate. Lungs: Clear to auscultation bilaterally.  Heart: Regular rate and rhythm, no murmurs rubs or gallops.  Abdomen: Bowel sounds are normal, nontender, nondistended, no hepatosplenomegaly or masses, no abdominal bruits or hernia , no rebound or guarding.   Extremities: No lower extremity edema. No clubbing or deformities. Neuro: Alert and oriented x 4   Skin: Warm and dry, no jaundice.   Psych: Alert and cooperative, normal mood and affect.  Labs:  Lab Results  Component Value Date   WBC 4.6 08/08/2017   HGB 11.6 08/08/2017   HCT 36.3 08/08/2017   MCV 80 08/08/2017   PLT 308 08/08/2017   Lab Results  Component Value Date   CREATININE 0.91 07/12/2018   BUN 17 07/12/2018   NA 139 07/12/2018   K 4.4 07/12/2018   CL 101 07/12/2018   CO2 25 07/12/2018   Lab Results  Component Value Date   ALT 44 (H) 07/12/2018   AST 35 07/12/2018   ALKPHOS 68 07/12/2018   BILITOT 0.4 07/12/2018   Lab Results  Component Value Date   HGBA1C 7.2 (H) 07/12/2018    Imaging Studies: No results found.

## 2018-07-18 NOTE — Progress Notes (Signed)
CC'ED TO PCP 

## 2018-07-18 NOTE — Patient Instructions (Signed)
We will see you back in six months. Would consider ultrasound of your liver at that time.    Fatty Liver Fatty liver, also called hepatic steatosis or steatohepatitis, is a condition in which too much fat has built up in your liver cells. The liver removes harmful substances from your bloodstream. It produces fluids your body needs. It also helps your body use and store energy from the food you eat. In many cases, fatty liver does not cause symptoms or problems. It is often diagnosed when tests are being done for other reasons. However, over time, fatty liver can cause inflammation that may lead to more serious liver problems, such as scarring of the liver (cirrhosis). What are the causes? Causes of fatty liver may include:  Drinking too much alcohol.  Poor nutrition.  Obesity.  Cushing syndrome.  Diabetes.  Hyperlipidemia.  Pregnancy.  Certain drugs.  Poisons.  Some viral infections.  What increases the risk? You may be more likely to develop fatty liver if you:  Abuse alcohol.  Are pregnant.  Are overweight.  Have diabetes.  Have hepatitis.  Have a high triglyceride level.  What are the signs or symptoms? Fatty liver often does not cause any symptoms. In cases where symptoms develop, they can include:  Fatigue.  Weakness.  Weight loss.  Confusion.  Abdominal pain.  Yellowing of your skin and the white parts of your eyes (jaundice).  Nausea and vomiting.  How is this diagnosed? Fatty liver may be diagnosed by:  Physical exam and medical history.  Blood tests.  Imaging tests, such as an ultrasound, CT scan, or MRI.  Liver biopsy. A small sample of liver tissue is removed using a needle. The sample is then looked at under a microscope.  How is this treated? Fatty liver is often caused by other health conditions. Treatment for fatty liver may involve medicines and lifestyle changes to manage conditions such as:  Alcoholism.  High  cholesterol.  Diabetes.  Being overweight or obese.  Follow these instructions at home:  Eat a healthy diet as directed by your health care provider.  Exercise regularly. This can help you lose weight and control your cholesterol and diabetes. Talk to your health care provider about an exercise plan and which activities are best for you.  Do not drink alcohol.  Take medicines only as directed by your health care provider. Contact a health care provider if: You have difficulty controlling your:  Blood sugar.  Cholesterol.  Alcohol consumption.  Get help right away if:  You have abdominal pain.  You have jaundice.  You have nausea and vomiting. This information is not intended to replace advice given to you by your health care provider. Make sure you discuss any questions you have with your health care provider. Document Released: 11/16/2005 Document Revised: 03/08/2016 Document Reviewed: 02/10/2014 Elsevier Interactive Patient Education  [employer].

## 2018-08-08 ENCOUNTER — Encounter: Payer: Self-pay | Admitting: Family Medicine

## 2018-08-08 ENCOUNTER — Ambulatory Visit (INDEPENDENT_AMBULATORY_CARE_PROVIDER_SITE_OTHER): Payer: Medicare HMO | Admitting: Family Medicine

## 2018-08-08 VITALS — BP 130/78 | Ht 60.0 in | Wt 131.2 lb

## 2018-08-08 DIAGNOSIS — E039 Hypothyroidism, unspecified: Secondary | ICD-10-CM

## 2018-08-08 DIAGNOSIS — Z23 Encounter for immunization: Secondary | ICD-10-CM | POA: Diagnosis not present

## 2018-08-08 DIAGNOSIS — E119 Type 2 diabetes mellitus without complications: Secondary | ICD-10-CM

## 2018-08-08 DIAGNOSIS — E7849 Other hyperlipidemia: Secondary | ICD-10-CM | POA: Diagnosis not present

## 2018-08-08 DIAGNOSIS — G8929 Other chronic pain: Secondary | ICD-10-CM | POA: Diagnosis not present

## 2018-08-08 DIAGNOSIS — M5442 Lumbago with sciatica, left side: Secondary | ICD-10-CM | POA: Diagnosis not present

## 2018-08-08 MED ORDER — ATORVASTATIN CALCIUM 20 MG PO TABS: 20.0000 mg | ORAL_TABLET | Freq: Every day | ORAL | 0 refills | Status: DC

## 2018-08-08 MED ORDER — HYDROCODONE-ACETAMINOPHEN 7.5-325 MG PO TABS: ORAL_TABLET | ORAL | 0 refills | Status: DC

## 2018-08-08 NOTE — Progress Notes (Signed)
Subjective:    Patient ID: Nicole Lam, female    DOB: 1951/05/04, 67 y.o.   MRN: [id number]  Diabetes  She presents for her follow-up diabetic visit. She has type 2 diabetes mellitus. Pertinent negatives for hypoglycemia include no confusion or dizziness. Pertinent negatives for diabetes include no chest pain, no fatigue, no polydipsia, no polyphagia and no weakness. Risk factors for coronary artery disease include diabetes mellitus and post-menopausal. Current diabetic treatment includes oral agent (monotherapy). She is compliant with treatment all of the time. Her weight is stable. She is following a diabetic diet. She has not had a previous visit with a dietitian. She does not see a podiatrist.Eye exam is current.   Patient would like Flu shot today and to discuss recent labs  Patient for blood pressure check up.  The patient does have hypertension.  The patient is on medication.  Patient relates compliance with meds. Todays BP reviewed with the patient. Patient denies issues with medication. Patient relates reasonable diet. Patient tries to minimize salt. Patient aware of BP goals.  Patient here for follow-up regarding cholesterol.  The patient does have hyperlipidemia.  Patient does try to maintain a reasonable diet.  Patient does take the medication on a regular basis.  Denies missing a dose.  The patient denies any obvious side effects.  Prior blood work results reviewed with the patient.  The patient is aware of his cholesterol goals and the need to keep it under good control to lessen the risk of disease.  Chronic intermittent low back pain uses hydrocodone sparingly does not abuse of medicine drug registry was checked Review of Systems  Constitutional: Negative for activity change, appetite change and fatigue.  HENT: Negative for congestion and rhinorrhea.   Respiratory: Negative for cough and shortness of breath.   Cardiovascular: Negative for chest pain and leg swelling.    Gastrointestinal: Negative for abdominal pain and diarrhea.  Endocrine: Negative for polydipsia and polyphagia.  Skin: Negative for color change.  Neurological: Negative for dizziness and weakness.  Psychiatric/Behavioral: Negative for behavioral problems and confusion.       Objective:   Physical Exam  Constitutional: She appears well-nourished. No distress.  HENT:  Head: Normocephalic and atraumatic.  Eyes: Right eye exhibits no discharge. Left eye exhibits no discharge.  Neck: No tracheal deviation present.  Cardiovascular: Normal rate, regular rhythm and normal heart sounds.  No murmur heard. Pulmonary/Chest: Effort normal and breath sounds normal. No respiratory distress.  Musculoskeletal: She exhibits no edema.  Lymphadenopathy:    She has no cervical adenopathy.  Neurological: She is alert. Coordination normal.  Skin: Skin is warm and dry.  Psychiatric: She has a normal mood and affect. Her behavior is normal.  Vitals reviewed.  Results for orders placed or performed in visit on 08/08/18  HM DIABETES EYE EXAM  Result Value Ref Range   HM Diabetic Eye Exam No Retinopathy No Retinopathy    Lab work reviewed with patient in detail      Assessment & Plan:  Check A1c and lipid in 3 months to make sure this is getting better Follow-up in 6 months  Subpar control of diabetes better diet patient does not want to be on other medicines because her too expensive may need to add glipizide if A1c does not improve by the next time  Hyperlipidemia subpar control we talked about other options she will try the Lipitor and she will let us know how that goes we will stop the pravastatin  she will do her labs in January  Chronic low back pain uses hydrocodone intermittently but does not use it frequently prescription was sent in patient does take medication responsibly  Hypothyroidism overall does a good job taking her medicine lab work looks good  25 minutes was spent with the  patient.  This statement verifies that 25 minutes was indeed spent with the patient.  More than 50% of this visit-total duration of the visit-was spent in counseling and coordination of care. The issues that the patient came in for today as reflected in the diagnosis (s) please refer to documentation for further details.

## 2018-08-08 NOTE — Patient Instructions (Signed)
Results for orders placed or performed in visit on 06/24/18  Lipid panel  Result Value Ref Range   Cholesterol, Total 193 100 - 199 mg/dL   Triglycerides 131 0 - 149 mg/dL   HDL 42 >39 mg/dL   VLDL Cholesterol Cal 26 5 - 40 mg/dL   LDL Calculated 125 (H) 0 - 99 mg/dL   Chol/HDL Ratio 4.6 (H) 0.0 - 4.4 ratio  Hepatic function panel  Result Value Ref Range   Total Protein 7.4 6.0 - 8.5 g/dL   Albumin 4.3 3.6 - 4.8 g/dL   Bilirubin Total 0.4 0.0 - 1.2 mg/dL   Bilirubin, Direct 0.09 0.00 - 0.40 mg/dL   Alkaline Phosphatase 68 39 - 117 IU/L   AST 35 0 - 40 IU/L   ALT 44 (H) 0 - 32 IU/L  TSH  Result Value Ref Range   TSH 2.680 0.450 - 4.500 uIU/mL  Basic metabolic panel  Result Value Ref Range   Glucose 137 (H) 65 - 99 mg/dL   BUN 17 8 - 27 mg/dL   Creatinine, Ser 0.91 0.57 - 1.00 mg/dL   GFR calc non Af Amer 65 >59 mL/min/1.73   GFR calc Af Amer 75 >59 mL/min/1.73   BUN/Creatinine Ratio 19 12 - 28   Sodium 139 134 - 144 mmol/L   Potassium 4.4 3.5 - 5.2 mmol/L   Chloride 101 96 - 106 mmol/L   CO2 25 20 - 29 mmol/L   Calcium 9.3 8.7 - 10.3 mg/dL  Hemoglobin A1c  Result Value Ref Range   Hgb A1c MFr Bld 7.2 (H) 4.8 - 5.6 %   Est. average glucose Bld gHb Est-mCnc 160 mg/dL  Microalbumin / creatinine urine ratio  Result Value Ref Range   Creatinine, Urine 17.4 Not Estab. mg/dL   Microalbumin, Urine <3.0 Not Estab. ug/mL   Microalb/Creat Ratio <17.2 0.0 - 30.0 mg/g creat    Diabetes Mellitus and Nutrition When you have diabetes (diabetes mellitus), it is very important to have healthy eating habits because your blood sugar (glucose) levels are greatly affected by what you eat and drink. Eating healthy foods in the appropriate amounts, at about the same times every day, can help you:  Control your blood glucose.  Lower your risk of heart disease.  Improve your blood pressure.  Reach or maintain a healthy weight.  Every person with diabetes is different, and each person has  different needs for a meal plan. Your health care provider may recommend that you work with a diet and nutrition specialist (dietitian) to make a meal plan that is best for you. Your meal plan may vary depending on factors such as:  The calories you need.  The medicines you take.  Your weight.  Your blood glucose, blood pressure, and cholesterol levels.  Your activity level.  Other health conditions you have, such as heart or kidney disease.  How do carbohydrates affect me? Carbohydrates affect your blood glucose level more than any other type of food. Eating carbohydrates naturally increases the amount of glucose in your blood. Carbohydrate counting is a method for keeping track of how many carbohydrates you eat. Counting carbohydrates is important to keep your blood glucose at a healthy level, especially if you use insulin or take certain oral diabetes medicines. It is important to know how many carbohydrates you can safely have in each meal. This is different for every person. Your dietitian can help you calculate how many carbohydrates you should have at each meal and  for snack. Foods that contain carbohydrates include:  Bread, cereal, rice, pasta, and crackers.  Potatoes and corn.  Peas, beans, and lentils.  Milk and yogurt.  Fruit and juice.  Desserts, such as cakes, cookies, ice cream, and candy.  How does alcohol affect me? Alcohol can cause a sudden decrease in blood glucose (hypoglycemia), especially if you use insulin or take certain oral diabetes medicines. Hypoglycemia can be a life-threatening condition. Symptoms of hypoglycemia (sleepiness, dizziness, and confusion) are similar to symptoms of having too much alcohol. If your health care provider says that alcohol is safe for you, follow these guidelines:  Limit alcohol intake to no more than 1 drink per day for nonpregnant women and 2 drinks per day for men. One drink equals 12 oz of beer, 5 oz of wine, or 1 oz of  hard liquor.  Do not drink on an empty stomach.  Keep yourself hydrated with water, diet soda, or unsweetened iced tea.  Keep in mind that regular soda, juice, and other mixers may contain a lot of sugar and must be counted as carbohydrates.  What are tips for following this plan? Reading food labels  Start by checking the serving size on the label. The amount of calories, carbohydrates, fats, and other nutrients listed on the label are based on one serving of the food. Many foods contain more than one serving per package.  Check the total grams (g) of carbohydrates in one serving. You can calculate the number of servings of carbohydrates in one serving by dividing the total carbohydrates by 15. For example, if a food has 30 g of total carbohydrates, it would be equal to 2 servings of carbohydrates.  Check the number of grams (g) of saturated and trans fats in one serving. Choose foods that have low or no amount of these fats.  Check the number of milligrams (mg) of sodium in one serving. Most people should limit total sodium intake to less than 2,300 mg per day.  Always check the nutrition information of foods labeled as "low-fat" or "nonfat". These foods may be higher in added sugar or refined carbohydrates and should be avoided.  Talk to your dietitian to identify your daily goals for nutrients listed on the label. Shopping  Avoid buying canned, premade, or processed foods. These foods tend to be high in fat, sodium, and added sugar.  Shop around the outside edge of the grocery store. This includes fresh fruits and vegetables, bulk grains, fresh meats, and fresh dairy. Cooking  Use low-heat cooking methods, such as baking, instead of high-heat cooking methods like deep frying.  Cook using healthy oils, such as olive, canola, or sunflower oil.  Avoid cooking with butter, cream, or high-fat meats. Meal planning  Eat meals and snacks regularly, preferably at the same times every  day. Avoid going long periods of time without eating.  Eat foods high in fiber, such as fresh fruits, vegetables, beans, and whole grains. Talk to your dietitian about how many servings of carbohydrates you can eat at each meal.  Eat 4-6 ounces of lean protein each day, such as lean meat, chicken, fish, eggs, or tofu. 1 ounce is equal to 1 ounce of meat, chicken, or fish, 1 egg, or 1/4 cup of tofu.  Eat some foods each day that contain healthy fats, such as avocado, nuts, seeds, and fish. Lifestyle   Check your blood glucose regularly.  Exercise at least 30 minutes 5 or more days each week, or as told by  your health care provider.  Take medicines as told by your health care provider.  Do not use any products that contain nicotine or tobacco, such as cigarettes and e-cigarettes. If you need help quitting, ask your health care provider.  Work with a Social worker or diabetes educator to identify strategies to manage stress and any emotional and social challenges. What are some questions to ask my health care provider?  Do I need to meet with a diabetes educator?  Do I need to meet with a dietitian?  What number can I call if I have questions?  When are the best times to check my blood glucose? Where to find more information:  American Diabetes Association: diabetes.org/food-and-fitness/food  Academy of Nutrition and Dietetics: PokerClues.dk  [employer] of Diabetes and Digestive and Kidney Diseases (NIH): ContactWire.be Summary  A healthy meal plan will help you control your blood glucose and maintain a healthy lifestyle.  Working with a diet and nutrition specialist (dietitian) can help you make a meal plan that is best for you.  Keep in mind that carbohydrates and alcohol have immediate effects on your blood glucose levels. It is important to count  carbohydrates and to use alcohol carefully. This information is not intended to replace advice given to you by your health care provider. Make sure you discuss any questions you have with your health care provider. Document Released: 06/28/2005 Document Revised: 11/05/2016 Document Reviewed: 11/05/2016 Elsevier Interactive Patient Education  [employer].

## 2018-08-23 ENCOUNTER — Other Ambulatory Visit: Payer: Self-pay | Admitting: Family Medicine

## 2018-08-25 ENCOUNTER — Other Ambulatory Visit: Payer: Self-pay | Admitting: *Deleted

## 2018-08-25 MED ORDER — METFORMIN HCL 500 MG PO TABS: ORAL_TABLET | ORAL | 0 refills | Status: DC

## 2018-11-07 ENCOUNTER — Ambulatory Visit (INDEPENDENT_AMBULATORY_CARE_PROVIDER_SITE_OTHER): Payer: Medicare HMO | Admitting: Family Medicine

## 2018-11-07 ENCOUNTER — Encounter: Payer: Self-pay | Admitting: Family Medicine

## 2018-11-07 ENCOUNTER — Other Ambulatory Visit: Payer: Self-pay | Admitting: Family Medicine

## 2018-11-07 VITALS — BP 152/86 | Temp 97.9°F | Ht 60.0 in | Wt 133.0 lb

## 2018-11-07 DIAGNOSIS — E7849 Other hyperlipidemia: Secondary | ICD-10-CM | POA: Diagnosis not present

## 2018-11-07 DIAGNOSIS — E119 Type 2 diabetes mellitus without complications: Secondary | ICD-10-CM | POA: Diagnosis not present

## 2018-11-07 DIAGNOSIS — J019 Acute sinusitis, unspecified: Secondary | ICD-10-CM | POA: Diagnosis not present

## 2018-11-07 MED ORDER — AMOXICILLIN-POT CLAVULANATE 875-125 MG PO TABS: ORAL_TABLET | ORAL | 0 refills | Status: DC

## 2018-11-07 NOTE — Progress Notes (Signed)
   Subjective:    Patient ID: Nicole Lam, female    DOB: Jul 05, 1951, 68 y.o.   MRN: [id number]  HPI  Patient is here today with complaints of a productive cough,runnu nose and a headache,left ear and neck pain, on going for the last week.   Left ear   Using sudafed and mucus dm    Took a sudafed last night and helped a bit    She states everyone at her work is sick. She has been taking Alka seltzer and sudafed.   Started Monday, coughing bad di erngy    Overnight symt are bere   No fever   Frontal headach e    Not in to the chest yet     Review of Systems No headache, no major weight loss or weight gain, no chest pain no back pain abdominal pain no change in bowel habits complete ROS otherwise negative     Objective:   Physical Exam Alert, mild malaise. Hydration good Vitals stable. frontal/ maxillary tenderness evident positive nasal congestion. pharynx normal neck supple  lungs clear/no crackles or wheezes. heart regular in rhythm        Assessment & Plan:  Impression rhinosinusitis likely post viral, discussed with patient. plan antibiotics prescribed. Questions answered. Symptomatic care discussed. warning signs discussed. WSL

## 2018-11-08 LAB — LIPID PANEL
Chol/HDL Ratio: 3.8 ratio (ref 0.0–4.4)
Cholesterol, Total: 165 mg/dL (ref 100–199)
HDL: 43 mg/dL (ref >39)
LDL Calculated: 91 mg/dL (ref 0–99)
Triglycerides: 154 mg/dL — ABNORMAL HIGH (ref 0–149)
VLDL Cholesterol Cal: 31 mg/dL (ref 5–40)

## 2018-11-08 LAB — HEMOGLOBIN A1C
Est. average glucose Bld gHb Est-mCnc: 174 mg/dL
Hgb A1c MFr Bld: 7.7 % — ABNORMAL HIGH (ref 4.8–5.6)

## 2018-11-10 MED ORDER — GLIPIZIDE ER 2.5 MG PO TB24: 2.5000 mg | ORAL_TABLET | Freq: Every day | ORAL | 4 refills | Status: DC

## 2018-11-10 MED ORDER — GLIPIZIDE 5 MG PO TABS: 2.5000 mg | ORAL_TABLET | Freq: Every day | ORAL | 4 refills | Status: DC

## 2018-11-10 NOTE — Addendum Note (Signed)
Addended by: Dairl Ponder on: 11/10/2018 04:45 PM   Modules accepted: Orders

## 2018-11-10 NOTE — Addendum Note (Signed)
Addended by: Dairl Ponder on: 11/10/2018 04:59 PM   Modules accepted: Orders

## 2018-11-18 ENCOUNTER — Other Ambulatory Visit: Payer: Self-pay | Admitting: Family Medicine

## 2019-01-16 ENCOUNTER — Other Ambulatory Visit: Payer: Self-pay

## 2019-01-16 ENCOUNTER — Ambulatory Visit (INDEPENDENT_AMBULATORY_CARE_PROVIDER_SITE_OTHER): Payer: Medicare HMO | Admitting: Family Medicine

## 2019-01-16 ENCOUNTER — Ambulatory Visit: Payer: Medicare HMO | Admitting: Gastroenterology

## 2019-01-16 DIAGNOSIS — J019 Acute sinusitis, unspecified: Secondary | ICD-10-CM | POA: Diagnosis not present

## 2019-01-16 MED ORDER — DOXYCYCLINE HYCLATE 100 MG PO TABS: 100.0000 mg | ORAL_TABLET | Freq: Two times a day (BID) | ORAL | 0 refills | Status: DC

## 2019-01-16 NOTE — Progress Notes (Signed)
   Subjective:    Patient ID: Nicole Lam, female    DOB: 07/01/1951, 68 y.o.   MRN: [id number]  Sinus Problem  This is a new problem. The current episode started in the past 7 days. Associated symptoms include congestion, coughing and headaches. Treatments tried: otc allergy and cold meds.  This patient had a lot of head congestion drainage coughing not feeling good denies high fevers denies wheezing states has a hard time breathing through her head is taking a bunch of over-the-counter medicines and allergy medicines helping some having a lot of discolored phlegm would like to have a prescription sent denies any flulike symptoms no fevers chills or sweats Virtual Visit via Video Note Unable to do video patient did not have smart phone Was able to do telephone visit I connected with Nicole Lam on 01/16/19 at 10:00 AM EDT by a video enabled telemedicine application and verified that I am speaking with the correct person using two identifiers.   I discussed the limitations of evaluation and management by telemedicine and the availability of in person appointments. The patient expressed understanding and agreed to proceed.  History of Present Illness:    Observations/Objective:   Assessment and Plan:   Follow Up Instructions:    I discussed the assessment and treatment plan with the patient. The patient was provided an opportunity to ask questions and all were answered. The patient agreed with the plan and demonstrated an understanding of the instructions.   The patient was advised to call back or seek an in-person evaluation if the symptoms worsen or if the condition fails to improve as anticipated.  I provided 15 minutes of non-face-to-face time during this encounter.       Review of Systems  HENT: Positive for congestion.   Respiratory: Positive for cough.   Neurological: Positive for headaches.       Objective:   Physical Exam        Assessment & Plan:   Based on patient's symptomatology the following Allergies may use over-the-counter measures as directed It would be fine to go ahead with antibiotics for sinuses doxycycline twice daily for 10 days follow-up if progressive troubles or worse

## 2019-01-26 ENCOUNTER — Other Ambulatory Visit: Payer: Self-pay | Admitting: Obstetrics and Gynecology

## 2019-01-30 ENCOUNTER — Other Ambulatory Visit: Payer: Self-pay | Admitting: Family Medicine

## 2019-02-06 ENCOUNTER — Ambulatory Visit (INDEPENDENT_AMBULATORY_CARE_PROVIDER_SITE_OTHER): Payer: Medicare HMO | Admitting: Family Medicine

## 2019-02-06 ENCOUNTER — Other Ambulatory Visit: Payer: Self-pay

## 2019-02-06 DIAGNOSIS — E119 Type 2 diabetes mellitus without complications: Secondary | ICD-10-CM

## 2019-02-06 DIAGNOSIS — E039 Hypothyroidism, unspecified: Secondary | ICD-10-CM

## 2019-02-06 DIAGNOSIS — K760 Fatty (change of) liver, not elsewhere classified: Secondary | ICD-10-CM

## 2019-02-06 DIAGNOSIS — K219 Gastro-esophageal reflux disease without esophagitis: Secondary | ICD-10-CM

## 2019-02-06 DIAGNOSIS — E7849 Other hyperlipidemia: Secondary | ICD-10-CM | POA: Diagnosis not present

## 2019-02-06 MED ORDER — GLIPIZIDE ER 2.5 MG PO TB24: 2.5000 mg | ORAL_TABLET | Freq: Every day | ORAL | 1 refills | Status: DC

## 2019-02-06 MED ORDER — ATORVASTATIN CALCIUM 20 MG PO TABS: 20.0000 mg | ORAL_TABLET | Freq: Every day | ORAL | 1 refills | Status: DC

## 2019-02-06 MED ORDER — METFORMIN HCL 500 MG PO TABS: ORAL_TABLET | ORAL | 1 refills | Status: DC

## 2019-02-06 MED ORDER — HYDROCODONE-ACETAMINOPHEN 7.5-325 MG PO TABS: ORAL_TABLET | ORAL | 0 refills | Status: DC

## 2019-02-06 MED ORDER — LEVOTHYROXINE SODIUM 88 MCG PO TABS: 88.0000 ug | ORAL_TABLET | Freq: Every day | ORAL | 1 refills | Status: DC

## 2019-02-06 MED ORDER — PANTOPRAZOLE SODIUM 40 MG PO TBEC: 40.0000 mg | DELAYED_RELEASE_TABLET | Freq: Every day | ORAL | 1 refills | Status: DC

## 2019-02-06 NOTE — Progress Notes (Signed)
Subjective:    Patient ID: Nicole Lam, female    DOB: 04-09-1951, 68 y.o.   MRN: [id number] Telephone visit patient present at home I was present at the office coronavirus outbreak patient consented  Diabetes  She presents for her follow-up diabetic visit. She has type 2 diabetes mellitus. There are no hypoglycemic associated symptoms. There are no diabetic associated symptoms. There are no hypoglycemic complications. There are no diabetic complications. She does not see a podiatrist.Eye exam is not current.  pt states she has not heard back from lab work done in January.  I did point out to her that we did call her back in January she just forgot she states therefore we went over it again She does have reflux related issues Patient does have ongoing trouble with reflux.  Takes medication on a regular basis.  Tries to minimize foods as best they can.  They understand the importance of dietary compliance.  May also try to avoid eating a large meal close to bedtime.  Patient denies any dysphagia denies hematochezia.  States medicine does a good job keeping the problem under good control.  Without the medication may certainly have issues.They desire to continue taking their medication. Patient has thyroid condition.  Takes thyroid medication on a regular basis.  States that the proper way.  Relates compliance.  States no negative side effects.  States condition seems to be under good control.  Patient for blood pressure check up.  The patient does have hypertension.  The patient is on medication.  Patient relates compliance with meds. Todays BP reviewed with the patient. Patient denies issues with medication. Patient relates reasonable diet. Patient tries to minimize salt. Patient aware of BP goals.  Patient here for follow-up regarding cholesterol.  The patient does have hyperlipidemia.  Patient does try to maintain a reasonable diet.  Patient does take the medication on a regular basis.  Denies  missing a dose.  The patient denies any obvious side effects.  Prior blood work results reviewed with the patient.  The patient is aware of his cholesterol goals and the need to keep it under good control to lessen the risk of disease.  Virtual Visit via Telephone Note  I connected with Nicole Lam on 02/06/19 at  9:00 AM EDT by telephone and verified that I am speaking with the correct person using two identifiers.   I discussed the limitations, risks, security and privacy concerns of performing an evaluation and management service by telephone and the availability of in person appointments. I also discussed with the patient that there may be a patient responsible charge related to this service. The patient expressed understanding and agreed to proceed.   History of Present Illness:    Observations/Objective:   Assessment and Plan:   Follow Up Instructions:    I discussed the assessment and treatment plan with the patient. The patient was provided an opportunity to ask questions and all were answered. The patient agreed with the plan and demonstrated an understanding of the instructions.   The patient was advised to call back or seek an in-person evaluation if the symptoms worsen or if the condition fails to improve as anticipated.  I provided 25 minutes of non-face-to-face time during this encounter.   Vicente Males, LPN   Review of Systems     Objective:   Physical Exam  25 minutes spent with patient 25 minutes was spent with the patient.  This statement verifies that 25 minutes  was indeed spent with the patient.  More than 50% of this visit-total duration of the visit-was spent in counseling and coordination of care. The issues that the patient came in for today as reflected in the diagnosis (s) please refer to documentation for further details.       Assessment & Plan:  1. Fatty liver Patient does have fatty liver she is trying to the best can try to lose weight  and watch her diet she is trying to stay active she works 40 hours a week at work  2. Gastroesophageal reflux disease without esophagitis States she does take her medication on a regular basis it does do a good job keeping her symptoms under decent control  3. Hypothyroidism, unspecified type She takes her thyroid medicine early in the morning and denies any problems with it.  4. Type 2 diabetes mellitus without complication, without long-term current use of insulin (HCC) Previous A1c reviewed patient states she is keeping sugars under decent control watching diet closely  5. Other hyperlipidemia Previous labs reviewed new labs ordered Do lab work later in the summer follow-up in September continue medication to keep cholesterol down  Chronic low back pain and hip pain uses hydrocodone sparingly get additional prescriptions were sent in

## 2019-02-14 ENCOUNTER — Other Ambulatory Visit: Payer: Self-pay | Admitting: Family Medicine

## 2019-02-16 ENCOUNTER — Other Ambulatory Visit: Payer: Self-pay | Admitting: *Deleted

## 2019-02-16 ENCOUNTER — Telehealth: Payer: Self-pay | Admitting: Family Medicine

## 2019-02-16 MED ORDER — LOSARTAN POTASSIUM 50 MG PO TABS: 50.0000 mg | ORAL_TABLET | Freq: Every day | ORAL | 0 refills | Status: DC

## 2019-02-16 NOTE — Telephone Encounter (Signed)
Please make sure 90-day with 1 refill was sent into mail order  She may have a 7-day supply sent to [facility] as requested

## 2019-02-16 NOTE — Telephone Encounter (Signed)
Patient states her losartan 50 mg wasn't called in at her last visit and she has been out for four days. We sent in prescription to [facility] today and it will be Thursday or Friday before she will get it . She wants to know if she can go that long with out her medication.Can you send in for a week of pills to [facility] until her medication comes in.Please advise.

## 2019-02-16 NOTE — Telephone Encounter (Signed)
Dr Nicki Reaper sent in 90 day supply and I sent in 7 day supply to [facility] in [place]. Pt notified.

## 2019-02-26 ENCOUNTER — Telehealth: Payer: Self-pay | Admitting: Family Medicine

## 2019-02-26 NOTE — Telephone Encounter (Signed)
If she is aymptomatic, dvise pt that right now I have zero idea where we can send her for this, screening sites in region are only screening high risk patients, or patients who are in need of hospitalziation, and she does not fit either criteria. Despite what we are hearing from [facility] d c unless her workplace has created a source for such testing I am not aware of any

## 2019-02-26 NOTE — Telephone Encounter (Signed)
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 [phone]

## 2019-02-26 NOTE — Telephone Encounter (Signed)
Patient advised per Dr Richardson Landry: If patient is aymptomatic, right now Dr Richardson Landry have zero idea where we can send her for this, screening sites in region are only screening high risk patients, or patients who are in need of hospitalziation, and she does not fit either criteria. Despite what we are hearing from [facility] d c unless her workplace has created a source for such testing Dr Richardson Landry not aware of any. Patient verbalized understanding and will reach out to her work place

## 2019-04-03 ENCOUNTER — Ambulatory Visit: Payer: Medicare HMO | Admitting: Gastroenterology

## 2019-04-09 NOTE — Progress Notes (Signed)
Referring Provider: Kathyrn Drown, MD Primary Care Physician:  Kathyrn Drown, MD Primary GI: Dr. Gala Romney  Chief Complaint  Patient presents with  . Follow-up    Fatty liver    HPI:   Nicole Lam is a 68 y.o. female presenting today with a history of [name] with mildly elevated LFTs and Metavir score F2/F3 in 2017.   Last seen in our office on 07/18/2018 for the same. She was doing well at that time. AST was normal for the first time in 2 years and ALT slightly elevated still at 44. A1c up to 7.2 from 6.4. No overt stigmata of advanced liver disease on labs. Plans to pursue [place] at next visit.   Last [place] on 07/04/17 with Echogenic liver parenchyma consistent with fatty infiltration diffusely. Question mild hepatomegaly. No gallstones.   Today she states she is doing well. She does have occasional constipation every 2-3 week for which she will take MiraLAX a few nights in a row and her bowels return to normal. Occasional tissue hematochezia if she has to strain when she waits too long to take her MiraLAX. Has had trouble with hemorrhoids in the past and used preparation H, but no itching, burning, rectal pain, or having to wipe a lot at this time. No melena.   Denies reflux symptoms. She continues to take Protonix and states she started taking this bc sometimes she felt like she would swallow air while eating and couldn't breathe. Since taking Protonix, she has not had this happen. Denies dysphagia, nausea, vomiting, abdominal pain, jaundice, skin rashes, itching, confusion, LE swelling, and abdominal swelling. Has gained a few pounds since last visit. States since covid they are limited to what they can do at work. Can't go in break room, so isn't able to eat as healthy as she could. Not much exercise. When she gets home, she sits down. Does minimal walking at work, otherwise, no exercise.    Feet hurt a lot. She is on her feet 10 hours a day.   Admits to feeling some dizziness if she  looks up then looks down.   Past Medical History:  Diagnosis Date  . Diabetes ([place])   . Fatty liver   . Hemorrhoid   . Hyperlipidemia   . Hypothyroidism   . Lipoma of neck     Past Surgical History:  Procedure Laterality Date  . ABDOMINAL HYSTERECTOMY    . APPENDECTOMY    . COLONOSCOPY  05/30/05   OMA:YOKHTX anal papilla, otherwise normal rectum.and colon  . COLONOSCOPY N/A 11/04/2015   RMR: normal colonoscopy  . DILATION AND CURETTAGE OF UTERUS      Current Outpatient Medications  Medication Sig Dispense Refill  . acyclovir (ZOVIRAX) 400 MG tablet TAKE ONE TABLET BY MOUTH 5 TIMES DAILY (Patient taking differently: as needed. ) 75 tablet 2  . aspirin 81 MG tablet Take 81 mg by mouth at bedtime.     Marland Kitchen atorvastatin (LIPITOR) 20 MG tablet Take 1 tablet (20 mg total) by mouth daily. 90 tablet 1  . diclofenac sodium (VOLTAREN) 1 % GEL Apply 4 g topically 4 (four) times daily. (Patient taking differently: Apply 4 g topically as needed. ) 5 Tube 5  . diphenhydrAMINE (BENADRYL) 25 mg capsule Take 25 mg by mouth as needed.     Marland Kitchen estradiol (ESTRACE) 0.5 MG tablet TAKE 1 TABLET EVERY DAY 90 tablet 1  . glipiZIDE (GLUCOTROL XL) 2.5 MG 24 hr tablet Take 1 tablet (2.5  mg total) by mouth daily with breakfast. 90 tablet 1  . Glucosamine-Chondroitin (GLUCOSAMINE CHONDR COMPLEX PO) Take by mouth. Chews 3 by mouth daily ( pt unsure of dose)    . HYDROcodone-acetaminophen (NORCO) 7.5-325 MG tablet 1 q4 hours prn (Patient taking differently: as needed. 1 q4 hours prn) 30 tablet 0  . levothyroxine (SYNTHROID) 88 MCG tablet Take 1 tablet (88 mcg total) by mouth daily. (Patient taking differently: Take 88 mcg by mouth daily. Mon, Wed, Fri -1/2 tablet, Tue, Thurs, Sat, Sun- 1 tablet) 90 tablet 1  . losartan (COZAAR) 50 MG tablet TAKE 1 TABLET EVERY DAY 90 tablet 1  . meloxicam (MOBIC) 15 MG tablet TAKE 1 TABLET EVERY DAY 90 tablet 1  . metFORMIN (GLUCOPHAGE) 500 MG tablet Take 2 qam and 2 in the evening  360 tablet 0  . naproxen sodium (ANAPROX) 220 MG tablet Take 440 mg by mouth once as needed (for pain).    . pantoprazole (PROTONIX) 40 MG tablet Take 1 tablet (40 mg total) by mouth daily. 90 tablet 1  . phenylephrine-shark liver oil-mineral oil-petrolatum (PREPARATION H) 0.25-3-14-71.9 % rectal ointment Place 1 application rectally 2 (two) times daily as needed for hemorrhoids.    . polyethylene glycol (MIRALAX / GLYCOLAX) packet Take 17 g by mouth daily as needed.     . pseudoephedrine (SUDAFED) 30 MG tablet Take 30 mg by mouth every 4 (four) hours as needed for congestion.    . VOLTAREN 1 % GEL APPLY 4 GRAMS FOUR TIMES DAILY (Patient taking differently: APPLY 4 GRAMS FOUR TIMES DAILY. Takes as needed) 5 Tube 5   No current facility-administered medications for this visit.     Allergies as of 04/10/2019  . (No Known Allergies)    Family History  Problem Relation Age of Onset  . Heart disease Mother   . Hypertension Mother   . Birth defects Daughter   . Colon cancer Neg Hx   . Liver disease Neg Hx     Social History   Socioeconomic History  . Marital status: Married    Spouse name: Not on file  . Number of children: Not on file  . Years of education: Not on file  . Highest education level: Not on file  Occupational History  . Not on file  Social Needs  . Financial resource strain: Not on file  . Food insecurity    Worry: Not on file    Inability: Not on file  . Transportation needs    Medical: Not on file    Non-medical: Not on file  Tobacco Use  . Smoking status: Never Smoker  . Smokeless tobacco: Never Used  Substance and Sexual Activity  . Alcohol use: No    Alcohol/week: 0.0 standard drinks  . Drug use: No  . Sexual activity: Yes    Birth control/protection: Surgical    Comment: hyst  Lifestyle  . Physical activity    Days per week: Not on file    Minutes per session: Not on file  . Stress: Not on file  Relationships  . Social Herbalist on  phone: Not on file    Gets together: Not on file    Attends religious service: Not on file    Active member of club or organization: Not on file    Attends meetings of clubs or organizations: Not on file    Relationship status: Not on file  Other Topics Concern  . Not on file  Social  History Narrative  . Not on file    Review of Systems: Gen: Denies fever, chills, fatigue, weakness.  CV: Denies chest pain, palpitations, syncope, and peripheral edema Resp: Denies dyspnea at rest, cough GI: See HPI Derm: Denies rash, itching, dry skin Psych: Denies depression, anxiety Heme: Denies bruising, bleeding  Physical Exam: BP (!) 159/72   Pulse 72   Temp 98.2 F (36.8 C)   Ht 4' 10"  (1.473 m)   Wt 135 lb 9.6 oz (61.5 kg)   BMI 28.34 kg/m  General:   Alert and oriented. No distress noted. Pleasant and cooperative.  Head:  Normocephalic and atraumatic. Eyes:  Conjuctiva clear without scleral icterus. Heart:  S1, S2 present without murmurs appreciated. Lungs:  Clear to auscultation bilaterally. No wheezes, rales, or rhonchi. No distress.  Abdomen:  +BS, soft, non-tender and non-distended. No rebound or guarding. No HSM or masses noted. Msk:  Symmetrical without gross deformities. Normal posture. Extremities:  Without edema. Skin: Without significant lesions or rashes, no jaundice.  Neurologic:  Alert and  oriented x4 Psych:  Alert and cooperative. Normal mood and affect.

## 2019-04-10 ENCOUNTER — Encounter: Payer: Self-pay | Admitting: Gastroenterology

## 2019-04-10 ENCOUNTER — Ambulatory Visit (INDEPENDENT_AMBULATORY_CARE_PROVIDER_SITE_OTHER): Payer: Medicare HMO | Admitting: Gastroenterology

## 2019-04-10 ENCOUNTER — Other Ambulatory Visit: Payer: Self-pay

## 2019-04-10 VITALS — BP 159/72 | HR 72 | Temp 98.2°F | Ht <= 58 in | Wt 135.6 lb

## 2019-04-10 DIAGNOSIS — E119 Type 2 diabetes mellitus without complications: Secondary | ICD-10-CM | POA: Diagnosis not present

## 2019-04-10 DIAGNOSIS — K760 Fatty (change of) liver, not elsewhere classified: Secondary | ICD-10-CM | POA: Diagnosis not present

## 2019-04-10 NOTE — Patient Instructions (Signed)
1. Please have your [place] completed at [facility]. We will call you with results and make further recommendations if needed.  2. Please complete your lab work that Dr. Wolfgang Phoenix has placed. We will follow up on this.   3. We will see you back in 6 months.   Please call if you have any concerns prior to your next visit.   Aliene Altes, PA-C [facility]

## 2019-04-10 NOTE — Assessment & Plan Note (Addendum)
68 y.o. female with history of fatty liver, suspected [name] as imaging has reported steatosis, and chronically mildly elevated transaminases. Electrography in 2017 with mildly nodular hepatic border, F2/F3 scores. Most recent US in September 2018 with fatty infiltration diffusely and mild hepatomegaly. Labs have not been completed since last visit, but at that time LFTs continued to improve with AST within normal limits and ALT of 44. No obvious signs/symptoms of advanced liver disease at this time. Patient has gained about 5 lbs since last visit. Most recent A1C slightly up, 7.7 in January 2020  from 7.16 June 2018.   Counseled patient on weight loss, healthy eating habits, exercising as much as possible, and the need for good control of diabetes and cholesterol as these are risk factors for fatty liver progressing.  Will need to have labs completed. They have already been placed by PCP. We will follow up on this when they come available.  Update ultrasound at this time. If any signs of advanced liver disease, would need to go ahead and update labs, get CBC, and consider pursuing EGD for evaluation of varices.   Follow-up in 6 months  Addendum: Reviewed labs completed on 07/17/19. LFTs continue to improve. AST normal 38, ALT elevated but improved at 39, alk phos normal 78, and total bilirubin normal 0.7,  A1C slightly improved to 7.5 from 7.7 in January. Will continue to follow LFTs along.

## 2019-04-13 ENCOUNTER — Telehealth: Payer: Self-pay | Admitting: *Deleted

## 2019-04-13 NOTE — Progress Notes (Signed)
CC'D TO PCP °

## 2019-04-13 NOTE — Telephone Encounter (Signed)
LMOVM  U/s scheduled for 7/1 at 8:30am, arrival time 8:15am, npo after midnight.

## 2019-04-13 NOTE — Telephone Encounter (Signed)
Patient received message with appt details

## 2019-04-15 ENCOUNTER — Other Ambulatory Visit: Payer: Self-pay

## 2019-04-15 ENCOUNTER — Ambulatory Visit (HOSPITAL_COMMUNITY): Payer: Medicare HMO

## 2019-04-15 ENCOUNTER — Ambulatory Visit (INDEPENDENT_AMBULATORY_CARE_PROVIDER_SITE_OTHER): Payer: Medicare HMO | Admitting: Family Medicine

## 2019-04-15 DIAGNOSIS — H6022 Malignant otitis externa, left ear: Secondary | ICD-10-CM | POA: Diagnosis not present

## 2019-04-15 MED ORDER — NEOMYCIN-POLYMYXIN-HC 3.5-10000-1 OT SOLN: 3.0000 [drp] | Freq: Four times a day (QID) | OTIC | 0 refills | Status: DC

## 2019-04-15 MED ORDER — CEFDINIR 300 MG PO CAPS: 300.0000 mg | ORAL_CAPSULE | Freq: Two times a day (BID) | ORAL | 0 refills | Status: DC

## 2019-04-15 NOTE — Progress Notes (Signed)
   Subjective:    Patient ID: Nicole Lam, female    DOB: 05-29-1951, 68 y.o.   MRN: [id number] [facility] audio only Otalgia  There is pain in the left ear. This is a new problem. The current episode started yesterday. Associated symptoms include a sore throat. She has tried ear drops for the symptoms.   Ear started hurting last night   Used [employer] ear drops  Has shooting pain   Review of Systems  HENT: Positive for ear pain and sore throat.    Virtual Visit via Video Note  I connected with DANISHA BRASSFIELD on 04/15/19 at  3:50 PM EDT by a video enabled telemedicine application and verified that I am speaking with the correct person using two identifiers.  Location: Patient: home Provider: office   I discussed the limitations of evaluation and management by telemedicine and the availability of in person appointments. The patient expressed understanding and agreed to proceed.  History of Present Illness:    Observations/Objective:   Assessment and Plan:   Follow Up Instructions:    I discussed the assessment and treatment plan with the patient. The patient was provided an opportunity to ask questions and all were answered. The patient agreed with the plan and demonstrated an understanding of the instructions.   The patient was advised to call back or seek an in-person evaluation if the symptoms worsen or if the condition fails to improve as anticipated.  I provided 18 minutes of non-face-to-face time during this encounter. Patient not swimming.  However she does have diabetes.  Remote history of external otitis.  Pain with movement of the ear.  Some radiation into the jaw.  Fairly severe at times.  No obvious swelling.  No fever no chills     Objective:   Physical Exam    Virtual    Assessment & Plan:  Impression probable external otitis.  Complicated by underlying diabetes.  Will prescribe antibiotic/steroid drops and oral antibiotics rationale discussed warning  signs discussed

## 2019-04-24 ENCOUNTER — Other Ambulatory Visit: Payer: Self-pay

## 2019-04-24 ENCOUNTER — Ambulatory Visit (HOSPITAL_COMMUNITY)
Admission: RE | Admit: 2019-04-24 | Discharge: 2019-04-24 | Disposition: A | Discharge status: Home / Self Care | Payer: Medicare HMO | Source: Ambulatory Visit | Attending: Gastroenterology | Admitting: Gastroenterology

## 2019-04-24 ENCOUNTER — Telehealth: Payer: Self-pay | Admitting: Gastroenterology

## 2019-04-24 DIAGNOSIS — K760 Fatty (change of) liver, not elsewhere classified: Secondary | ICD-10-CM | POA: Diagnosis not present

## 2019-04-24 NOTE — Telephone Encounter (Signed)
Nicole Lam, can you let patient know about her [place] results?  Ultrasound with evidence of hepatic steatosis which has been present in the past. No evidence of focal liver lesions or cirrhosis. Patient need to continue with efforts of weight loss, healthy eating, and keeping her diabetes and cholesterol well controlled. We will follow-up on her labs that have already been placed by her primary care as discussed at last visit.

## 2019-04-24 NOTE — Progress Notes (Signed)
Ultrasound with evidence of hepatic steatosis which has been present in the past. No evidence of focal liver lesions or cirrhosis. Patient need to continue with efforts of weight loss, healthy eating, and keeping her diabetes and cholesterol well controlled. We will follow-up on her labs that have already been placed by her primary care as discussed at visit.

## 2019-04-24 NOTE — Telephone Encounter (Signed)
Spoke with pt's spouse, pt will call back.

## 2019-04-27 NOTE — Telephone Encounter (Signed)
Pt notified of results

## 2019-06-06 ENCOUNTER — Other Ambulatory Visit: Payer: Self-pay | Admitting: Obstetrics and Gynecology

## 2019-06-16 ENCOUNTER — Other Ambulatory Visit (HOSPITAL_COMMUNITY): Payer: Self-pay | Admitting: Obstetrics and Gynecology

## 2019-06-16 DIAGNOSIS — Z1231 Encounter for screening mammogram for malignant neoplasm of breast: Secondary | ICD-10-CM

## 2019-06-17 ENCOUNTER — Other Ambulatory Visit: Payer: Self-pay | Admitting: Family Medicine

## 2019-06-19 ENCOUNTER — Other Ambulatory Visit: Payer: Self-pay

## 2019-06-19 ENCOUNTER — Ambulatory Visit (HOSPITAL_COMMUNITY)
Admission: RE | Admit: 2019-06-19 | Discharge: 2019-06-19 | Disposition: A | Discharge status: Home / Self Care | Payer: Medicare HMO | Source: Ambulatory Visit | Attending: Obstetrics and Gynecology | Admitting: Obstetrics and Gynecology

## 2019-06-19 DIAGNOSIS — Z1231 Encounter for screening mammogram for malignant neoplasm of breast: Secondary | ICD-10-CM | POA: Diagnosis not present

## 2019-06-23 ENCOUNTER — Telehealth: Payer: Self-pay | Admitting: *Deleted

## 2019-06-23 NOTE — Telephone Encounter (Signed)
LMOVM mammo normal and can repeat in 1 year.

## 2019-07-04 ENCOUNTER — Other Ambulatory Visit: Payer: Self-pay | Admitting: Family Medicine

## 2019-07-06 ENCOUNTER — Other Ambulatory Visit: Payer: Self-pay | Admitting: Obstetrics & Gynecology

## 2019-07-06 NOTE — Telephone Encounter (Signed)
Please schedule and then route back to nurses

## 2019-07-06 NOTE — Telephone Encounter (Signed)
Schedule follow-up for this fall May be virtual or in person patient can choose May have 1 refill

## 2019-07-07 NOTE — Telephone Encounter (Signed)
Patient has appointment for 10/9 for medication followup

## 2019-07-08 ENCOUNTER — Telehealth: Payer: Self-pay | Admitting: Family Medicine

## 2019-07-08 NOTE — Telephone Encounter (Signed)
Patient calling to check on losartan refill.  I told her it had been approved and should be sent later today.  She said she is almost out of meds.

## 2019-07-08 NOTE — Telephone Encounter (Signed)
Left detailed message on voicemail.  

## 2019-07-17 DIAGNOSIS — E039 Hypothyroidism, unspecified: Secondary | ICD-10-CM | POA: Diagnosis not present

## 2019-07-17 DIAGNOSIS — E119 Type 2 diabetes mellitus without complications: Secondary | ICD-10-CM | POA: Diagnosis not present

## 2019-07-17 DIAGNOSIS — K760 Fatty (change of) liver, not elsewhere classified: Secondary | ICD-10-CM | POA: Diagnosis not present

## 2019-07-17 DIAGNOSIS — E7849 Other hyperlipidemia: Secondary | ICD-10-CM | POA: Diagnosis not present

## 2019-07-18 LAB — BASIC METABOLIC PANEL
BUN/Creatinine Ratio: 16 (ref 12–28)
BUN: 15 mg/dL (ref 8–27)
CO2: 25 mmol/L (ref 20–29)
Calcium: 10 mg/dL (ref 8.7–10.3)
Chloride: 102 mmol/L (ref 96–106)
Creatinine, Ser: 0.93 mg/dL (ref 0.57–1.00)
GFR calc Af Amer: 73 mL/min/{1.73_m2} (ref >59)
GFR calc non Af Amer: 63 mL/min/{1.73_m2} (ref >59)
Glucose: 120 mg/dL — ABNORMAL HIGH (ref 65–99)
Potassium: 4.6 mmol/L (ref 3.5–5.2)
Sodium: 140 mmol/L (ref 134–144)

## 2019-07-18 LAB — MICROALBUMIN / CREATININE URINE RATIO
Creatinine, Urine: 23.4 mg/dL
Microalb/Creat Ratio: <13 mg/g creat (ref 0–29)
Microalbumin, Urine: <3 ug/mL

## 2019-07-18 LAB — HEMOGLOBIN A1C
Est. average glucose Bld gHb Est-mCnc: 169 mg/dL
Hgb A1c MFr Bld: 7.5 % — ABNORMAL HIGH (ref 4.8–5.6)

## 2019-07-18 LAB — LIPID PANEL
Chol/HDL Ratio: 3.9 ratio (ref 0.0–4.4)
Cholesterol, Total: 158 mg/dL (ref 100–199)
HDL: 41 mg/dL (ref >39)
LDL Chol Calc (NIH): 97 mg/dL (ref 0–99)
Triglycerides: 107 mg/dL (ref 0–149)
VLDL Cholesterol Cal: 20 mg/dL (ref 5–40)

## 2019-07-18 LAB — HEPATIC FUNCTION PANEL
ALT: 39 IU/L — ABNORMAL HIGH (ref 0–32)
AST: 38 IU/L (ref 0–40)
Albumin: 4.6 g/dL (ref 3.8–4.8)
Alkaline Phosphatase: 78 IU/L (ref 39–117)
Bilirubin Total: 0.7 mg/dL (ref 0.0–1.2)
Bilirubin, Direct: 0.15 mg/dL (ref 0.00–0.40)
Total Protein: 7.6 g/dL (ref 6.0–8.5)

## 2019-07-18 LAB — TSH: TSH: 5.14 u[IU]/mL — ABNORMAL HIGH (ref 0.450–4.500)

## 2019-07-24 ENCOUNTER — Ambulatory Visit (INDEPENDENT_AMBULATORY_CARE_PROVIDER_SITE_OTHER): Payer: Medicare HMO | Admitting: Family Medicine

## 2019-07-24 ENCOUNTER — Other Ambulatory Visit: Payer: Self-pay

## 2019-07-24 DIAGNOSIS — E7849 Other hyperlipidemia: Secondary | ICD-10-CM | POA: Diagnosis not present

## 2019-07-24 DIAGNOSIS — M25512 Pain in left shoulder: Secondary | ICD-10-CM

## 2019-07-24 DIAGNOSIS — M5442 Lumbago with sciatica, left side: Secondary | ICD-10-CM | POA: Diagnosis not present

## 2019-07-24 DIAGNOSIS — E039 Hypothyroidism, unspecified: Secondary | ICD-10-CM | POA: Diagnosis not present

## 2019-07-24 DIAGNOSIS — E119 Type 2 diabetes mellitus without complications: Secondary | ICD-10-CM | POA: Diagnosis not present

## 2019-07-24 DIAGNOSIS — K760 Fatty (change of) liver, not elsewhere classified: Secondary | ICD-10-CM

## 2019-07-24 DIAGNOSIS — G8929 Other chronic pain: Secondary | ICD-10-CM | POA: Diagnosis not present

## 2019-07-24 MED ORDER — METFORMIN HCL 500 MG PO TABS: ORAL_TABLET | ORAL | 0 refills | Status: DC

## 2019-07-24 MED ORDER — HYDROCODONE-ACETAMINOPHEN 7.5-325 MG PO TABS: ORAL_TABLET | ORAL | 0 refills | Status: DC

## 2019-07-24 MED ORDER — LOSARTAN POTASSIUM 50 MG PO TABS: 50.0000 mg | ORAL_TABLET | Freq: Every day | ORAL | 1 refills | Status: DC

## 2019-07-24 MED ORDER — ATORVASTATIN CALCIUM 20 MG PO TABS: 20.0000 mg | ORAL_TABLET | Freq: Every day | ORAL | 1 refills | Status: DC

## 2019-07-24 MED ORDER — PANTOPRAZOLE SODIUM 40 MG PO TBEC: 40.0000 mg | DELAYED_RELEASE_TABLET | Freq: Every day | ORAL | 1 refills | Status: DC

## 2019-07-24 MED ORDER — LEVOTHYROXINE SODIUM 88 MCG PO TABS: 88.0000 ug | ORAL_TABLET | Freq: Every day | ORAL | 1 refills | Status: DC

## 2019-07-24 MED ORDER — GLIPIZIDE ER 2.5 MG PO TB24: 2.5000 mg | ORAL_TABLET | Freq: Every day | ORAL | 1 refills | Status: DC

## 2019-07-24 MED ORDER — MELOXICAM 15 MG PO TABS: 15.0000 mg | ORAL_TABLET | Freq: Every day | ORAL | 1 refills | Status: DC

## 2019-07-24 NOTE — Progress Notes (Signed)
Subjective:    Patient ID: Nicole Lam, female    DOB: [birth year], 68 y.o.   MRN: [id number]  Hypertension This is a chronic problem. Pertinent negatives include no chest pain or shortness of breath. There are no compliance problems (takes meds everyday, tries to eat healthy, only exercise is at work. ).   Patient does take medicine on a regular basis tries to watch her diet to the best of her ability she stays very active at work  does not check blood sugar.  She does take her medicine she does not check her sugar she does try to watch her diet she does try to stay active she denies any low sugar spells she thinks is under decent control  Left shoulder pain. Pain has always been there. Needs letter to get a massage so insurance will pay for it.  She relates mild to moderate left shoulder blade pain and discomfort aches with certain movements she thinks it is related to her job  Needs hydrocodone refilled.  Patient relates that she uses hydrocodone infrequently denies abusing it.  She does have a history of fatty liver and mild obesity she does try to watch her diet tries to lose weight stays away from alcohol denies any bleeding issues  Patient has hypothyroidism recent blood work shows it needs adjusting therefore she will change it instead of 1/2 tablet 3 days a week should do 1/2 tablet 2 days a week she states her energy level seems to be doing okay  Virtual Visit via Telephone Note  I connected with Nicole Lam on 07/24/19 at  9:00 AM EDT by telephone and verified that I am speaking with the correct person using two identifiers.  Location: Patient: home Provider: office   I discussed the limitations, risks, security and privacy concerns of performing an evaluation and management service by telephone and the availability of in person appointments. I also discussed with the patient that there may be a patient responsible charge related to this service. The patient expressed  understanding and agreed to proceed.   History of Present Illness:    Observations/Objective:   Assessment and Plan:   Follow Up Instructions:    I discussed the assessment and treatment plan with the patient. The patient was provided an opportunity to ask questions and all were answered. The patient agreed with the plan and demonstrated an understanding of the instructions.   The patient was advised to call back or seek an in-person evaluation if the symptoms worsen or if the condition fails to improve as anticipated.  I provided 25 minutes of non-face-to-face time during this encounter.    25 minutes was spent with the patient.  This statement verifies that 25 minutes was indeed spent with the patient.  More than 50% of this visit-total duration of the visit-was spent in counseling and coordination of care. The issues that the patient came in for today as reflected in the diagnosis (s) please refer to documentation for further details.      Review of Systems  Constitutional: Negative for activity change, appetite change and fatigue.  HENT: Negative for congestion and rhinorrhea.   Respiratory: Negative for cough and shortness of breath.   Cardiovascular: Negative for chest pain and leg swelling.  Gastrointestinal: Negative for abdominal pain and diarrhea.  Endocrine: Negative for polydipsia and polyphagia.  Skin: Negative for color change.  Neurological: Negative for dizziness and weakness.  Psychiatric/Behavioral: Negative for behavioral problems and confusion.  Objective:   Physical Exam   Today's visit was via telephone Physical exam was not possible for this visit      Assessment & Plan:  1. Hypothyroidism, unspecified type We need to adjust the dose therefore instead of doing a half a tablet 3 days a week and 1 on all days The new dose will be half a tablet on Mondays and Fridays 1 on all other days Recheck lab work by spring  2. Fatty liver Watch  diet try to lose weight liver enzymes not terrible  3. Type 2 diabetes mellitus without complication, without long-term current use of insulin (HCC) Diabetes under fair control watch diet take medication denies any low sugar spells  4. Other hyperlipidemia Very important to get cholesterol under better control.  Patient states she will do a better job of taking her medicine and watching diet  5. Chronic bilateral low back pain with left-sided sciatica Uses anti-inflammatory stretching exercises occasionally uses hydrocodone  6. Pain in joint of left shoulder Having pain underneath left shoulder blade sure try massage see if that helps We will send a letter saying that she can do massage therapy Anti-inflammatories range of motion exercises

## 2019-08-21 DIAGNOSIS — H2513 Age-related nuclear cataract, bilateral: Secondary | ICD-10-CM | POA: Diagnosis not present

## 2019-08-21 DIAGNOSIS — Z0100 Encounter for examination of eyes and vision without abnormal findings: Secondary | ICD-10-CM | POA: Diagnosis not present

## 2019-09-03 ENCOUNTER — Telehealth: Payer: Self-pay | Admitting: Obstetrics and Gynecology

## 2019-09-03 NOTE — Telephone Encounter (Signed)

## 2019-09-04 ENCOUNTER — Ambulatory Visit (INDEPENDENT_AMBULATORY_CARE_PROVIDER_SITE_OTHER): Payer: Medicare HMO | Admitting: Obstetrics and Gynecology

## 2019-09-04 ENCOUNTER — Other Ambulatory Visit: Payer: Self-pay

## 2019-09-04 ENCOUNTER — Encounter: Payer: Self-pay | Admitting: Obstetrics and Gynecology

## 2019-09-04 VITALS — BP 162/76 | HR 66 | Ht 59.0 in | Wt 133.4 lb

## 2019-09-04 DIAGNOSIS — Z01419 Encounter for gynecological examination (general) (routine) without abnormal findings: Secondary | ICD-10-CM | POA: Diagnosis not present

## 2019-09-04 DIAGNOSIS — E039 Hypothyroidism, unspecified: Secondary | ICD-10-CM

## 2019-09-04 NOTE — Progress Notes (Signed)
Patient ID: Nicole Lam, female   DOB: 1951-07-13, 68 y.o.   MRN: [id number]  Assessment:  Annual Gyn Exam HTN Small rectocele Plan:  1. Pap NOT smear done, next exam per Dr Wolfgang Phoenix . No further Gyn exam , symptom based care. Refil HT by televisit 1 yr 2. return annually or prn 3    Annual mammogram advised after age 49 Subjective:  Nicole Lam is a 68 y.o. female G2P2001 who presents for annual exam. No LMP recorded. Patient has had a hysterectomy. The patient has complaints today of Has urge incontinence. She has to get up 2-3 times at night. She has had a complete hysterectomy.  Believes she has a hernia, when she bends over and feels "a roll" and she has to stand up and smooth out her stomach.   The following portions of the patient's history were reviewed and updated as appropriate: allergies, current medications, past family history, past medical history, past social history, past surgical history and problem list. Past Medical History:  Diagnosis Date  . Diabetes ([place])   . Fatty liver   . Hemorrhoid   . Hyperlipidemia   . Hypothyroidism   . Lipoma of neck     Past Surgical History:  Procedure Laterality Date  . ABDOMINAL HYSTERECTOMY    . APPENDECTOMY    . COLONOSCOPY  05/30/05   XLK:GMWNUU anal papilla, otherwise normal rectum.and colon  . COLONOSCOPY N/A 11/04/2015   RMR: normal colonoscopy  . DILATION AND CURETTAGE OF UTERUS       Current Outpatient Medications:  .  acyclovir (ZOVIRAX) 400 MG tablet, TAKE 1 TABLET BY MOUTH 5 TIMES A DAY, Disp: 75 tablet, Rfl: 0 .  aspirin 81 MG tablet, Take 81 mg by mouth at bedtime. , Disp: , Rfl:  .  atorvastatin (LIPITOR) 20 MG tablet, Take 1 tablet (20 mg total) by mouth daily., Disp: 90 tablet, Rfl: 1 .  cetirizine (ZYRTEC) 10 MG tablet, Take 10 mg by mouth daily., Disp: , Rfl:  .  diclofenac sodium (VOLTAREN) 1 % GEL, Apply 4 g topically 4 (four) times daily. (Patient taking differently: Apply 4 g topically as needed. ),  Disp: 5 Tube, Rfl: 5 .  estradiol (ESTRACE) 0.5 MG tablet, TAKE 1 TABLET EVERY DAY, Disp: 90 tablet, Rfl: 1 .  glipiZIDE (GLUCOTROL XL) 2.5 MG 24 hr tablet, Take 1 tablet (2.5 mg total) by mouth daily with breakfast., Disp: 90 tablet, Rfl: 1 .  HYDROcodone-acetaminophen (NORCO) 7.5-325 MG tablet, 1 q4 hours prn, Disp: 30 tablet, Rfl: 0 .  levothyroxine (SYNTHROID) 88 MCG tablet, Take 1 tablet (88 mcg total) by mouth daily., Disp: 90 tablet, Rfl: 1 .  losartan (COZAAR) 50 MG tablet, Take 1 tablet (50 mg total) by mouth daily., Disp: 90 tablet, Rfl: 1 .  meloxicam (MOBIC) 15 MG tablet, Take 1 tablet (15 mg total) by mouth daily., Disp: 90 tablet, Rfl: 1 .  metFORMIN (GLUCOPHAGE) 500 MG tablet, Take 2 qam and 2 in the evening, Disp: 360 tablet, Rfl: 0 .  pantoprazole (PROTONIX) 40 MG tablet, Take 1 tablet (40 mg total) by mouth daily., Disp: 90 tablet, Rfl: 1 .  phenylephrine-shark liver oil-mineral oil-petrolatum (PREPARATION H) 0.25-3-14-71.9 % rectal ointment, Place 1 application rectally 2 (two) times daily as needed for hemorrhoids., Disp: , Rfl:  .  polyethylene glycol (MIRALAX / GLYCOLAX) packet, Take 17 g by mouth daily as needed. , Disp: , Rfl:   Review of Systems Constitutional: negative Gastrointestinal: negative Genitourinary: urge  incontinence  Objective:  BP (!) 162/76 (BP Location: Right Arm, Patient Position: Sitting, Cuff Size: Normal)   Pulse 66   Ht 4' 11"  (1.499 m)   Wt 133 lb 6.4 oz (60.5 kg)   BMI 26.94 kg/m    BMI: Body mass index is 26.94 kg/m.  General Appearance: Alert, appropriate appearance for age. No acute distress HEENT: Grossly normal Neck / Thyroid:  Cardiovascular: RRR; normal S1, S2, no murmur Lungs: CTA bilaterally Back: No CVAT Breast Exam: not examined Gastrointestinal: Soft, non-tender, no masses or organomegaly, no hernia palpated Pelvic Exam: VAGINA: normal appearing vagina with good support CERVIX: surgically absent,  UTERUS: surgically  absent RECTAL: guaiac negative stool obtained, small rectocele pouch above good sphincter Lymphatic Exam: Non-palpable nodes in neck, clavicular, axillary, or inguinal regions Skin: no rash or abnormalities Neurologic: Normal gait and speech, no tremor  Psychiatric: Alert and oriented, appropriate affect.  Urinalysis:Not done  By signing my name below, I, Samul Dada, attest that this documentation has been prepared under the direction and in the presence of [name], MD. Electronically Signed: [place]. 09/04/19. 8:43 AM.  I personally performed the services described in this documentation, which was SCRIBED in my presence. The recorded information has been reviewed and considered accurate. It has been edited as necessary during review. [name], MD

## 2019-09-24 ENCOUNTER — Telehealth: Payer: Self-pay | Admitting: *Deleted

## 2019-09-24 NOTE — Telephone Encounter (Signed)
Pt wants to have fasting labs drawn tomorrow am. Will put on lab schedule.  [place]

## 2019-09-25 ENCOUNTER — Other Ambulatory Visit: Payer: Self-pay

## 2019-09-25 ENCOUNTER — Other Ambulatory Visit: Payer: Medicare HMO

## 2019-09-25 ENCOUNTER — Other Ambulatory Visit: Payer: Self-pay | Admitting: Obstetrics and Gynecology

## 2019-09-25 DIAGNOSIS — E039 Hypothyroidism, unspecified: Secondary | ICD-10-CM | POA: Diagnosis not present

## 2019-09-26 LAB — TSH: TSH: 0.86 u[IU]/mL (ref 0.450–4.500)

## 2019-09-29 NOTE — Progress Notes (Signed)
It appears that the patient is taking her thyroid medications.

## 2019-10-14 ENCOUNTER — Telehealth: Payer: Self-pay | Admitting: *Deleted

## 2019-10-14 NOTE — Telephone Encounter (Signed)
Called for thyroid results.  Informed level normal as noted by Dr Glo Herring as she is taking her medicine as prescribed.

## 2019-10-20 ENCOUNTER — Other Ambulatory Visit: Payer: Self-pay | Admitting: *Deleted

## 2019-10-20 MED ORDER — ESTRADIOL 0.5 MG PO TABS: 0.5000 mg | ORAL_TABLET | Freq: Every day | ORAL | 1 refills | Status: DC

## 2019-10-22 NOTE — Progress Notes (Signed)
Referring Provider: Kathyrn Drown, MD Primary Care Physician:  Kathyrn Drown, MD Primary GI Physician: Dr. Gala Romney  Chief Complaint  Patient presents with  . fatty liver    f/u.    HPI:   Nicole Lam is a 69 y.o. female presenting today for follow-up of fatty liver. History of likely [name] with mildly elevated LFTs and Metavir score F2/F3 in 2017.  Extensive serologies in the past including hepatitis B, hepatitis C, smooth muscle antibody, mitochondrial antibody, ANA, immunoglobulins, ceruloplasmin, TTG IgA all negative.  Last seen on 04/10/2019 for the same.  At that time she was doing well.  No obvious signs or symptoms of advanced liver disease.  Plans at that time were to complete abdominal ultrasound and follow-up on labs ordered by Dr. Wolfgang Phoenix.  Follow-up in 6 months.  Ultrasound completed on 04/24/2019 with evidence of hepatic steatosis.  No evidence of focal liver lesions. Labs completed on 07/17/2019.  LFTs continue to improve.  AST normal at 38, ALT slightly elevated but improved at 39 from 44 in September 2019, alk phos normal at 78 and total bilirubin normal 0.7.  Hemoglobin A1c slightly improved to 7.5 from 7.7.  Today:  No concerns today. No swelling in abdomen or LE. No yellowing of eyes or skin. No confusion or dark urine. No easy bruising. Has cut out soda for the most part. Drinks a lot of water. Baking more of her foods. Limiting fried foods. Has lost about 5 lbs. States at work, she is constantly moving. Otherwise, this is all the exervise she gets. Works 10 hour shifts. No alcohol. Tylenol in [place]. Maybe twice a week.  Having about 2-3 BMs a week. Takes MiraLAX a couple times a week to help with this. Stools are hard at times with straining. No blood int he stool. No blacks stools. No abdominal pain unless she gets constipated. No nausea or vomiting. No acid reflux or heartburn. No dysphagia.      Past Medical History:  Diagnosis Date  . Diabetes ([place])   . Fatty  liver   . Hemorrhoid   . Hyperlipidemia   . Hypothyroidism   . Lipoma of neck     Past Surgical History:  Procedure Laterality Date  . ABDOMINAL HYSTERECTOMY    . APPENDECTOMY    . COLONOSCOPY  05/30/05   SEG:BTDVVO anal papilla, otherwise normal rectum.and colon  . COLONOSCOPY N/A 11/04/2015   RMR: normal colonoscopy  . DILATION AND CURETTAGE OF UTERUS      Current Outpatient Medications  Medication Sig Dispense Refill  . acyclovir (ZOVIRAX) 400 MG tablet TAKE 1 TABLET BY MOUTH 5 TIMES A DAY 75 tablet 0  . aspirin 81 MG tablet Take 81 mg by mouth at bedtime.     Marland Kitchen atorvastatin (LIPITOR) 20 MG tablet Take 1 tablet (20 mg total) by mouth daily. 90 tablet 1  . cetirizine (ZYRTEC) 10 MG tablet Take 10 mg by mouth daily.    . diclofenac sodium (VOLTAREN) 1 % GEL Apply 4 g topically 4 (four) times daily. (Patient taking differently: Apply 4 g topically as needed. ) 5 Tube 5  . estradiol (ESTRACE) 0.5 MG tablet Take 1 tablet (0.5 mg total) by mouth daily. 90 tablet 1  . glipiZIDE (GLUCOTROL XL) 2.5 MG 24 hr tablet Take 1 tablet (2.5 mg total) by mouth daily with breakfast. 90 tablet 1  . HYDROcodone-acetaminophen (NORCO) 7.5-325 MG tablet 1 q4 hours prn 30 tablet 0  . levothyroxine (SYNTHROID)  88 MCG tablet Take 1 tablet (88 mcg total) by mouth daily. 90 tablet 1  . losartan (COZAAR) 50 MG tablet Take 1 tablet (50 mg total) by mouth daily. 90 tablet 1  . meloxicam (MOBIC) 15 MG tablet Take 1 tablet (15 mg total) by mouth daily. 90 tablet 1  . metFORMIN (GLUCOPHAGE) 500 MG tablet Take 2 qam and 2 in the evening 360 tablet 0  . pantoprazole (PROTONIX) 40 MG tablet Take 1 tablet (40 mg total) by mouth daily. 90 tablet 1  . phenylephrine-shark liver oil-mineral oil-petrolatum (PREPARATION H) 0.25-3-14-71.9 % rectal ointment Place 1 application rectally 2 (two) times daily as needed for hemorrhoids.    . polyethylene glycol (MIRALAX / GLYCOLAX) packet Take 17 g by mouth daily as needed.       No current facility-administered medications for this visit.    Allergies as of 10/23/2019  . (No Known Allergies)    Family History  Problem Relation Age of Onset  . Heart disease Mother   . Hypertension Mother   . Birth defects Daughter   . Colon cancer Neg Hx   . Liver disease Neg Hx     Social History   Socioeconomic History  . Marital status: Married    Spouse name: Not on file  . Number of children: Not on file  . Years of education: Not on file  . Highest education level: Not on file  Occupational History  . Not on file  Tobacco Use  . Smoking status: Never Smoker  . Smokeless tobacco: Never Used  Substance and Sexual Activity  . Alcohol use: No    Alcohol/week: 0.0 standard drinks  . Drug use: No  . Sexual activity: Yes    Birth control/protection: Surgical    Comment: hyst  Other Topics Concern  . Not on file  Social History Narrative  . Not on file   Social Determinants of Health   Financial Resource Strain:   . Difficulty of Paying Living Expenses: Not on file  Food Insecurity:   . Worried About Charity fundraiser in the Last Year: Not on file  . Ran Out of Food in the Last Year: Not on file  Transportation Needs:   . Lack of Transportation (Medical): Not on file  . Lack of Transportation (Non-Medical): Not on file  Physical Activity:   . Days of Exercise per Week: Not on file  . Minutes of Exercise per Session: Not on file  Stress:   . Feeling of Stress : Not on file  Social Connections:   . Frequency of Communication with Friends and Family: Not on file  . Frequency of Social Gatherings with Friends and Family: Not on file  . Attends Religious Services: Not on file  . Active Member of Clubs or Organizations: Not on file  . Attends Archivist Meetings: Not on file  . Marital Status: Not on file    Review of Systems: Gen: Denies fever, chills.  Admits to occasional dizziness if standing too fast.  CV: Denies chest pain or  palpitations Resp: Shortness of breath with exertion at times.  No shortness of breath at rest.  Occasional cough.  GI: See HPI Derm: Denies rash Psych: Admits to anxiety.  Heme: See HPI  Physical Exam: BP (!) 146/66   Pulse 70   Temp (!) 97.5 F (36.4 C) (Oral)   Ht 4' 10.5" (1.486 m)   Wt 130 lb 6.4 oz (59.1 kg)   BMI 26.79  kg/m  General:   Alert and oriented. No distress noted. Pleasant and cooperative.  Head:  Normocephalic and atraumatic. Eyes:  Conjuctiva clear without scleral icterus. Heart:  S1, S2 present without murmurs appreciated. Lungs:  Clear to auscultation bilaterally. No wheezes, rales, or rhonchi. No distress.  Abdomen:  +BS, soft, non-tender and non-distended. No rebound or guarding. No HSM or masses noted. Msk:  Symmetrical without gross deformities. Normal posture. Extremities:  Without edema. Neurologic:  Alert and  oriented x4 Psych: Normal mood and affect.

## 2019-10-23 ENCOUNTER — Ambulatory Visit: Payer: Medicare HMO | Admitting: Gastroenterology

## 2019-10-23 ENCOUNTER — Other Ambulatory Visit: Payer: Self-pay

## 2019-10-23 ENCOUNTER — Encounter: Payer: Self-pay | Admitting: Gastroenterology

## 2019-10-23 VITALS — BP 146/66 | HR 70 | Temp 97.5°F | Ht 58.5 in | Wt 130.4 lb

## 2019-10-23 DIAGNOSIS — R945 Abnormal results of liver function studies: Secondary | ICD-10-CM

## 2019-10-23 DIAGNOSIS — K5900 Constipation, unspecified: Secondary | ICD-10-CM

## 2019-10-23 DIAGNOSIS — K760 Fatty (change of) liver, not elsewhere classified: Secondary | ICD-10-CM

## 2019-10-23 DIAGNOSIS — R7989 Other specified abnormal findings of blood chemistry: Secondary | ICD-10-CM

## 2019-10-23 NOTE — Assessment & Plan Note (Signed)
Addressed under fatty liver.  

## 2019-10-23 NOTE — Assessment & Plan Note (Addendum)
69 year old female with history of fatty liver, suspected [name] with chronically mildly elevated LFTs. Elastography in 2017 with mildly nodular hepatic border, F2/F3 scores.  Extensive serologies in the past including hepatitis B, hepatitis C, smooth muscle antibody, mitochondrial antibody, ANA, immunoglobulins, ceruloplasmin, TTG IgA all negative.  Most recent ultrasound on 04/24/2019 with evidence of hepatic steatosis.  No focal liver lesions or nodular border mentioned.  Most recent labs in October 2020 with LFTs continuing to improve.  AST normal at 38, ALT slightly elevated but improved at 39, down from 44 in September 2019, alk phos normal at 78, and total bilirubin normal at 0.7.  Also with slight improvement in hemoglobin A1c from 7.7 down to 7.5.  Additionally she has lost about 5 pounds since June 2020.  States she has cut out soda for the most part.  Eating mostly baked meats.  Constantly moving at work for 10 hours a day.  She is without any signs or symptoms of advanced liver disease.  Congratulated patient on improvement in A1c as well as weight loss.  As LFTs have continued to improve,  imaging is without obvious signs of cirrhosis, and patient is without signs or symptoms of advanced disease,we  will continue to monitor.   Instructions for fatty liver: Recommend 1-2# weight loss per week until ideal body weight through exercise & diet. Low fat/cholesterol diet.   Avoid sweets, sodas, fruit juices, sweetened beverages like tea, etc. Gradually increase exercise from 15 min daily up to 1 hr per day 5 days/week. Limit alcohol use. Limit tylenol use. No more than 2 g daily.  Continue following closely with PCP for good control of diabetes and cholesterol.  Follow-up in 6 months.

## 2019-10-23 NOTE — Patient Instructions (Addendum)
Instructions for fatty liver: Recommend 1-2# weight loss per week until ideal body weight through exercise & diet until normal BMI is reached Low fat/cholesterol diet.   Avoid sweets, sodas, fruit juices, sweetened beverages like tea, etc. Gradually increase exercise from 15 min daily up to 1 hr per day 5 days/week. Limit alcohol use.  Limit tylenol use. No more than 2 g a day.   Continue to work on good control of your diabetes and cholesterol.   Increase MiraLAX to 1 capful daily.    We will see you back in 6 months.   Aliene Altes, PA-C [facility]

## 2019-10-23 NOTE — Assessment & Plan Note (Signed)
Mild constipation.  Currently with 2-3 BMs a week.  Taking MiraLAX a couple times a week.  She was advised to increase MiraLAX to 1 capful (17 g) daily.  May decrease if she develops frequent loose stools.  Follow-up in 6 months

## 2019-10-26 NOTE — Progress Notes (Signed)
CC'ED TO PCP 

## 2019-11-30 ENCOUNTER — Other Ambulatory Visit: Payer: Self-pay | Admitting: Family Medicine

## 2019-11-30 NOTE — Telephone Encounter (Signed)
May have 90-day needs follow-up by later in the spring

## 2019-12-05 ENCOUNTER — Other Ambulatory Visit: Payer: Self-pay | Admitting: Family Medicine

## 2019-12-07 NOTE — Telephone Encounter (Signed)
May have 90-day Needs to do a follow-up visit virtual or in person

## 2019-12-16 ENCOUNTER — Telehealth: Payer: Self-pay | Admitting: Family Medicine

## 2019-12-16 DIAGNOSIS — E785 Hyperlipidemia, unspecified: Secondary | ICD-10-CM

## 2019-12-16 DIAGNOSIS — E119 Type 2 diabetes mellitus without complications: Secondary | ICD-10-CM

## 2019-12-16 DIAGNOSIS — R5383 Other fatigue: Secondary | ICD-10-CM

## 2019-12-16 DIAGNOSIS — I10 Essential (primary) hypertension: Secondary | ICD-10-CM

## 2019-12-16 DIAGNOSIS — E039 Hypothyroidism, unspecified: Secondary | ICD-10-CM

## 2019-12-16 NOTE — Telephone Encounter (Signed)
Pt is needing lab work done for April appt.

## 2019-12-16 NOTE — Telephone Encounter (Signed)
Last labs 07/17/19 a1c, tsh, bmp, urine microalb, liver, lipid

## 2019-12-16 NOTE — Telephone Encounter (Signed)
Orders put in and pt was notified.

## 2019-12-16 NOTE — Telephone Encounter (Signed)
Lipid, liver, metabolic 7, T4L, TSH CBC Hyperlipidemia hypertension diabetes hypothyroidism other fatigue

## 2019-12-18 DIAGNOSIS — E119 Type 2 diabetes mellitus without complications: Secondary | ICD-10-CM | POA: Diagnosis not present

## 2019-12-18 DIAGNOSIS — E785 Hyperlipidemia, unspecified: Secondary | ICD-10-CM | POA: Diagnosis not present

## 2019-12-18 DIAGNOSIS — E039 Hypothyroidism, unspecified: Secondary | ICD-10-CM | POA: Diagnosis not present

## 2019-12-18 DIAGNOSIS — R5383 Other fatigue: Secondary | ICD-10-CM | POA: Diagnosis not present

## 2019-12-18 DIAGNOSIS — I10 Essential (primary) hypertension: Secondary | ICD-10-CM | POA: Diagnosis not present

## 2019-12-19 LAB — BASIC METABOLIC PANEL
BUN/Creatinine Ratio: 11 — ABNORMAL LOW (ref 12–28)
BUN: 11 mg/dL (ref 8–27)
CO2: 24 mmol/L (ref 20–29)
Calcium: 9.3 mg/dL (ref 8.7–10.3)
Chloride: 100 mmol/L (ref 96–106)
Creatinine, Ser: 0.99 mg/dL (ref 0.57–1.00)
GFR calc Af Amer: 67 mL/min/{1.73_m2} (ref >59)
GFR calc non Af Amer: 58 mL/min/{1.73_m2} — ABNORMAL LOW (ref >59)
Glucose: 128 mg/dL — ABNORMAL HIGH (ref 65–99)
Potassium: 4.7 mmol/L (ref 3.5–5.2)
Sodium: 138 mmol/L (ref 134–144)

## 2019-12-19 LAB — HEPATIC FUNCTION PANEL
ALT: 37 IU/L — ABNORMAL HIGH (ref 0–32)
AST: 45 IU/L — ABNORMAL HIGH (ref 0–40)
Albumin: 4.5 g/dL (ref 3.8–4.8)
Alkaline Phosphatase: 75 IU/L (ref 39–117)
Bilirubin Total: 0.6 mg/dL (ref 0.0–1.2)
Bilirubin, Direct: 0.15 mg/dL (ref 0.00–0.40)
Total Protein: 7.1 g/dL (ref 6.0–8.5)

## 2019-12-19 LAB — CBC WITH DIFFERENTIAL/PLATELET
Basophils Absolute: 0.1 10*3/uL (ref 0.0–0.2)
Basos: 1 %
EOS (ABSOLUTE): 0.2 10*3/uL (ref 0.0–0.4)
Eos: 4 %
Hematocrit: 35.4 % (ref 34.0–46.6)
Hemoglobin: 10.5 g/dL — ABNORMAL LOW (ref 11.1–15.9)
Immature Grans (Abs): 0 10*3/uL (ref 0.0–0.1)
Immature Granulocytes: 0 %
Lymphocytes Absolute: 2.2 10*3/uL (ref 0.7–3.1)
Lymphs: 41 %
MCH: 22.9 pg — ABNORMAL LOW (ref 26.6–33.0)
MCHC: 29.7 g/dL — ABNORMAL LOW (ref 31.5–35.7)
MCV: 77 fL — ABNORMAL LOW (ref 79–97)
Monocytes Absolute: 0.4 10*3/uL (ref 0.1–0.9)
Monocytes: 7 %
Neutrophils Absolute: 2.4 10*3/uL (ref 1.4–7.0)
Neutrophils: 47 %
Platelets: 330 10*3/uL (ref 150–450)
RBC: 4.58 x10E6/uL (ref 3.77–5.28)
RDW: 16.8 % — ABNORMAL HIGH (ref 11.7–15.4)
WBC: 5.3 10*3/uL (ref 3.4–10.8)

## 2019-12-19 LAB — HEMOGLOBIN A1C
Est. average glucose Bld gHb Est-mCnc: 163 mg/dL
Hgb A1c MFr Bld: 7.3 % — ABNORMAL HIGH (ref 4.8–5.6)

## 2019-12-19 LAB — LIPID PANEL
Chol/HDL Ratio: 4 ratio (ref 0.0–4.4)
Cholesterol, Total: 161 mg/dL (ref 100–199)
HDL: 40 mg/dL (ref >39)
LDL Chol Calc (NIH): 96 mg/dL (ref 0–99)
Triglycerides: 141 mg/dL (ref 0–149)
VLDL Cholesterol Cal: 25 mg/dL (ref 5–40)

## 2019-12-19 LAB — TSH: TSH: 4.48 u[IU]/mL (ref 0.450–4.500)

## 2019-12-22 ENCOUNTER — Other Ambulatory Visit: Payer: Self-pay | Admitting: Family Medicine

## 2019-12-22 DIAGNOSIS — D509 Iron deficiency anemia, unspecified: Secondary | ICD-10-CM

## 2019-12-25 DIAGNOSIS — D509 Iron deficiency anemia, unspecified: Secondary | ICD-10-CM | POA: Diagnosis not present

## 2019-12-26 LAB — FERRITIN: Ferritin: 10 ng/mL — ABNORMAL LOW (ref 15–150)

## 2019-12-26 LAB — IRON AND TIBC
Iron Saturation: 6 % — CL (ref 15–55)
Iron: 26 ug/dL — ABNORMAL LOW (ref 27–139)
Total Iron Binding Capacity: 418 ug/dL (ref 250–450)
UIBC: 392 ug/dL — ABNORMAL HIGH (ref 118–369)

## 2019-12-26 LAB — HEMOGLOBIN AND HEMATOCRIT, BLOOD
Hematocrit: 33.7 % — ABNORMAL LOW (ref 34.0–46.6)
Hemoglobin: 10.3 g/dL — ABNORMAL LOW (ref 11.1–15.9)

## 2019-12-31 ENCOUNTER — Ambulatory Visit: Payer: Medicare HMO

## 2019-12-31 ENCOUNTER — Other Ambulatory Visit: Payer: Self-pay | Admitting: *Deleted

## 2019-12-31 ENCOUNTER — Other Ambulatory Visit: Payer: Self-pay | Admitting: Family Medicine

## 2019-12-31 DIAGNOSIS — D509 Iron deficiency anemia, unspecified: Secondary | ICD-10-CM

## 2019-12-31 LAB — IFOBT (OCCULT BLOOD): IFOBT: POSITIVE

## 2020-01-01 ENCOUNTER — Other Ambulatory Visit: Payer: Self-pay | Admitting: Family Medicine

## 2020-01-01 DIAGNOSIS — D509 Iron deficiency anemia, unspecified: Secondary | ICD-10-CM

## 2020-01-01 NOTE — Progress Notes (Signed)
error 

## 2020-01-04 ENCOUNTER — Encounter: Payer: Self-pay | Admitting: Family Medicine

## 2020-01-07 ENCOUNTER — Ambulatory Visit: Payer: Medicare HMO | Admitting: Gastroenterology

## 2020-01-07 ENCOUNTER — Other Ambulatory Visit: Payer: Self-pay

## 2020-01-07 ENCOUNTER — Encounter: Payer: Self-pay | Admitting: Gastroenterology

## 2020-01-07 ENCOUNTER — Encounter: Payer: Self-pay | Admitting: *Deleted

## 2020-01-07 ENCOUNTER — Telehealth: Payer: Self-pay | Admitting: *Deleted

## 2020-01-07 VITALS — BP 137/65 | HR 88 | Temp 97.1°F | Ht 59.5 in | Wt 131.2 lb

## 2020-01-07 DIAGNOSIS — K760 Fatty (change of) liver, not elsewhere classified: Secondary | ICD-10-CM | POA: Diagnosis not present

## 2020-01-07 DIAGNOSIS — D509 Iron deficiency anemia, unspecified: Secondary | ICD-10-CM

## 2020-01-07 DIAGNOSIS — K219 Gastro-esophageal reflux disease without esophagitis: Secondary | ICD-10-CM

## 2020-01-07 MED ORDER — CLENPIQ 10-3.5-12 MG-GM -GM/160ML PO SOLN: 1.0000 | Freq: Once | ORAL | 0 refills | Status: AC

## 2020-01-07 NOTE — Patient Instructions (Signed)
We are arranging a colonoscopy and endoscopy in the near future with Dr. Gala Romney.  Please do not take your oral diabetes medications the day of the procedure.  Continue to avoid BC powders, Goody powders, Ibuprofen, Advil, Aleve, Motrin, etc.   We will see you in follow-up thereafter!  I enjoyed seeing you again today! As you know, I value our relationship and want to provide genuine, compassionate, and quality care. I welcome your feedback. If you receive a survey regarding your visit,  I greatly appreciate you taking time to fill this out. See you next time!  Annitta Needs, PhD, ANP-BC [facility]

## 2020-01-07 NOTE — Progress Notes (Signed)
Referring Provider: Kathyrn Drown, MD Primary Care Physician:  Kathyrn Drown, MD  Primary GI: Dr. Gala Romney   Chief Complaint  Patient presents with  . Blood In Stools    hasn't seen blood, tested +    HPI:   Nicole Lam is a 69 y.o. female presenting today with a history of  mildly elevated transaminases, fatty liver on ultrasound with Metavir score F2/F3. Likely dealing with [name]. Serologies in 2017 with negative Hep C, negative Hep B surface antigen, normal ceruloplasmin, AMA/ASMA negative, celiac serologies negative. No evidence for iron overload in the past. US abdomen July 2020 with fatty liver.   Found to have new onset microcytic anemia with Hgb 10.5., ferritin 10. Previously Hgb 11.6 two years ago. Hemoccult positive. Last colonoscopy 2017 normal.   Feels tired but works 48 hours a week. No abdominal pain. Puts Miralax in morning coffee to keep her regular. BM usually after breakfast. No overt GI bleeding, denying melena or hematochezia. Has a good appetite. Can't swallow big pills. No solid food dysphagia. Protonix daily for GERD. If not taking it, while eating she feels like she can't breathe. Was taken off aspirin and meloxicam when found to have lower Hgb. Used to take [facility] powders if bad headache.   When bending over to put her socks on, feels a big knot in LUQ and feels like she has to press it to "roll it down". Doesn't happen all the time. Doesn't feel like a protrusion.   Reports she was told she had history of an ulcer many years ago. No records in system.   Past Medical History:  Diagnosis Date  . Diabetes ([place])   . Fatty liver   . Hemorrhoid   . Hyperlipidemia   . Hypothyroidism   . Lipoma of neck     Past Surgical History:  Procedure Laterality Date  . ABDOMINAL HYSTERECTOMY    . APPENDECTOMY    . COLONOSCOPY  05/30/05   [id number]:[id number] anal papilla, otherwise normal rectum.and colon  . COLONOSCOPY N/A 11/04/2015   RMR: normal colonoscopy  . DILATION AND  CURETTAGE OF UTERUS      Current Outpatient Medications  Medication Sig Dispense Refill  . acyclovir (ZOVIRAX) 400 MG tablet TAKE 1 TABLET BY MOUTH 5 TIMES A DAY (Patient taking differently: as needed. ) 75 tablet 0  . atorvastatin (LIPITOR) 20 MG tablet TAKE 1 TABLET DAILY. 90 tablet 0  . cetirizine (ZYRTEC) 10 MG tablet Take 10 mg by mouth daily.    . diclofenac sodium (VOLTAREN) 1 % GEL Apply 4 g topically 4 (four) times daily. (Patient taking differently: Apply 4 g topically as needed. ) 5 Tube 5  . estradiol (ESTRACE) 0.5 MG tablet Take 1 tablet (0.5 mg total) by mouth daily. 90 tablet 1  . glipiZIDE (GLUCOTROL XL) 2.5 MG 24 hr tablet TAKE 1 TABLET DAILY WITH BREAKFAST. 90 tablet 0  . HYDROcodone-acetaminophen (NORCO) 7.5-325 MG tablet 1 q4 hours prn 30 tablet 0  . levothyroxine (SYNTHROID) 88 MCG tablet Take 1 tablet (88 mcg total) by mouth daily. 90 tablet 1  . losartan (COZAAR) 50 MG tablet Take 1 tablet (50 mg total) by mouth daily. 90 tablet 1  . metFORMIN (GLUCOPHAGE) 500 MG tablet TAKE 2 TABLETS IN THE MORNING AND 2 TABLETS IN THE EVENING 360 tablet 0  . phenylephrine-shark liver oil-mineral oil-petrolatum (PREPARATION H) 0.25-3-14-71.9 % rectal ointment Place 1 application rectally 2 (two) times daily as needed for hemorrhoids.    Marland Kitchen  polyethylene glycol (MIRALAX / GLYCOLAX) packet Take 17 g by mouth daily as needed.     . pantoprazole (PROTONIX) 40 MG tablet TAKE 1 TABLET DAILY. 90 tablet 1   No current facility-administered medications for this visit.    Allergies as of 01/07/2020  . (No Known Allergies)    Family History  Problem Relation Age of Onset  . Heart disease Mother   . Hypertension Mother   . Birth defects Daughter   . Colon cancer Neg Hx   . Liver disease Neg Hx   . Colon polyps Neg Hx     Social History   Socioeconomic History  . Marital status: Married    Spouse name: Not on file  . Number of children: Not on file  . Years of education: Not on file   . Highest education level: Not on file  Occupational History  . Not on file  Tobacco Use  . Smoking status: Never Smoker  . Smokeless tobacco: Never Used  Substance and Sexual Activity  . Alcohol use: No    Alcohol/week: 0.0 standard drinks  . Drug use: No  . Sexual activity: Yes    Birth control/protection: Surgical    Comment: hyst  Other Topics Concern  . Not on file  Social History Narrative  . Not on file   Social Determinants of Health   Financial Resource Strain:   . Difficulty of Paying Living Expenses:   Food Insecurity:   . Worried About Charity fundraiser in the Last Year:   . Arboriculturist in the Last Year:   Transportation Needs:   . Film/video editor (Medical):   Marland Kitchen Lack of Transportation (Non-Medical):   Physical Activity:   . Days of Exercise per Week:   . Minutes of Exercise per Session:   Stress:   . Feeling of Stress :   Social Connections:   . Frequency of Communication with Friends and Family:   . Frequency of Social Gatherings with Friends and Family:   . Attends Religious Services:   . Active Member of Clubs or Organizations:   . Attends Archivist Meetings:   Marland Kitchen Marital Status:     Review of Systems: Gen: see HPI CV: Denies chest pain, palpitations, syncope, peripheral edema, and claudication. Resp: Denies dyspnea at rest, cough, wheezing, coughing up blood, and pleurisy. GI: see HPI Derm: Denies rash, itching, dry skin Psych: Denies depression, anxiety, memory loss, confusion. No homicidal or suicidal ideation.  Heme: see HPI  Physical Exam: BP 137/65   Pulse 88   Temp (!) 97.1 F (36.2 C) (Temporal)   Ht 4' 11.5" (1.511 m)   Wt 131 lb 3.2 oz (59.5 kg)   BMI 26.06 kg/m  General:   Alert and oriented. No distress noted. Pleasant and cooperative.  Head:  Normocephalic and atraumatic. Eyes:  Conjuctiva clear without scleral icterus. Mouth:  Mask in place Cardiac:  Clear bilaterally Lungs: S1 S2 present without  murmurs  Abdomen:  +BS, soft, non-tender and non-distended. No rebound or guarding. No HSM or masses noted. No obvious hernia Msk:  Symmetrical without gross deformities. Normal posture. Extremities:  Without edema. Neurologic:  Alert and  oriented x4 Psych:  Alert and cooperative. Normal mood and affect.  Lab Results  Component Value Date   WBC 5.3 12/18/2019   HGB 10.3 (L) 12/25/2019   HCT 33.7 (L) 12/25/2019   MCV 77 (L) 12/18/2019   PLT 330 12/18/2019   Lab Results  Component Value Date   IRON 26 (L) 12/25/2019   TIBC 418 12/25/2019   FERRITIN 10 (L) 12/25/2019     ASSESSMENT: Nicole Lam is a 69 y.o. female seen today at request of Dr. Wolfgang Phoenix due to new onset microcytic anemia consistent with IDA, heme positive stools. Last colonoscopy in 2017. Known history of likely [name], with history of mildly elevated transaminases in the past and undergoing extensive serologic evaluation. With prior elastography score F2/F3, favoring yearly ultrasounds due to risk of fibrosis progression.   IDA: with heme positive stool. In setting of BC powders, aspirin, and meloxicam, query occult upper GI source but unable to exclude loss anywhere in GI tract. Last colonoscopy 2017 normal, but she has never had an EGD. Doubt dealing with occult malignancy, but as it has been several years, needs updated colonoscopy/EGD in near future.   Fatty liver: HFP in Oct 2020 with ALT 39, otherwise normal LFTs. Update [place] July 2021.    PLAN:   Proceed with TCS/EGD with Dr. Gala Romney in near future: the risks, benefits, and alternatives have been discussed with the patient in detail. The patient states understanding and desires to proceed. Propofol due to polypharmacy  Avoid NSAIDs going forward as she is doing  Continue Protonix daily  Follow-up July 2021 and arrange [place] at that time  Serial following of LFTs  Annitta Needs, PhD, ANP-BC [facility]

## 2020-01-07 NOTE — Telephone Encounter (Signed)
PA approved for TCS/EGD. Auth# [id number] Dates 04/07/2020-05/07/2020

## 2020-01-09 ENCOUNTER — Other Ambulatory Visit: Payer: Self-pay | Admitting: Family Medicine

## 2020-01-11 NOTE — Telephone Encounter (Signed)
Med check up 07/24/19

## 2020-01-12 DIAGNOSIS — D509 Iron deficiency anemia, unspecified: Secondary | ICD-10-CM | POA: Insufficient documentation

## 2020-01-12 NOTE — Progress Notes (Signed)
Cc'ed to pcp °

## 2020-01-23 ENCOUNTER — Other Ambulatory Visit: Payer: Self-pay | Admitting: Family Medicine

## 2020-01-29 ENCOUNTER — Encounter: Payer: Self-pay | Admitting: Family Medicine

## 2020-01-29 ENCOUNTER — Other Ambulatory Visit: Payer: Self-pay

## 2020-01-29 ENCOUNTER — Ambulatory Visit (INDEPENDENT_AMBULATORY_CARE_PROVIDER_SITE_OTHER): Payer: Medicare HMO | Admitting: Family Medicine

## 2020-01-29 VITALS — BP 128/68 | Temp 98.4°F

## 2020-01-29 DIAGNOSIS — G8929 Other chronic pain: Secondary | ICD-10-CM

## 2020-01-29 DIAGNOSIS — K760 Fatty (change of) liver, not elsewhere classified: Secondary | ICD-10-CM

## 2020-01-29 DIAGNOSIS — E119 Type 2 diabetes mellitus without complications: Secondary | ICD-10-CM | POA: Diagnosis not present

## 2020-01-29 DIAGNOSIS — M5442 Lumbago with sciatica, left side: Secondary | ICD-10-CM

## 2020-01-29 DIAGNOSIS — E039 Hypothyroidism, unspecified: Secondary | ICD-10-CM

## 2020-01-29 DIAGNOSIS — R7989 Other specified abnormal findings of blood chemistry: Secondary | ICD-10-CM

## 2020-01-29 DIAGNOSIS — R945 Abnormal results of liver function studies: Secondary | ICD-10-CM | POA: Diagnosis not present

## 2020-01-29 MED ORDER — LEVOTHYROXINE SODIUM 88 MCG PO TABS: 88.0000 ug | ORAL_TABLET | Freq: Every day | ORAL | 1 refills | Status: DC

## 2020-01-29 MED ORDER — HYDROCODONE-ACETAMINOPHEN 7.5-325 MG PO TABS: ORAL_TABLET | ORAL | 0 refills | Status: DC

## 2020-01-29 MED ORDER — ATORVASTATIN CALCIUM 20 MG PO TABS: 20.0000 mg | ORAL_TABLET | Freq: Every day | ORAL | 1 refills | Status: DC

## 2020-01-29 MED ORDER — METFORMIN HCL 500 MG PO TABS: ORAL_TABLET | ORAL | 1 refills | Status: DC

## 2020-01-29 MED ORDER — GLIPIZIDE ER 2.5 MG PO TB24: 2.5000 mg | ORAL_TABLET | Freq: Every day | ORAL | 1 refills | Status: DC

## 2020-01-29 MED ORDER — PANTOPRAZOLE SODIUM 40 MG PO TBEC: 40.0000 mg | DELAYED_RELEASE_TABLET | Freq: Every day | ORAL | 1 refills | Status: DC

## 2020-01-29 MED ORDER — LOSARTAN POTASSIUM 50 MG PO TABS: 50.0000 mg | ORAL_TABLET | Freq: Every day | ORAL | 1 refills | Status: DC

## 2020-01-29 NOTE — Progress Notes (Signed)
Subjective:    Patient ID: Nicole Lam, female    DOB: 1950-10-27, 69 y.o.   MRN: [id number]  HPI Patient comes in today for follow up.  Hypertension. This is a chronic problem. Patient tried to watch her diet and gets exercise at work. Results for orders placed or performed in visit on 12/31/19  IFOBT POC (occult bld, rslt in office)  Result Value Ref Range   IFOBT Positive    Patient has diabetes but does not check sugars. She does take her medications and watches what she eats. Type 2 diabetes mellitus without complication, without long-term current use of insulin (HCC)  Abnormal LFTs  Fatty liver  Hypothyroidism, unspecified type  Chronic bilateral low back pain with left-sided sciatica    Review of Systems  Constitutional: Negative for activity change, appetite change and fatigue.  HENT: Negative for congestion and rhinorrhea.   Respiratory: Negative for cough and shortness of breath.   Cardiovascular: Negative for chest pain and leg swelling.  Gastrointestinal: Negative for abdominal pain and diarrhea.  Endocrine: Negative for polydipsia and polyphagia.  Skin: Negative for color change.  Neurological: Negative for dizziness and weakness.  Psychiatric/Behavioral: Negative for behavioral problems and confusion.       Objective:   Physical Exam Vitals reviewed.  Constitutional:      General: She is not in acute distress. HENT:     Head: Normocephalic and atraumatic.  Eyes:     General:        Right eye: No discharge.        Left eye: No discharge.  Neck:     Trachea: No tracheal deviation.  Cardiovascular:     Rate and Rhythm: Normal rate and regular rhythm.     Heart sounds: Normal heart sounds. No murmur.  Pulmonary:     Effort: Pulmonary effort is normal. No respiratory distress.     Breath sounds: Normal breath sounds.  Lymphadenopathy:     Cervical: No cervical adenopathy.  Skin:    General: Skin is warm and dry.  Neurological:     Mental  Status: She is alert.     Coordination: Coordination normal.  Psychiatric:        Behavior: Behavior normal.       Fall Risk  01/29/2020 09/04/2019 07/24/2019 03/06/2016 10/22/2014  Falls in the past year? 0 1 0 No No  Number falls in past yr: - 0 - - -  Injury with Fall? - 0 - - -  Follow up - - Falls evaluation completed - -       Assessment & Plan:  1. Type 2 diabetes mellitus without complication, without long-term current use of insulin ([place]) Diabetes fairly decent control. Do a better job with dietary measures. Adjust medications.  2. Abnormal LFTs Fatty liver try to lose weight watch diet stay physically active  3. Fatty liver Fatty liver liver enzymes elevated but not in a dangerous range do the best he can at diet  4. Hypothyroidism, unspecified type Hypothyroidism taking medication on regular basis labs reviewed in detail  Mild elevation of LDL but patient does not want to go up on statins states she has too much joint pains with the current dose and does not want to go up  Takes her estrogen on a regular basis would like to continue this. States it helps her function  Chronic low back pain denies abusing pain medicine pain medicine prescribed caution drowsiness home use only drug registry checked  Her  hemoglobin is slightly low we stopped all NSAIDs via phone message received records her stool test positive therefore we referred her for GI they would do a colonoscopy in June  30 minutes spent with patient reviewing labs reviewing medications discussing with the patient and documentation

## 2020-01-29 NOTE — Patient Instructions (Signed)

## 2020-03-30 NOTE — Patient Instructions (Signed)
Your procedure is scheduled on: 04/07/2020  Report to [facility] at   8:15  AM.  Call this number if you have problems the morning of surgery: [phone]   Remember:              Follow Directions on the letter you received from Your Physician's office regarding the Bowel Prep              No Smoking the day of Procedure :   Take these medicines the morning of surgery with A SIP OF WATER: Zyrtec, levothyroxine, and protonix   Do not wear jewelry, make-up or nail polish.    Do not bring valuables to the hospital.  Contacts, dentures or bridgework may not be worn into surgery.  .   Patients discharged the day of surgery will not be allowed to drive home.     Colonoscopy, Adult, Care After This sheet gives you information about how to care for yourself after your procedure. Your health care provider may also give you more specific instructions. If you have problems or questions, contact your health care provider. What can I expect after the procedure? After the procedure, it is common to have:  A small amount of blood in your stool for 24 hours after the procedure.  Some gas.  Mild abdominal cramping or bloating.  Follow these instructions at home: General instructions   For the first 24 hours after the procedure: ? Do not drive or use machinery. ? Do not sign important documents. ? Do not drink alcohol. ? Do your regular daily activities at a slower pace than normal. ? Eat soft, easy-to-digest foods. ? Rest often.  Take over-the-counter or prescription medicines only as told by your health care provider.  It is up to you to get the results of your procedure. Ask your health care provider, or the department performing the procedure, when your results will be ready. Relieving cramping and bloating  Try walking around when you have cramps or feel bloated.  Apply heat to your abdomen as told by your health care provider. Use a heat source that your health care  provider recommends, such as a moist heat pack or a heating pad. ? Place a towel between your skin and the heat source. ? Leave the heat on for 20-30 minutes. ? Remove the heat if your skin turns bright red. This is especially important if you are unable to feel pain, heat, or cold. You may have a greater risk of getting burned. Eating and drinking  Drink enough fluid to keep your urine clear or pale yellow.  Resume your normal diet as instructed by your health care provider. Avoid heavy or fried foods that are hard to digest.  Avoid drinking alcohol for as long as instructed by your health care provider. Contact a health care provider if:  You have blood in your stool 2-3 days after the procedure. Get help right away if:  You have more than a small spotting of blood in your stool.  You pass large blood clots in your stool.  Your abdomen is swollen.  You have nausea or vomiting.  You have a fever.  You have increasing abdominal pain that is not relieved with medicine. This information is not intended to replace advice given to you by your health care provider. Make sure you discuss any questions you have with your health care provider. Document Released: 05/15/2004 Document Revised: 06/25/2016 Document Reviewed: 12/13/2015 Elsevier Interactive Patient Education  2018  [facility], Adult, Care After This sheet gives you information about how to care for yourself after your procedure. Your health care provider may also give you more specific instructions. If you have problems or questions, contact your health care provider. What can I expect after the procedure? After the procedure, it is common to have:  A sore throat.  Mild stomach pain or discomfort.  Bloating.  Nausea. Follow these instructions at home:   Follow instructions from your health care provider about what to eat or drink after your procedure.  Return to your normal activities as told by  your health care provider. Ask your health care provider what activities are safe for you.  Take over-the-counter and prescription medicines only as told by your health care provider.  Do not drive for 24 hours if you were given a sedative during your procedure.  Keep all follow-up visits as told by your health care provider. This is important. Contact a health care provider if you have:  A sore throat that lasts longer than one day.  Trouble swallowing. Get help right away if:  You vomit blood or your vomit looks like coffee grounds.  You have: ? A fever. ? Bloody, black, or tarry stools. ? A severe sore throat or you cannot swallow. ? Difficulty breathing. ? Severe pain in your chest or abdomen. Summary  After the procedure, it is common to have a sore throat, mild stomach discomfort, bloating, and nausea.  Do not drive for 24 hours if you were given a sedative during the procedure.  Follow instructions from your health care provider about what to eat or drink after your procedure.  Return to your normal activities as told by your health care provider. This information is not intended to replace advice given to you by your health care provider. Make sure you discuss any questions you have with your health care provider. Document Revised: 03/25/2018 Document Reviewed: 03/03/2018 Elsevier Patient Education  [place].

## 2020-03-31 ENCOUNTER — Other Ambulatory Visit: Payer: Self-pay | Admitting: Obstetrics & Gynecology

## 2020-04-04 ENCOUNTER — Other Ambulatory Visit (HOSPITAL_COMMUNITY)
Admission: RE | Admit: 2020-04-04 | Discharge: 2020-04-04 | Disposition: A | Discharge status: Home / Self Care | Payer: Medicare HMO | Source: Ambulatory Visit | Attending: Internal Medicine | Admitting: Internal Medicine

## 2020-04-04 ENCOUNTER — Telehealth: Payer: Self-pay

## 2020-04-04 ENCOUNTER — Other Ambulatory Visit: Payer: Self-pay

## 2020-04-04 ENCOUNTER — Encounter (HOSPITAL_COMMUNITY)
Admission: RE | Admit: 2020-04-04 | Discharge: 2020-04-04 | Disposition: A | Discharge status: Home / Self Care | Payer: Medicare HMO | Source: Ambulatory Visit | Attending: Internal Medicine | Admitting: Internal Medicine

## 2020-04-04 ENCOUNTER — Encounter (HOSPITAL_COMMUNITY): Payer: Self-pay

## 2020-04-04 DIAGNOSIS — Z20822 Contact with and (suspected) exposure to covid-19: Secondary | ICD-10-CM | POA: Insufficient documentation

## 2020-04-04 DIAGNOSIS — Z01818 Encounter for other preprocedural examination: Secondary | ICD-10-CM | POA: Diagnosis not present

## 2020-04-04 HISTORY — DX: Essential (primary) hypertension: I10

## 2020-04-04 NOTE — Telephone Encounter (Signed)
Pam from [facility] called, pt is there for preop appt. She informed Pam that she could not get her clenpiq rx. Pam asked that we give her miralax instructions and she would go over them with the pt. I have done a letter with miralax instructions.

## 2020-04-05 ENCOUNTER — Other Ambulatory Visit (HOSPITAL_COMMUNITY): Payer: Medicare HMO

## 2020-04-05 LAB — SARS CORONAVIRUS 2 (TAT 6-24 HRS): SARS Coronavirus 2: NEGATIVE

## 2020-04-07 ENCOUNTER — Other Ambulatory Visit: Payer: Self-pay

## 2020-04-07 ENCOUNTER — Encounter (HOSPITAL_COMMUNITY): Payer: Self-pay | Admitting: Internal Medicine

## 2020-04-07 ENCOUNTER — Ambulatory Visit (HOSPITAL_COMMUNITY): Payer: Medicare HMO | Admitting: Anesthesiology

## 2020-04-07 ENCOUNTER — Ambulatory Visit (HOSPITAL_COMMUNITY)
Admission: RE | Admit: 2020-04-07 | Discharge: 2020-04-07 | Disposition: A | Discharge status: Home / Self Care | Payer: Medicare HMO | Attending: Internal Medicine | Admitting: Internal Medicine

## 2020-04-07 ENCOUNTER — Encounter (HOSPITAL_COMMUNITY)
Admission: RE | Disposition: A | Discharge status: Home / Self Care | Payer: Self-pay | Source: Home / Self Care | Attending: Internal Medicine

## 2020-04-07 DIAGNOSIS — D509 Iron deficiency anemia, unspecified: Secondary | ICD-10-CM

## 2020-04-07 DIAGNOSIS — Z8249 Family history of ischemic heart disease and other diseases of the circulatory system: Secondary | ICD-10-CM | POA: Diagnosis not present

## 2020-04-07 DIAGNOSIS — Z79899 Other long term (current) drug therapy: Secondary | ICD-10-CM | POA: Diagnosis not present

## 2020-04-07 DIAGNOSIS — K222 Esophageal obstruction: Secondary | ICD-10-CM | POA: Diagnosis not present

## 2020-04-07 DIAGNOSIS — K760 Fatty (change of) liver, not elsewhere classified: Secondary | ICD-10-CM | POA: Diagnosis not present

## 2020-04-07 DIAGNOSIS — Z7984 Long term (current) use of oral hypoglycemic drugs: Secondary | ICD-10-CM | POA: Insufficient documentation

## 2020-04-07 DIAGNOSIS — K635 Polyp of colon: Secondary | ICD-10-CM | POA: Diagnosis not present

## 2020-04-07 DIAGNOSIS — K449 Diaphragmatic hernia without obstruction or gangrene: Secondary | ICD-10-CM | POA: Insufficient documentation

## 2020-04-07 DIAGNOSIS — D120 Benign neoplasm of cecum: Secondary | ICD-10-CM | POA: Insufficient documentation

## 2020-04-07 DIAGNOSIS — I10 Essential (primary) hypertension: Secondary | ICD-10-CM | POA: Insufficient documentation

## 2020-04-07 DIAGNOSIS — Z8719 Personal history of other diseases of the digestive system: Secondary | ICD-10-CM | POA: Diagnosis not present

## 2020-04-07 DIAGNOSIS — R1314 Dysphagia, pharyngoesophageal phase: Secondary | ICD-10-CM | POA: Insufficient documentation

## 2020-04-07 DIAGNOSIS — R1310 Dysphagia, unspecified: Secondary | ICD-10-CM | POA: Diagnosis not present

## 2020-04-07 DIAGNOSIS — E785 Hyperlipidemia, unspecified: Secondary | ICD-10-CM | POA: Diagnosis not present

## 2020-04-07 DIAGNOSIS — Z791 Long term (current) use of non-steroidal anti-inflammatories (NSAID): Secondary | ICD-10-CM | POA: Insufficient documentation

## 2020-04-07 DIAGNOSIS — E119 Type 2 diabetes mellitus without complications: Secondary | ICD-10-CM | POA: Insufficient documentation

## 2020-04-07 DIAGNOSIS — E039 Hypothyroidism, unspecified: Secondary | ICD-10-CM | POA: Insufficient documentation

## 2020-04-07 HISTORY — PX: POLYPECTOMY: SHX5525

## 2020-04-07 HISTORY — PX: ESOPHAGEAL DILATION: SHX303

## 2020-04-07 HISTORY — PX: ESOPHAGOGASTRODUODENOSCOPY (EGD) WITH PROPOFOL: SHX5813

## 2020-04-07 HISTORY — PX: COLONOSCOPY WITH PROPOFOL: SHX5780

## 2020-04-07 LAB — GLUCOSE, CAPILLARY
Glucose-Capillary: 132 mg/dL — ABNORMAL HIGH (ref 70–99)
Glucose-Capillary: 130 mg/dL — ABNORMAL HIGH (ref 70–99)

## 2020-04-07 SURGERY — COLONOSCOPY WITH PROPOFOL
Anesthesia: General

## 2020-04-07 MED ORDER — STERILE WATER FOR IRRIGATION IR SOLN
Status: DC | PRN
  Administered 2020-04-07: 200 mL

## 2020-04-07 MED ORDER — PHENYLEPHRINE 40 MCG/ML (10ML) SYRINGE FOR IV PUSH (FOR BLOOD PRESSURE SUPPORT)
PREFILLED_SYRINGE | INTRAVENOUS | Status: AC
  Filled 2020-04-07: qty 10

## 2020-04-07 MED ORDER — LIDOCAINE VISCOUS HCL 2 % MT SOLN
OROMUCOSAL | Status: AC
  Filled 2020-04-07: qty 15

## 2020-04-07 MED ORDER — LIDOCAINE HCL (CARDIAC) PF 100 MG/5ML IV SOSY
PREFILLED_SYRINGE | INTRAVENOUS | Status: DC | PRN
  Administered 2020-04-07: 20 mg via INTRAVENOUS

## 2020-04-07 MED ORDER — GLYCOPYRROLATE 0.2 MG/ML IJ SOLN
0.2000 mg | Freq: Once | INTRAMUSCULAR | Status: AC
  Administered 2020-04-07: 0.2 mg via INTRAVENOUS
  Filled 2020-04-07: qty 1

## 2020-04-07 MED ORDER — LIDOCAINE VISCOUS HCL 2 % MT SOLN
15.0000 mL | Freq: Once | OROMUCOSAL | Status: AC
  Administered 2020-04-07: 15 mL via OROMUCOSAL

## 2020-04-07 MED ORDER — CHLORHEXIDINE GLUCONATE CLOTH 2 % EX PADS: 6.0000 | MEDICATED_PAD | Freq: Once | CUTANEOUS | Status: DC

## 2020-04-07 MED ORDER — PROPOFOL 500 MG/50ML IV EMUL
INTRAVENOUS | Status: DC | PRN
  Administered 2020-04-07: 120 ug/kg/min via INTRAVENOUS

## 2020-04-07 MED ORDER — KETAMINE HCL 50 MG/5ML IJ SOSY
PREFILLED_SYRINGE | INTRAMUSCULAR | Status: AC
  Filled 2020-04-07: qty 5

## 2020-04-07 MED ORDER — LACTATED RINGERS IV SOLN
Freq: Once | INTRAVENOUS | Status: AC
  Administered 2020-04-07: 1000 mL via INTRAVENOUS

## 2020-04-07 MED ORDER — KETAMINE HCL 10 MG/ML IJ SOLN
INTRAMUSCULAR | Status: DC | PRN
  Administered 2020-04-07 (×2): 10 mg via INTRAVENOUS

## 2020-04-07 MED ORDER — LIDOCAINE 2% (20 MG/ML) 5 ML SYRINGE
INTRAMUSCULAR | Status: AC
  Filled 2020-04-07: qty 5

## 2020-04-07 NOTE — Op Note (Signed)
[facility] Patient Name: Nicole Lam Procedure Date: 04/07/2020 10:11 AM MRN: [id number] Date of Birth: 1950-10-31 Attending MD: Norvel Richards , MD CSN: [id number] Age: 69 Admit Type: Outpatient Procedure:                Colonoscopy Indications:              Iron deficiency anemia Providers:                Norvel Richards, MD, Rosina Lowenstein, RN, Crystal                            Page, Nelma Rothman, Technician Referring MD:              Medicines:                Propofol per Anesthesia Complications:            No immediate complications. Estimated Blood Loss:     Estimated blood loss was minimal. Procedure:                Pre-Anesthesia Assessment:                           - Prior to the procedure, a History and Physical                            was performed, and patient medications and                            allergies were reviewed. The patient's tolerance of                            previous anesthesia was also reviewed. The risks                            and benefits of the procedure and the sedation                            options and risks were discussed with the patient.                            All questions were answered, and informed consent                            was obtained. Prior Anticoagulants: The patient has                            taken no previous anticoagulant or antiplatelet                            agents. ASA Grade Assessment: II - A patient with                            mild systemic disease. After reviewing the risks  and benefits, the patient was deemed in                            satisfactory condition to undergo the procedure.                           After obtaining informed consent, the colonoscope                            was passed under direct vision. Throughout the                            procedure, the patient's blood pressure, pulse, and                            oxygen  saturations were monitored continuously. The                            CF-[id number] ([id number]) scope was introduced through                            the anus and advanced to the 5 cm into the ileum.                            The colonoscopy was performed without difficulty.                            The patient tolerated the procedure well. The                            quality of the bowel preparation was adequate. The                            terminal ileum, ileocecal valve, appendiceal                            orifice, and rectum were photographed. The entire                            colon was well visualized. Scope In: 10:15:42 AM Scope Out: 10:31:52 AM Scope Withdrawal Time: 0 hours 11 minutes 45 seconds  Total Procedure Duration: 0 hours 16 minutes 10 seconds  Findings:      The perianal and digital rectal examinations were normal.      A 4 mm polyp was found in the ileocecal valve. The polyp was sessile.       The polyp was removed with a cold snare. Resection and retrieval were       complete. Estimated blood loss was minimal. Distal 5 cm of terminal       ileum appeared normal.      The exam was otherwise without abnormality on direct view. Rectal vault       too small to retroflex. Rectal mucosa seen well on?"face.. Impression:               - One 4 mm polyp  at the ileocecal valve, removed                            with a cold snare. Resected and retrieved.                           - The examination was otherwise normal on direct                            and retroflexion views. No explanation for iron                            deficiency anemia based on today's EGD or                            colonoscopy findings. Patient is Hemoccult negative. Moderate Sedation:      Moderate (conscious) sedation was personally administered by an       anesthesia professional. The following parameters were monitored: oxygen       saturation, heart rate, blood pressure,  respiratory rate, EKG, adequacy       of pulmonary ventilation, and response to care. Recommendation:           - Patient has a contact number available for                            emergencies. The signs and symptoms of potential                            delayed complications were discussed with the                            patient. Return to normal activities tomorrow.                            Written discharge instructions were provided to the                            patient.                           - Advance diet as tolerated. CBC and office visit                            in 1 month. May or may not need further GI                            evaluation of iron deficiency anemia. See EGD                            report. Procedure Code(s):        --- Professional ---                           [phone], Colonoscopy, flexible; with removal of  tumor(s), polyp(s), or other lesion(s) by snare                            technique Diagnosis Code(s):        --- Professional ---                           K63.5, Polyp of colon                           D50.9, Iron deficiency anemia, unspecified CPT copyright 2019 American Medical Association. All rights reserved. The codes documented in this report are preliminary and upon coder review may  be revised to meet current compliance requirements. Cristopher Estimable. Princetta Uplinger, MD Norvel Richards, MD 04/07/2020 10:44:40 AM This report has been signed electronically. Number of Addenda: 0

## 2020-04-07 NOTE — Discharge Instructions (Signed)
EGD Discharge instructions Please read the instructions outlined below and refer to this sheet in the next few weeks. These discharge instructions provide you with general information on caring for yourself after you leave the hospital. Your doctor may also give you specific instructions. While your treatment has been planned according to the most current medical practices available, unavoidable complications occasionally occur. If you have any problems or questions after discharge, please call your doctor. ACTIVITY  You may resume your regular activity but move at a slower pace for the next 24 hours.   Take frequent rest periods for the next 24 hours.   Walking will help expel (get rid of) the air and reduce the bloated feeling in your abdomen.   No driving for 24 hours (because of the anesthesia (medicine) used during the test).   You may shower.   Do not sign any important legal documents or operate any machinery for 24 hours (because of the anesthesia used during the test).  NUTRITION  Drink plenty of fluids.   You may resume your normal diet.   Begin with a light meal and progress to your normal diet.   Avoid alcoholic beverages for 24 hours or as instructed by your caregiver.  MEDICATIONS  You may resume your normal medications unless your caregiver tells you otherwise.  WHAT YOU CAN EXPECT TODAY  You may experience abdominal discomfort such as a feeling of fullness or "gas" pains.  FOLLOW-UP  Your doctor will discuss the results of your test with you.  SEEK IMMEDIATE MEDICAL ATTENTION IF ANY OF THE FOLLOWING OCCUR:  Excessive nausea (feeling sick to your stomach) and/or vomiting.   Severe abdominal pain and distention (swelling).   Trouble swallowing.   Temperature over 101 F (37.8 C).   Rectal bleeding or vomiting of blood.     Colonoscopy Discharge Instructions  Read the instructions outlined below and refer to this sheet in the next few weeks. These  discharge instructions provide you with general information on caring for yourself after you leave the hospital. Your doctor may also give you specific instructions. While your treatment has been planned according to the most current medical practices available, unavoidable complications occasionally occur. If you have any problems or questions after discharge, call Dr. Gala Romney at [phone]. ACTIVITY  You may resume your regular activity, but move at a slower pace for the next 24 hours.   Take frequent rest periods for the next 24 hours.   Walking will help get rid of the air and reduce the bloated feeling in your belly (abdomen).   No driving for 24 hours (because of the medicine (anesthesia) used during the test).    Do not sign any important legal documents or operate any machinery for 24 hours (because of the anesthesia used during the test).  NUTRITION  Drink plenty of fluids.   You may resume your normal diet as instructed by your doctor.   Begin with a light meal and progress to your normal diet. Heavy or fried foods are harder to digest and may make you feel sick to your stomach (nauseated).   Avoid alcoholic beverages for 24 hours or as instructed.  MEDICATIONS  You may resume your normal medications unless your doctor tells you otherwise.  WHAT YOU CAN EXPECT TODAY  Some feelings of bloating in the abdomen.   Passage of more gas than usual.   Spotting of blood in your stool or on the toilet paper.  IF YOU HAD POLYPS REMOVED DURING  THE COLONOSCOPY:  No aspirin products for 7 days or as instructed.   No alcohol for 7 days or as instructed.   Eat a soft diet for the next 24 hours.  FINDING OUT THE RESULTS OF YOUR TEST Not all test results are available during your visit. If your test results are not back during the visit, make an appointment with your caregiver to find out the results. Do not assume everything is normal if you have not heard from your caregiver or the  medical facility. It is important for you to follow up on all of your test results.  SEEK IMMEDIATE MEDICAL ATTENTION IF:  You have more than a spotting of blood in your stool.   Your belly is swollen (abdominal distention).   You are nauseated or vomiting.   You have a temperature over 101.   You have abdominal pain or discomfort that is severe or gets worse throughout the day.   Colon polyp and diverticulosis information provided  Esophagus was dilated today.  This needed to be done.  You did have a narrowing.  1 polyp removed from your colon today  Office visit with [place] in 1 month  CBC just before next office visit  Further recommendations to follow pending review of pathology report  At patient request, I called Daisey Must at [phone] and reviewed results.  You may or may not need further GI evaluation of your iron deficiency anemia.  PATIENT INSTRUCTIONS POST-ANESTHESIA  IMMEDIATELY FOLLOWING SURGERY:  Do not drive or operate machinery for the first twenty four hours after surgery.  Do not make any important decisions for twenty four hours after surgery or while taking narcotic pain medications or sedatives.  If you develop intractable nausea and vomiting or a severe headache please notify your doctor immediately.  FOLLOW-UP:  Please make an appointment with your surgeon as instructed. You do not need to follow up with anesthesia unless specifically instructed to do so.  WOUND CARE INSTRUCTIONS (if applicable):  Keep a dry clean dressing on the anesthesia/puncture wound site if there is drainage.  Once the wound has quit draining you may leave it open to air.  Generally you should leave the bandage intact for twenty four hours unless there is drainage.  If the epidural site drains for more than 36-48 hours please call the anesthesia department.  QUESTIONS?:  Please feel free to call your physician or the hospital operator if you have any questions, and they will be  happy to assist you.      Colon Polyps  Polyps are tissue growths inside the body. Polyps can grow in many places, including the large intestine (colon). A polyp may be a round bump or a mushroom-shaped growth. You could have one polyp or several. Most colon polyps are noncancerous (benign). However, some colon polyps can become cancerous over time. Finding and removing the polyps early can help prevent this. What are the causes? The exact cause of colon polyps is not known. What increases the risk? You are more likely to develop this condition if you:  Have a family history of colon cancer or colon polyps.  Are older than 31 or older than 45 if you are African American.  Have inflammatory bowel disease, such as ulcerative colitis or Crohn's disease.  Have certain hereditary conditions, such as: ? Familial adenomatous polyposis. ? Lynch syndrome. ? Turcot syndrome. ? Peutz-Jeghers syndrome.  Are overweight.  Smoke cigarettes.  Do not get enough exercise.  Drink too much  alcohol.  Eat a diet that is high in fat and red meat and low in fiber.  Had childhood cancer that was treated with abdominal radiation. What are the signs or symptoms? Most polyps do not cause symptoms. If you have symptoms, they may include:  Blood coming from your rectum when having a bowel movement.  Blood in your stool. The stool may look dark red or black.  Abdominal pain.  A change in bowel habits, such as constipation or diarrhea. How is this diagnosed? This condition is diagnosed with a colonoscopy. This is a procedure in which a lighted, flexible scope is inserted into the anus and then passed into the colon to examine the area. Polyps are sometimes found when a colonoscopy is done as part of routine cancer screening tests. How is this treated? Treatment for this condition involves removing any polyps that are found. Most polyps can be removed during a colonoscopy. Those polyps will then be  tested for cancer. Additional treatment may be needed depending on the results of testing. Follow these instructions at home: Lifestyle  Maintain a healthy weight, or lose weight if recommended by your health care provider.  Exercise every day or as told by your health care provider.  Do not use any products that contain nicotine or tobacco, such as cigarettes and e-cigarettes. If you need help quitting, ask your health care provider.  If you drink alcohol, limit how much you have: ? 0-1 drink a day for women. ? 0-2 drinks a day for men.  Be aware of how much alcohol is in your drink. In the [place] one drink equals one 12 oz bottle of beer (355 mL), one 5 oz glass of wine (148 mL), or one 1 oz shot of hard liquor (44 mL). Eating and drinking   Eat foods that are high in fiber, such as fruits, vegetables, and whole grains.  Eat foods that are high in calcium and vitamin D, such as milk, cheese, yogurt, eggs, liver, fish, and broccoli.  Limit foods that are high in fat, such as fried foods and desserts.  Limit the amount of red meat and processed meat you eat, such as hot dogs, sausage, bacon, and lunch meats. General instructions  Keep all follow-up visits as told by your health care provider. This is important. ? This includes having regularly scheduled colonoscopies. ? Talk to your health care provider about when you need a colonoscopy. Contact a health care provider if:  You have new or worsening bleeding during a bowel movement.  You have new or increased blood in your stool.  You have a change in bowel habits.  You lose weight for no known reason. Summary  Polyps are tissue growths inside the body. Polyps can grow in many places, including the colon.  Most colon polyps are noncancerous (benign), but some can become cancerous over time.  This condition is diagnosed with a colonoscopy.  Treatment for this condition involves removing any polyps that are found. Most  polyps can be removed during a colonoscopy. This information is not intended to replace advice given to you by your health care provider. Make sure you discuss any questions you have with your health care provider. Document Revised: 01/16/2018 Document Reviewed: 01/16/2018 Elsevier Patient Education  [place].   Diverticulosis  Diverticulosis is a condition that develops when small pouches (diverticula) form in the wall of the large intestine (colon). The colon is where water is absorbed and stool (feces) is formed. The pouches  form when the inside layer of the colon pushes through weak spots in the outer layers of the colon. You may have a few pouches or many of them. The pouches usually do not cause problems unless they become inflamed or infected. When this happens, the condition is called diverticulitis. What are the causes? The cause of this condition is not known. What increases the risk? The following factors may make you more likely to develop this condition:  Being older than age 21. Your risk for this condition increases with age. Diverticulosis is rare among people younger than age 65. By age 58, many people have it.  Eating a low-fiber diet.  Having frequent constipation.  Being overweight.  Not getting enough exercise.  Smoking.  Taking over-the-counter pain medicines, like aspirin and ibuprofen.  Having a family history of diverticulosis. What are the signs or symptoms? In most people, there are no symptoms of this condition. If you do have symptoms, they may include:  Bloating.  Cramps in the abdomen.  Constipation or diarrhea.  Pain in the lower left side of the abdomen. How is this diagnosed? Because diverticulosis usually has no symptoms, it is most often diagnosed during an exam for other colon problems. The condition may be diagnosed by:  Using a flexible scope to examine the colon (colonoscopy).  Taking an X-ray of the colon after dye has  been put into the colon (barium enema).  Having a CT scan. How is this treated? You may not need treatment for this condition. Your health care provider may recommend treatment to prevent problems. You may need treatment if you have symptoms or if you previously had diverticulitis. Treatment may include:  Eating a high-fiber diet.  Taking a fiber supplement.  Taking a live bacteria supplement (probiotic).  Taking medicine to relax your colon. Follow these instructions at home: Medicines  Take over-the-counter and prescription medicines only as told by your health care provider.  If told by your health care provider, take a fiber supplement or probiotic. Constipation prevention Your condition may cause constipation. To prevent or treat constipation, you may need to:  Drink enough fluid to keep your urine pale yellow.  Take over-the-counter or prescription medicines.  Eat foods that are high in fiber, such as beans, whole grains, and fresh fruits and vegetables.  Limit foods that are high in fat and processed sugars, such as fried or sweet foods.  General instructions  Try not to strain when you have a bowel movement.  Keep all follow-up visits as told by your health care provider. This is important. Contact a health care provider if you:  Have pain in your abdomen.  Have bloating.  Have cramps.  Have not had a bowel movement in 3 days. Get help right away if:  Your pain gets worse.  Your bloating becomes very bad.  You have a fever or chills, and your symptoms suddenly get worse.  You vomit.  You have bowel movements that are bloody or black.  You have bleeding from your rectum. Summary  Diverticulosis is a condition that develops when small pouches (diverticula) form in the wall of the large intestine (colon).  You may have a few pouches or many of them.  This condition is most often diagnosed during an exam for other colon problems.  Treatment may  include increasing the fiber in your diet, taking supplements, or taking medicines. This information is not intended to replace advice given to you by your health care provider. Make sure  you discuss any questions you have with your health care provider. Document Revised: 04/30/2019 Document Reviewed: 04/30/2019 Elsevier Patient Education  [place].

## 2020-04-07 NOTE — Anesthesia Preprocedure Evaluation (Signed)
Anesthesia Evaluation  Patient identified by MRN, date of birth, ID band Patient awake    Reviewed: Allergy & Precautions, NPO status , Patient's Chart, lab work & pertinent test results  History of Anesthesia Complications Negative for: history of anesthetic complications  Airway Mallampati: III  TM Distance: >3 FB Neck ROM: Full    Dental  (+) Missing, Dental Advisory Given   Pulmonary neg pulmonary ROS,    Pulmonary exam normal breath sounds clear to auscultation       Cardiovascular Exercise Tolerance: Good METS: 3 - Mets hypertension, Pt. on medications Normal cardiovascular exam Rhythm:Regular Rate:Normal     Neuro/Psych  Neuromuscular disease negative psych ROS   GI/Hepatic GERD  ,(+) Cirrhosis: fatty liver.      ,   Endo/Other  diabetesHypothyroidism   Renal/GU negative Renal ROS     Musculoskeletal  (+) Arthritis  (chronic back pain),   Abdominal   Peds  Hematology  (+) anemia ,   Anesthesia Other Findings   Reproductive/Obstetrics                            Anesthesia Physical Anesthesia Plan  ASA: II  Anesthesia Plan: General   Post-op Pain Management:    Induction: Intravenous  PONV Risk Score and Plan: 2 and TIVA  Airway Management Planned: Nasal Cannula, Natural Airway and Simple Face Mask  Additional Equipment:   Intra-op Plan:   Post-operative Plan:   Informed Consent: I have reviewed the patients History and Physical, chart, labs and discussed the procedure including the risks, benefits and alternatives for the proposed anesthesia with the patient or authorized representative who has indicated his/her understanding and acceptance.     Dental advisory given  Plan Discussed with: CRNA and Surgeon  Anesthesia Plan Comments:         Anesthesia Quick Evaluation

## 2020-04-07 NOTE — H&P (Signed)
@LOGO @   Primary Care Physician:  Kathyrn Drown, MD Primary Gastroenterologist:  Dr. Gala Romney  Pre-Procedure History & Physical: HPI:  Nicole Lam is a 69 y.o. female here for further evaluation of iron deficiency anemia via EGD and colonoscopy.  Vague intermittent esophageal dysphagia.  Solids few and far between.  Past Medical History:  Diagnosis Date  . Diabetes ([place])   . Fatty liver   . Hemorrhoid   . Hyperlipidemia   . Hypertension   . Hypothyroidism   . Lipoma of neck     Past Surgical History:  Procedure Laterality Date  . ABDOMINAL HYSTERECTOMY    . APPENDECTOMY    . COLONOSCOPY  05/30/05   OIB:BCWUGQ anal papilla, otherwise normal rectum.and colon  . COLONOSCOPY N/A 11/04/2015   RMR: normal colonoscopy  . DILATION AND CURETTAGE OF UTERUS      Prior to Admission medications   Medication Sig Start Date End Date Taking? Authorizing Provider  atorvastatin (LIPITOR) 20 MG tablet Take 1 tablet (20 mg total) by mouth daily. 01/29/20  Yes Kathyrn Drown, MD  cetirizine (ZYRTEC) 10 MG tablet Take 10 mg by mouth daily.   Yes [provider]  diclofenac sodium (VOLTAREN) 1 % GEL Apply 4 g topically 4 (four) times daily. Patient taking differently: Apply 4 g topically 2 (two) times daily as needed (pain).  10/28/17  Yes Carole Civil, MD  estradiol (ESTRACE) 0.5 MG tablet TAKE 1 TABLET EVERY DAY 04/01/20  Yes Florian Buff, MD  glipiZIDE (GLUCOTROL XL) 2.5 MG 24 hr tablet Take 1 tablet (2.5 mg total) by mouth daily with breakfast. 01/29/20  Yes Luking, Elayne Snare, MD  HYDROcodone-acetaminophen (NORCO) 7.5-325 MG tablet 1 q4 hours prn Patient taking differently: Take 0.5 tablets by mouth every 4 (four) hours as needed.  01/29/20  Yes Kathyrn Drown, MD  levothyroxine (SYNTHROID) 88 MCG tablet Take 1 tablet (88 mcg total) by mouth daily. Patient taking differently: Take 44-88 mcg by mouth See admin instructions. 44 mcg Mon and Fri, 88 mcg all other days 01/29/20  Yes  Luking, Elayne Snare, MD  losartan (COZAAR) 50 MG tablet Take 1 tablet (50 mg total) by mouth daily. 01/29/20  Yes Luking, Elayne Snare, MD  metFORMIN (GLUCOPHAGE) 500 MG tablet TAKE 2 TABLETS IN THE MORNING AND 2 TABLETS IN THE EVENING Patient taking differently: Take 1,000 mg by mouth 2 (two) times daily with a meal. TAKE 2 TABLETS IN THE MORNING AND 2 TABLETS IN THE EVENING 01/29/20  Yes Luking, Elayne Snare, MD  Multiple Vitamins-Minerals (MULTIVITAMIN WITH MINERALS) tablet Take 1 tablet by mouth daily.   Yes [provider]  OVER THE COUNTER MEDICATION Take 1 capsule by mouth in the morning and at bedtime. Blood sugar Blaster   Yes [provider]  pantoprazole (PROTONIX) 40 MG tablet Take 1 tablet (40 mg total) by mouth daily. 01/29/20  Yes Kathyrn Drown, MD  phenylephrine-shark liver oil-mineral oil-petrolatum (PREPARATION H) 0.25-3-14-71.9 % rectal ointment Place 1 application rectally 2 (two) times daily as needed for hemorrhoids.   Yes [provider]  polyethylene glycol (MIRALAX / GLYCOLAX) packet Take 17 g by mouth daily.    Yes [provider]  acyclovir (ZOVIRAX) 400 MG tablet TAKE 1 TABLET BY MOUTH 5 TIMES A DAY Patient not taking: No sig reported 07/06/19   Florian Buff, MD    Allergies as of 01/07/2020  . (No Known Allergies)    Family History  Problem Relation  Age of Onset  . Heart disease Mother   . Hypertension Mother   . Birth defects Daughter   . Colon cancer Neg Hx   . Liver disease Neg Hx   . Colon polyps Neg Hx     Social History   Socioeconomic History  . Marital status: Married    Spouse name: Not on file  . Number of children: Not on file  . Years of education: Not on file  . Highest education level: Not on file  Occupational History  . Not on file  Tobacco Use  . Smoking status: Never Smoker  . Smokeless tobacco: Never Used  Substance and Sexual Activity  . Alcohol use: No    Alcohol/week: 0.0 standard drinks  . Drug use:  No  . Sexual activity: Yes    Birth control/protection: Surgical    Comment: hyst  Other Topics Concern  . Not on file  Social History Narrative  . Not on file   Social Determinants of Health   Financial Resource Strain:   . Difficulty of Paying Living Expenses:   Food Insecurity:   . Worried About Charity fundraiser in the Last Year:   . Arboriculturist in the Last Year:   Transportation Needs:   . Film/video editor (Medical):   Marland Kitchen Lack of Transportation (Non-Medical):   Physical Activity:   . Days of Exercise per Week:   . Minutes of Exercise per Session:   Stress:   . Feeling of Stress :   Social Connections:   . Frequency of Communication with Friends and Family:   . Frequency of Social Gatherings with Friends and Family:   . Attends Religious Services:   . Active Member of Clubs or Organizations:   . Attends Archivist Meetings:   Marland Kitchen Marital Status:   Intimate Partner Violence:   . Fear of Current or Ex-Partner:   . Emotionally Abused:   Marland Kitchen Physically Abused:   . Sexually Abused:     Review of Systems: See HPI, otherwise negative ROS  Physical Exam: BP (!) 169/66   Pulse 74   Temp 98.1 F (36.7 C) (Oral)   Resp 18   Ht 4' 11.5" (1.511 m)   Wt 58.5 kg   SpO2 100%   BMI 25.61 kg/m  General:   Alert,  Well-developed, well-nourished, pleasant and cooperative in NAD Neck:  Supple; no masses or thyromegaly. No significant cervical adenopathy. Lungs:  Clear throughout to auscultation.   No wheezes, crackles, or rhonchi. No acute distress. Heart:  Regular rate and rhythm; no murmurs, clicks, rubs,  or gallops. Abdomen: Non-distended, normal bowel sounds.  Soft and nontender without appreciable mass or hepatosplenomegaly.  Pulses:  Normal pulses noted. Extremities:  Without clubbing or edema.  Impression/Plan: 69 year old lady with new onset iron deficiency anemia.  Vague intermittent esophageal dysphagia.  Longstanding GERD-controlled.  EGD with  possible esophageal dilation as feasible/appropriate per plan and diagnostic colonoscopy today.  The risks, benefits, limitations, imponderables and alternatives regarding both EGD and colonoscopy have been reviewed with the patient. Questions have been answered. All parties agreeable.      Notice: This dictation was prepared with Dragon dictation along with smaller phrase technology. Any transcriptional errors that result from this process are unintentional and may not be corrected upon review.

## 2020-04-07 NOTE — Transfer of Care (Signed)
Immediate Anesthesia Transfer of Care Note  Patient: FARRIE SANN  Procedure(s) Performed: COLONOSCOPY WITH PROPOFOL (N/A ) ESOPHAGOGASTRODUODENOSCOPY (EGD) WITH PROPOFOL (N/A ) ESOPHAGEAL DILATION POLYPECTOMY  Patient Location: PACU  Anesthesia Type:MAC and General  Level of Consciousness: drowsy and patient cooperative  Airway & Oxygen Therapy: Patient Spontanous Breathing  Post-op Assessment: Report given to RN and Post -op Vital signs reviewed and stable  Post vital signs: Reviewed and stable  Last Vitals:  Vitals Value Taken Time  BP    Temp    Pulse 78 04/07/20 1043  Resp    SpO2 99 % 04/07/20 1043  Vitals shown include unvalidated device data.  Last Pain:  Vitals:   04/07/20 1032  TempSrc:   PainSc: 0-No pain         Complications: No complications documented.

## 2020-04-07 NOTE — Op Note (Signed)
[facility] Patient Name: Nicole Lam Procedure Date: 04/07/2020 9:41 AM MRN: [id number] Date of Birth: 08-27-1951 Attending MD: Norvel Richards , MD CSN: [id number] Age: 69 Admit Type: Outpatient Procedure:                Upper GI endoscopy Indications:              Iron deficiency anemia, Dysphagia Providers:                Norvel Richards, MD, Crystal Page, Rosina Lowenstein, RN, Nelma Rothman, Technician Referring MD:              Medicines:                Propofol per Anesthesia Complications:            No immediate complications. Estimated Blood Loss:     Estimated blood loss was minimal. Procedure:                Pre-Anesthesia Assessment:                           - Prior to the procedure, a History and Physical                            was performed, and patient medications and                            allergies were reviewed. The patient's tolerance of                            previous anesthesia was also reviewed. The risks                            and benefits of the procedure and the sedation                            options and risks were discussed with the patient.                            All questions were answered, and informed consent                            was obtained. Prior Anticoagulants: The patient has                            taken no previous anticoagulant or antiplatelet                            agents. ASA Grade Assessment: II - A patient with                            mild systemic disease. After reviewing the risks  and benefits, the patient was deemed in                            satisfactory condition to undergo the procedure.                           After obtaining informed consent, the endoscope was                            passed under direct vision. Throughout the                            procedure, the patient's blood pressure, pulse, and                             oxygen saturations were monitored continuously. The                            GIF-H190 ([id number]) scope was introduced through the                            mouth, and advanced to the second part of duodenum.                            The upper GI endoscopy was accomplished without                            difficulty. The patient tolerated the procedure                            well. Scope In: 10:01:35 AM Scope Out: 10:08:19 AM Total Procedure Duration: 0 hours 6 minutes 44 seconds  Findings:      A mild Schatzki ring was found at the gastroesophageal junction.      A small hiatal hernia was present.      The exam was otherwise without abnormality.      The duodenal bulb and second portion of the duodenum were normal. The       scope was withdrawn. Dilation was performed with a Maloney dilator with       mild resistance at 6 Fr. The dilation site was examined following       endoscope reinsertion and showed mild mucosal disruption. Estimated       blood loss was minimal. Partial dilation resulted from [facility] dilation.       Subsequently, four-quadrant "bites" with the biopsy forceps taken to       additionally disrupt the ring. This was done without difficulty or       apparent complication. Impression:               - Mild Schatzki ring. Dilated /disrupted.                           - Small hiatal hernia.                           - The examination was otherwise normal.                           -  Normal duodenal bulb and second portion of the                            duodenum.                           - No specimens collected. Moderate Sedation:      Moderate (conscious) sedation was personally administered by an       anesthesia professional. The following parameters were monitored: oxygen       saturation, heart rate, blood pressure, respiratory rate, EKG, adequacy       of pulmonary ventilation, and response to care. Recommendation:           - Patient has a  contact number available for                            emergencies. The signs and symptoms of potential                            delayed complications were discussed with the                            patient. Return to normal activities tomorrow.                            Written discharge instructions were provided to the                            patient.                           - Advance diet as tolerated.                           - Continue present medications.                           - Return to GI office in 3 months. See colonoscopy                            report. Procedure Code(s):        --- Professional ---                           [phone], Esophagogastroduodenoscopy, flexible,                            transoral; diagnostic, including collection of                            specimen(s) by brushing or washing, when performed                            (separate procedure)                           93235, Dilation of esophagus, by unguided sound or  bougie, single or multiple passes Diagnosis Code(s):        --- Professional ---                           K22.2, Esophageal obstruction                           K44.9, Diaphragmatic hernia without obstruction or                            gangrene                           D50.9, Iron deficiency anemia, unspecified                           R13.10, Dysphagia, unspecified CPT copyright 2019 American Medical Association. All rights reserved. The codes documented in this report are preliminary and upon coder review may  be revised to meet current compliance requirements. Cristopher Estimable. Benedict Kue, MD Norvel Richards, MD 04/07/2020 10:33:12 AM This report has been signed electronically. Number of Addenda: 0

## 2020-04-07 NOTE — Anesthesia Postprocedure Evaluation (Signed)
Anesthesia Post Note  Patient: ARSENIA GORACKE  Procedure(s) Performed: COLONOSCOPY WITH PROPOFOL (N/A ) ESOPHAGOGASTRODUODENOSCOPY (EGD) WITH PROPOFOL (N/A ) ESOPHAGEAL DILATION POLYPECTOMY  Patient location during evaluation: PACU Anesthesia Type: General and MAC Level of consciousness: awake Pain management: pain level controlled Vital Signs Assessment: post-procedure vital signs reviewed and stable Respiratory status: spontaneous breathing Cardiovascular status: stable Postop Assessment: no apparent nausea or vomiting Anesthetic complications: no   No complications documented.   Last Vitals:  Vitals:   04/07/20 0835  BP: (!) 169/66  Pulse: 74  Resp: 18  Temp: 36.7 C  SpO2: 100%    Last Pain:  Vitals:   04/07/20 1032  TempSrc:   PainSc: 0-No pain                 Everette Rank

## 2020-04-08 ENCOUNTER — Encounter: Payer: Self-pay | Admitting: Internal Medicine

## 2020-04-08 LAB — SURGICAL PATHOLOGY

## 2020-04-11 ENCOUNTER — Other Ambulatory Visit: Payer: Self-pay | Admitting: Emergency Medicine

## 2020-04-11 DIAGNOSIS — D509 Iron deficiency anemia, unspecified: Secondary | ICD-10-CM

## 2020-04-11 NOTE — Addendum Note (Signed)
Addendum  created 04/11/20 1605 by Vista Deck, CRNA   Charge Capture section accepted

## 2020-04-13 ENCOUNTER — Encounter (HOSPITAL_COMMUNITY): Payer: Self-pay | Admitting: Internal Medicine

## 2020-04-15 ENCOUNTER — Other Ambulatory Visit: Payer: Self-pay | Admitting: Obstetrics & Gynecology

## 2020-04-22 ENCOUNTER — Ambulatory Visit: Payer: Medicare HMO | Admitting: Gastroenterology

## 2020-05-25 ENCOUNTER — Telehealth: Payer: Self-pay | Admitting: Family Medicine

## 2020-05-25 NOTE — Telephone Encounter (Signed)
Pharmacy calling asking for rx for True Metrix blood glucose monitor, test strips, lancets, control solution, and alcohol swabs.  [employer] mail order [phone]

## 2020-05-25 NOTE — Telephone Encounter (Signed)
Pt husband states pt does not check sugars. Please advise. Script written out and awaiting signature if agreed. Thank you

## 2020-05-26 NOTE — Telephone Encounter (Signed)
Order was signed thank you

## 2020-05-26 NOTE — Telephone Encounter (Signed)
Script faxed to [facility]. Pt husband is aware

## 2020-05-27 DIAGNOSIS — D509 Iron deficiency anemia, unspecified: Secondary | ICD-10-CM | POA: Diagnosis not present

## 2020-06-01 NOTE — Progress Notes (Signed)
Referring Provider: Kathyrn Drown, MD Primary Care Physician:  Kathyrn Drown, MD Primary GI Physician: Dr. Gala Romney  Chief Complaint  Patient presents with   Follow-up    fu from EGD/TCS    HPI:   Nicole Lam is a 69 y.o. female presenting today for follow-up of iron deficiency anemia s/p EGD/TCS.  Also with history of GERD, constipation, and suspected [name] with mildly elevated LFTs and Metavir F2/F3 in 2017.  Extensive serologies in the past including hepatitis B, hepatitis C, smooth muscle antibody, mitochondrial antibody, ANA, immunoglobulins, ceruloplasmin, TTG IgA all negative. Ultrasound completed on 04/24/2019 with evidence of hepatic steatosis. Favored yearly ultrasound due to risk of fibrosis progression. Most recent HFP March 2021 with ALT 37, AST 45 (stable).   She was last seen in our office 01/07/2020 due to new onset microcytic anemia with Hgb 10.5 with microcytic indices, ferritin 10. Previously Hgb 11.6 two years ago. Hemoccult positive.  Bowels moving well with MiraLAX daily.  No overt GI bleeding.  Protonix was working well for GERD.  Reported she used to take [facility] powders for headaches.  Aspirin and meloxicam were stopped when she was found to have anemia.  Plan for TCS/EGD, continue Protonix, avoid NSAIDs.  Procedure 04/07/2020: Colonoscopy: One 4 mm polyp at ileocecal valve, otherwise normal exam.  Patient was heme-negative.  Pathology with tubular adenoma.  No recommendations to repeat colonoscopy. EGD: Small Schatzki's ring s/p dilated, small hiatal hernia, otherwise normal exam. Recommended CBC and office visit in 1 month.  May or may not need additional GI evaluation for IDA.  Today: States she had labs completed at [facility] last Friday, but they have not resulted in the system.   IDA: She is concerned about being able to swallow the given's Capsule. She is open to trying. No blood in the stool or black stool. Not taking an iron supplement. No NSAIDs. No  abdominal pain. BMs daily with MiraLAX daily. Soft and formed BMs.    GERD: Well controlled on Protonix 40 mg daily.   [name]: No swelling in LE or abdomen, confusion, yellowing of her eyes or skin. Notes she will bruise easily. Weight is stable. No alcohol. Mindful not to consume more than 2000 mg tylenol daily.   Dysphagia: No food dysphagia. Never had any significant trouble with foods. She had a few occurrence of this, but only about 4 times in her lifetime, so it is hard to say if esophageal dilation helped. Continues to have some trouble with larger pills.   Past Medical History:  Diagnosis Date   Diabetes ([place])    Fatty liver    Hemorrhoid    Hyperlipidemia    Hypertension    Hypothyroidism    Lipoma of neck     Past Surgical History:  Procedure Laterality Date   ABDOMINAL HYSTERECTOMY     APPENDECTOMY     COLONOSCOPY  05/30/05   TXM:IWOEHO anal papilla, otherwise normal rectum.and colon   COLONOSCOPY N/A 11/04/2015   RMR: normal colonoscopy   COLONOSCOPY WITH PROPOFOL N/A 04/07/2020   Procedure: COLONOSCOPY WITH PROPOFOL;  Surgeon: Daneil Dolin, MD; One 4 mm polyp at ileocecal valve, otherwise normal exam.  Patient was heme-negative.  Pathology with tubular adenoma.  No recommendations to repeat colonoscopy.   DILATION AND CURETTAGE OF UTERUS     ESOPHAGEAL DILATION  04/07/2020   Procedure: ESOPHAGEAL DILATION;  Surgeon: Daneil Dolin, MD;  Location: [facility] SU[facility];  Service: Endoscopy;;   ESOPHAGOGASTRODUODENOSCOPY (EGD)  WITH PROPOFOL N/A 04/07/2020   Procedure: ESOPHAGOGASTRODUODENOSCOPY (EGD) WITH PROPOFOL;  Surgeon: Daneil Dolin, MD;  Small Schatzki's ring s/p dilated, small hiatal hernia, otherwise normal exam.   POLYPECTOMY  04/07/2020   Procedure: POLYPECTOMY;  Surgeon: Daneil Dolin, MD;  Location: [facility] SU[facility];  Service: Endoscopy;;    Current Outpatient Medications  Medication Sig Dispense Refill   acyclovir (ZOVIRAX) 400 MG tablet TAKE 1  TABLET BY MOUTH FIVE TIMES DAILY (Patient taking differently: as needed. ) 75 tablet 0   atorvastatin (LIPITOR) 20 MG tablet Take 1 tablet (20 mg total) by mouth daily. 90 tablet 1   cetirizine (ZYRTEC) 10 MG tablet Take 10 mg by mouth daily.     diclofenac sodium (VOLTAREN) 1 % GEL Apply 4 g topically 4 (four) times daily. (Patient taking differently: Apply 4 g topically 2 (two) times daily as needed (pain). ) 5 Tube 5   ELDERBERRY PO Take by mouth daily.     estradiol (ESTRACE) 0.5 MG tablet TAKE 1 TABLET EVERY DAY 90 tablet 11   glipiZIDE (GLUCOTROL XL) 2.5 MG 24 hr tablet Take 1 tablet (2.5 mg total) by mouth daily with breakfast. 90 tablet 1   HYDROcodone-acetaminophen (NORCO) 7.5-325 MG tablet 1 q4 hours prn (Patient taking differently: Take 0.5 tablets by mouth as needed. ) 30 tablet 0   levothyroxine (SYNTHROID) 88 MCG tablet Take 1 tablet (88 mcg total) by mouth daily. (Patient taking differently: Take 44-88 mcg by mouth See admin instructions. 44 mcg Mon and Fri, 88 mcg all other days) 90 tablet 1   losartan (COZAAR) 50 MG tablet Take 1 tablet (50 mg total) by mouth daily. 90 tablet 1   metFORMIN (GLUCOPHAGE) 500 MG tablet TAKE 2 TABLETS IN THE MORNING AND 2 TABLETS IN THE EVENING (Patient taking differently: Take 1,000 mg by mouth 2 (two) times daily with a meal. TAKE 2 TABLETS IN THE MORNING AND 2 TABLETS IN THE EVENING) 360 tablet 1   Multiple Vitamins-Minerals (MULTIVITAMIN WITH MINERALS) tablet Take 1 tablet by mouth daily.     OVER THE COUNTER MEDICATION Take 1 capsule by mouth in the morning and at bedtime. Blood sugar Blaster     pantoprazole (PROTONIX) 40 MG tablet Take 1 tablet (40 mg total) by mouth daily. 90 tablet 1   phenylephrine-shark liver oil-mineral oil-petrolatum (PREPARATION H) 0.25-3-14-71.9 % rectal ointment Place 1 application rectally as needed for hemorrhoids.      polyethylene glycol (MIRALAX / GLYCOLAX) packet Take 17 g by mouth daily.      UNABLE  TO FIND 2 capsules daily. Med Name: [facility]     No current facility-administered medications for this visit.    Allergies as of 06/02/2020   (No Known Allergies)    Family History  Problem Relation Age of Onset   Heart disease Mother    Hypertension Mother    Birth defects Daughter    Colon cancer Neg Hx    Liver disease Neg Hx    Colon polyps Neg Hx     Social History   Socioeconomic History   Marital status: Married    Spouse name: Not on file   Number of children: Not on file   Years of education: Not on file   Highest education level: Not on file  Occupational History   Not on file  Tobacco Use   Smoking status: Never Smoker   Smokeless tobacco: Never Used  Substance and Sexual Activity   Alcohol use: No  Alcohol/week: 0.0 standard drinks   Drug use: No   Sexual activity: Yes    Birth control/protection: Surgical    Comment: hyst  Other Topics Concern   Not on file  Social History Narrative   Not on file   Social Determinants of Health   Financial Resource Strain:    Difficulty of Paying Living Expenses: Not on file  Food Insecurity:    Worried About Charity fundraiser in the Last Year: Not on file   [employer] of Food in the Last Year: Not on file  Transportation Needs:    Lack of Transportation (Medical): Not on file   Lack of Transportation (Non-Medical): Not on file  Physical Activity:    Days of Exercise per Week: Not on file   Minutes of Exercise per Session: Not on file  Stress:    Feeling of Stress : Not on file  Social Connections:    Frequency of Communication with Friends and Family: Not on file   Frequency of Social Gatherings with Friends and Family: Not on file   Attends Religious Services: Not on file   Active Member of Clubs or Organizations: Not on file   Attends Archivist Meetings: Not on file   Marital Status: Not on file    Review of Systems: Gen: Denies fever, chills, cold-like  symptoms, lightheadedness, dizziness, presyncope, syncope. CV: Denies chest pain or palpitations. Resp: Denies dyspnea or cough.  Heme: See HPI  Physical Exam: BP 137/67    Pulse 77    Temp (!) 97.5 F (36.4 C) (Oral)    Ht 5' (1.524 m)    Wt 130 lb 6.4 oz (59.1 kg)    BMI 25.47 kg/m  General:   Alert and oriented. No distress noted. Pleasant and cooperative.  Head:  Normocephalic and atraumatic. Eyes:  Conjuctiva clear without scleral icterus. Heart:  S1, S2 present without murmurs appreciated. Lungs:  Clear to auscultation bilaterally. No wheezes, rales, or rhonchi. No distress.  Abdomen:  +BS, soft, non-tender and non-distended. No rebound or guarding. No HSM or masses noted. Msk:  Symmetrical without gross deformities. Normal posture. Extremities:  Without edema. Neurologic:  Alert and  oriented x4 Psych: Normal mood and affect.

## 2020-06-02 ENCOUNTER — Other Ambulatory Visit: Payer: Self-pay

## 2020-06-02 ENCOUNTER — Ambulatory Visit: Payer: Medicare HMO | Admitting: Gastroenterology

## 2020-06-02 ENCOUNTER — Encounter: Payer: Self-pay | Admitting: Gastroenterology

## 2020-06-02 VITALS — BP 137/67 | HR 77 | Temp 97.5°F | Ht 60.0 in | Wt 130.4 lb

## 2020-06-02 DIAGNOSIS — R945 Abnormal results of liver function studies: Secondary | ICD-10-CM

## 2020-06-02 DIAGNOSIS — K760 Fatty (change of) liver, not elsewhere classified: Secondary | ICD-10-CM | POA: Diagnosis not present

## 2020-06-02 DIAGNOSIS — R1310 Dysphagia, unspecified: Secondary | ICD-10-CM | POA: Insufficient documentation

## 2020-06-02 DIAGNOSIS — D509 Iron deficiency anemia, unspecified: Secondary | ICD-10-CM | POA: Diagnosis not present

## 2020-06-02 DIAGNOSIS — R7989 Other specified abnormal findings of blood chemistry: Secondary | ICD-10-CM

## 2020-06-02 DIAGNOSIS — K219 Gastro-esophageal reflux disease without esophagitis: Secondary | ICD-10-CM | POA: Diagnosis not present

## 2020-06-02 DIAGNOSIS — K7581 Nonalcoholic steatohepatitis (NASH): Secondary | ICD-10-CM

## 2020-06-02 NOTE — Assessment & Plan Note (Addendum)
69 year old female found to have new onset iron deficiency anemia in March 2021 with hemoglobin 10.5, microcytic indices, ferritin 10.  Hemoccult positive.  No overt GI bleeding.  On Protonix daily for GERD.  Admitted to historically taking BC powders as needed for headaches.  She is also on aspirin and meloxicam at the time but all of these medications have been discontinued.  Colonoscopy and EGD 04/07/2020.  Colonoscopy with 1 tubular adenoma, otherwise normal exam.  EGD with small Schatzki's ring, small hiatal hernia, otherwise normal exam.  Patient reports repeating CBC 1 week ago with [facility] but I do not have results.   Suspect we are going to need Givens capsule for complete evaluation of IDA as I have no clear explanation for her declining hemoglobin.  However, we will request lab results from [facility] and make further recommendations at that time.   Plan: Request lab results from [facility].  Further recommendations to follow. Continue Protonix 40 mg daily. Continue to avoid all NSAIDs. She will need to start oral iron in the near future.  Holding off on this for now as she may undergo capsule endoscopy in the very near future. Follow-up in 4 months.

## 2020-06-02 NOTE — Assessment & Plan Note (Signed)
69 year old female with history of fatty liver, suspected [name], with chronic mildly elevated LFTs.  Extensive serologic evaluation unrevealing (see HPI).  Most recent ectopy stable with ALT 37, AST 45.  Elastography 2017 with mild nodular hepatic contour, F2/F3 scores.  She is getting yearly ultrasounds.  Last ultrasound July 2020 with hepatic steatosis.  Platelets remain within normal limits.  EGD June 2021 (for IDA) with no varices.  No signs or symptoms of advanced liver disease.  Plan: Update RUQ abdominal ultrasound. Continue routine monitoring of LFTs.  Labs are completed with PCP. Follow low-fat/low-cholesterol diet. Avoid sweets, sodas, fruit juices, sweetened beverages like tea, etc. Try to exercise daily as she is able. Limit alcohol use. No more than 2000 mg of Tylenol in 24 hours. Follow-up in 4 months due to IDA.

## 2020-06-02 NOTE — Assessment & Plan Note (Signed)
No significant change in mild pill dysphagia s/p EGD with dilation of small Schatzki's ring.  Denies food dysphagia but never had any trouble with this on a regular basis.  She continues to have some trouble swallowing larger pills but otherwise no difficulty swallowing.  We will continue to monitor symptoms for now.

## 2020-06-02 NOTE — Assessment & Plan Note (Signed)
Addressed under fatty liver.  

## 2020-06-02 NOTE — Patient Instructions (Addendum)
We will get you scheduled to have an ultrasound of your liver in the near future.  We are going to request lab results from [facility] and call you with further recommendations.  See handout below with foods that are rich in iron.  Try incorporate this into your diet regularly.  Continue to avoid all NSAID products including ibuprofen, Aleve, Advil, aspirin powders, and anything that says "NSAID" on the package.  Fatty liver: Follow a low-fat/low-cholesterol diet. Avoid sweets, sodas, fruit juices, sweetened beverages like tea, etc. Try to exercise daily as you are able. Limit alcohol use. No more than 2000 mg of Tylenol in 24 hours.  Continue taking Protonix 40 mg daily.  We will plan to follow-up with you in 4 months.  We will call you with lab results and further recommendations.  Aliene Altes, PA-C [facility]

## 2020-06-02 NOTE — Assessment & Plan Note (Signed)
Well-controlled on Protonix 40 mg daily.  Advise she continue her current medications.  Follow-up in 4 months due to iron deficiency anemia.

## 2020-06-03 ENCOUNTER — Telehealth: Payer: Self-pay | Admitting: Internal Medicine

## 2020-06-03 ENCOUNTER — Telehealth: Payer: Self-pay | Admitting: Gastroenterology

## 2020-06-03 DIAGNOSIS — D509 Iron deficiency anemia, unspecified: Secondary | ICD-10-CM

## 2020-06-03 DIAGNOSIS — T189XXA Foreign body of alimentary tract, part unspecified, initial encounter: Secondary | ICD-10-CM

## 2020-06-03 NOTE — Telephone Encounter (Signed)
Yes, we discussed this yesterday. We will proceed with attempting Givens with patency first. If she is not able to swallow the pill, I will have to talk with Dr. Gala Romney about repeat EGD for placement of the capsule.

## 2020-06-03 NOTE — Telephone Encounter (Signed)
Please let patient know I have received and reviewed her labs completed 05/27/20. Her hemoglobin has continued to decline. Hemoglobin is 9.1, down from 10.3 in March.   We need to pursue Given Capsule study to further evaluate her anemia. Recommend patency capsule first.   [facility] Clinical Pool: Please arrange Given Capsule with patency capsule first. Dx: IDA.

## 2020-06-03 NOTE — Progress Notes (Signed)
Cc'ed to pcp °

## 2020-06-03 NOTE — Telephone Encounter (Signed)
Spoke with pts spouse. Pt is currently at work and can't discuss results. Spouse is going to have pt call on her break or Monday to discuss results.

## 2020-06-03 NOTE — Telephone Encounter (Signed)
See other phone note for documentation.

## 2020-06-03 NOTE — Telephone Encounter (Signed)
Pt was returning call. She is at work calling on her break and asked to hold for the nurse.

## 2020-06-03 NOTE — Telephone Encounter (Signed)
Pt returned call. Pt was notified of results and recommendations of Given Capsule Study. FYI Pt says she'll try to do Capsule Study but she isn't able to swallowing medium and large pills.

## 2020-06-06 NOTE — Telephone Encounter (Signed)
Tried to call pt, no answer, LMOAM for return call.

## 2020-06-06 NOTE — Telephone Encounter (Signed)
Called pt, she works Mon-Fri 7:00am-3:30pm. She will talk to her boss and call our office back.

## 2020-06-06 NOTE — Telephone Encounter (Signed)
Spoke to pt, she had questions about cost of Agile and Givens capsules. Gave her phone# to pre-service center to discuss insurance/benefits. She will call office back.

## 2020-06-07 ENCOUNTER — Ambulatory Visit (HOSPITAL_COMMUNITY): Payer: Medicare HMO

## 2020-06-07 NOTE — Telephone Encounter (Signed)
Spoke to pt, Agile capsule scheduled for 06/14/20 at 7:30am. Advised her to arrive at 7:00am. Instructions given on phone and mailed. Orders entered. Abd x-ray ordered.

## 2020-06-07 NOTE — Addendum Note (Signed)
Addended by: Zara Council C on: 06/07/2020 11:24 AM   Modules accepted: Orders

## 2020-06-09 ENCOUNTER — Other Ambulatory Visit: Payer: Self-pay | Admitting: Family Medicine

## 2020-06-10 ENCOUNTER — Ambulatory Visit (HOSPITAL_COMMUNITY)
Admission: RE | Admit: 2020-06-10 | Discharge: 2020-06-10 | Disposition: A | Discharge status: Home / Self Care | Payer: Medicare HMO | Source: Ambulatory Visit | Attending: Gastroenterology | Admitting: Gastroenterology

## 2020-06-10 ENCOUNTER — Other Ambulatory Visit: Payer: Self-pay

## 2020-06-10 ENCOUNTER — Telehealth: Payer: Self-pay | Admitting: Family Medicine

## 2020-06-10 DIAGNOSIS — K7689 Other specified diseases of liver: Secondary | ICD-10-CM | POA: Diagnosis not present

## 2020-06-10 DIAGNOSIS — R945 Abnormal results of liver function studies: Secondary | ICD-10-CM | POA: Diagnosis not present

## 2020-06-10 DIAGNOSIS — K7581 Nonalcoholic steatohepatitis (NASH): Secondary | ICD-10-CM | POA: Diagnosis not present

## 2020-06-10 DIAGNOSIS — R7989 Other specified abnormal findings of blood chemistry: Secondary | ICD-10-CM

## 2020-06-10 NOTE — Telephone Encounter (Signed)
Pt received a glucos meter from [facility] with strips and another one today from [employer] from [employer] no paper work in the box Pt is wanting to know if Dr Nicki Reaper order both of the meters if not what is pt do with the other meter?   Pt call back [phone]

## 2020-06-10 NOTE — Progress Notes (Signed)
[place] is fairly stable. Again with findings suggestive of hepatic steatosis. States there may be some contour nodularity as can be seen in cirrhosis. She had no signs or symptoms of decompensated liver disease at her OV.   I recommend we go ahead and check an INR. Otherwise, she should continue with recommendations made at the time of her office visit. We will plan to follow-up in 4 months. We need to repeat her [place] in 6 months.   Elmo Putt, please arrange INR.  Stacey: Please nic 6 month US abdomen complete. Dx: Elevated LFTs, possible liver nodularity

## 2020-06-11 ENCOUNTER — Other Ambulatory Visit: Payer: Self-pay | Admitting: Family Medicine

## 2020-06-13 ENCOUNTER — Other Ambulatory Visit: Payer: Self-pay

## 2020-06-13 DIAGNOSIS — K7581 Nonalcoholic steatohepatitis (NASH): Secondary | ICD-10-CM

## 2020-06-13 DIAGNOSIS — R7989 Other specified abnormal findings of blood chemistry: Secondary | ICD-10-CM

## 2020-06-14 ENCOUNTER — Other Ambulatory Visit (HOSPITAL_COMMUNITY)
Admission: RE | Admit: 2020-06-14 | Discharge: 2020-06-14 | Disposition: A | Discharge status: Home / Self Care | Payer: Medicare HMO | Source: Ambulatory Visit | Attending: Internal Medicine | Admitting: Internal Medicine

## 2020-06-14 ENCOUNTER — Telehealth: Payer: Self-pay

## 2020-06-14 ENCOUNTER — Encounter (HOSPITAL_COMMUNITY)
Admission: RE | Disposition: A | Discharge status: Home / Self Care | Payer: Self-pay | Source: Home / Self Care | Attending: Internal Medicine

## 2020-06-14 ENCOUNTER — Ambulatory Visit (HOSPITAL_COMMUNITY)
Admission: RE | Admit: 2020-06-14 | Discharge: 2020-06-14 | Disposition: A | Discharge status: Home / Self Care | Payer: Medicare HMO | Attending: Internal Medicine | Admitting: Internal Medicine

## 2020-06-14 DIAGNOSIS — K760 Fatty (change of) liver, not elsewhere classified: Secondary | ICD-10-CM | POA: Insufficient documentation

## 2020-06-14 DIAGNOSIS — Z538 Procedure and treatment not carried out for other reasons: Secondary | ICD-10-CM | POA: Diagnosis not present

## 2020-06-14 DIAGNOSIS — K7581 Nonalcoholic steatohepatitis (NASH): Secondary | ICD-10-CM | POA: Insufficient documentation

## 2020-06-14 DIAGNOSIS — D509 Iron deficiency anemia, unspecified: Secondary | ICD-10-CM | POA: Diagnosis not present

## 2020-06-14 DIAGNOSIS — R945 Abnormal results of liver function studies: Secondary | ICD-10-CM | POA: Insufficient documentation

## 2020-06-14 HISTORY — PX: AGILE CAPSULE: SHX5420

## 2020-06-14 LAB — PROTIME-INR
INR: 1 (ref 0.8–1.2)
Prothrombin Time: 13.2 seconds (ref 11.4–15.2)

## 2020-06-14 SURGERY — AGILE CAPSULE

## 2020-06-14 NOTE — OR Nursing (Signed)
Patient here for an agile capsule. Patient attempted to swallow pill 3-4 times in the standing and sitting position while drinking water and was unable to get the capsule down and keep it down. Patient states she is unable to swallow ibuprofen and vitamins. Dr. Gala Romney notified.

## 2020-06-14 NOTE — Telephone Encounter (Signed)
FYI Received call from Nicole Lam at Endo. Pt wasn't able to complete her Agile test this morning 06/14/20, due to not being able to swallow the pill.

## 2020-06-15 ENCOUNTER — Encounter (HOSPITAL_COMMUNITY): Payer: Self-pay | Admitting: Internal Medicine

## 2020-06-21 NOTE — Telephone Encounter (Signed)
Noted. I will reach out to Dr. Gala Romney to discuss.

## 2020-06-21 NOTE — Telephone Encounter (Signed)
Noted  

## 2020-06-30 NOTE — Telephone Encounter (Signed)
Pt called back.  Informed her that Aliene Altes, PA had discussed her plan of care with Dr. Gala Romney.  Pt informed that we will need to perform an EGD to place the patency capsule. If her bowel is patent, then we will need to repeat the EGD to place the actual GIVENs capsule. This is unfortunate, but we don't really have another way around it. Informed her that we need to take a look at her small bowel as her hemoglobin has continued to decline.   Pt wants to know if she could just go ahead and have the givens capsule instead of having to do both.  Advised her that I would inform Aliene Altes, PA of her concerns.

## 2020-06-30 NOTE — Telephone Encounter (Signed)
Unfortunately, due to prior surgeries including abdominal hysterectomy and appendectomy, there is risk of scar tissue and adhesions which may affect the Givens capsule when it moves through her small bowel.  This is why we complete the patency capsule first to ensure we do not have a capsule get stuck in her small bowel.

## 2020-06-30 NOTE — Telephone Encounter (Signed)
I have discussed with Dr. Gala Romney. We will need to perform an EGD to place the patency capsule. If her bowel is patent, then we will need to repeat the EGD to place the actual GIVENs capsule. This is unfortunate, but we don't really have another way around it. We need to take a look at her small bowel as her hemoglobin has continued to decline. Please let me know if she is agreeable.

## 2020-06-30 NOTE — Telephone Encounter (Signed)
Left a message with pts spouse. Waiting on a return call.

## 2020-06-30 NOTE — Telephone Encounter (Signed)
Lmom, waiting on a return call.  

## 2020-07-01 ENCOUNTER — Telehealth: Payer: Self-pay | Admitting: Internal Medicine

## 2020-07-01 NOTE — Telephone Encounter (Signed)
noted 

## 2020-07-01 NOTE — Telephone Encounter (Signed)
[phone] please call patient Monday about her capsule study after 4:30pm    She also takes lunch at 12:30 and could speak to someone then     Wants to know why they have to place a dummy capsule before the actual pill study.  Said she does not have enough time to take off twice for work

## 2020-07-01 NOTE — Telephone Encounter (Signed)
There is another phone note that will be forwarded to [facility], [place]. Pt was had questions for Aliene Altes, [place]. I left a message for pt to return call to discuss Aliene Altes, [place] response. Will forward message when pt decides to have procedure.

## 2020-07-01 NOTE — Telephone Encounter (Signed)
I don't have pt scheduled for anything

## 2020-07-04 NOTE — Telephone Encounter (Signed)
I spoke with the pt and she is aware that she would have to have the patency and the givens capsule and an EGD to place the capsule. She said there is no way she would be able to take any time off of work to have any of these procedures done. She does not have any "time off" left at work and she will have to go without pay and she will also get points at work and could potentially get fired. She also cannot quarantine for the covid test because she cannot take the time off work. She said if someone wants to pay her while she is out of work she may be able to do it. I advised her that it was not possible for [place] to pay her to have it done. She also said she could do it on a Saturday and I advised her that endo was not available on Saturday.  She said she works from 7-4:30 Monday- Thursday and 7-3:30 on Friday and she cannot be out of work at all. Advised her that I would let the provider know.

## 2020-07-04 NOTE — Telephone Encounter (Signed)
Left message with husband for pt to call us back.  She is at work he said.

## 2020-07-06 NOTE — Telephone Encounter (Signed)
Lab order on file

## 2020-07-06 NOTE — Telephone Encounter (Signed)
Noted. I will talk with Dr. Gala Romney to see if he has any other idea. There really isn't another good test to take a look at her small bowel for sources of bleeding. She does need evaluation as her hemoglobin has continued to decline.   In the mean time, I recommend she start oral iron BID and repeat CBC and iron panel in 8 weeks.

## 2020-07-06 NOTE — Telephone Encounter (Signed)
Pts husband is aware.

## 2020-07-06 NOTE — Addendum Note (Signed)
Addended by: Claudina Lick on: 07/06/2020 05:12 PM   Modules accepted: Orders

## 2020-07-07 NOTE — Telephone Encounter (Signed)
Please let patient know I have discussed her case with Dr. Gala Romney.  We can pursue CT enterography to evaluate for small bowel as she is unable to proceed given capsule at this time.  She should continue with prior recommendations of starting oral iron twice daily and repeating CBC and iron panel in 8 weeks.  [facility] Clinical Pool: If patient is agreeable, please arrange CT enterography A/P. Dx: IDA.

## 2020-07-08 NOTE — Telephone Encounter (Signed)
Spoke with the pts husband, explained all recommendations to him. He said he will talk to the pt and will call us back on Monday.

## 2020-07-11 ENCOUNTER — Telehealth: Payer: Self-pay | Admitting: Internal Medicine

## 2020-07-11 NOTE — Telephone Encounter (Signed)
Called patient and spoke with spouse. He states was it any alternative to the oral contrast she has to drink, like a shot. I advised no they do not give shots for the oral contrast. He will speak with spouse and call back

## 2020-07-11 NOTE — Telephone Encounter (Signed)
Pt's husband called asking for Almyra Free or [employer]. I told him that Burnadette Peter, LPN was off today and Mindy was with another patient. Please call [phone]

## 2020-07-18 ENCOUNTER — Other Ambulatory Visit (HOSPITAL_COMMUNITY): Payer: Self-pay | Admitting: Family Medicine

## 2020-07-18 ENCOUNTER — Other Ambulatory Visit: Payer: Self-pay | Admitting: *Deleted

## 2020-07-18 ENCOUNTER — Encounter: Payer: Self-pay | Admitting: *Deleted

## 2020-07-18 DIAGNOSIS — Z1231 Encounter for screening mammogram for malignant neoplasm of breast: Secondary | ICD-10-CM

## 2020-07-18 DIAGNOSIS — D509 Iron deficiency anemia, unspecified: Secondary | ICD-10-CM

## 2020-07-19 ENCOUNTER — Telehealth: Payer: Self-pay | Admitting: Family Medicine

## 2020-07-19 DIAGNOSIS — E039 Hypothyroidism, unspecified: Secondary | ICD-10-CM

## 2020-07-19 DIAGNOSIS — E119 Type 2 diabetes mellitus without complications: Secondary | ICD-10-CM

## 2020-07-19 DIAGNOSIS — E785 Hyperlipidemia, unspecified: Secondary | ICD-10-CM

## 2020-07-19 DIAGNOSIS — Z79899 Other long term (current) drug therapy: Secondary | ICD-10-CM

## 2020-07-19 DIAGNOSIS — I10 Essential (primary) hypertension: Secondary | ICD-10-CM

## 2020-07-19 NOTE — Addendum Note (Signed)
Addended by: Vicente Males on: 07/19/2020 10:35 AM   Modules accepted: Orders

## 2020-07-19 NOTE — Telephone Encounter (Signed)
Lab orders placed and pt is aware 

## 2020-07-19 NOTE — Telephone Encounter (Signed)
Pt requesting labs for upcoming physical. Last labs completed 12/25/19 Hemoglobin and Hematocrit, Ferritin, and Iron. CBC, TSH, A1C, BMET, Hepatic and Lipid completed on 12/18/19. Please advise. Thank you

## 2020-07-19 NOTE — Telephone Encounter (Signed)
Lipid, liver, metabolic 7, P2A, TSH, free T4, urine ACR Diabetes hypertension hypothyroidism hyperlipidemia

## 2020-07-25 ENCOUNTER — Ambulatory Visit (HOSPITAL_COMMUNITY)
Admission: RE | Admit: 2020-07-25 | Discharge: 2020-07-25 | Disposition: A | Discharge status: Home / Self Care | Payer: Medicare HMO | Source: Ambulatory Visit | Attending: Family Medicine | Admitting: Family Medicine

## 2020-07-25 ENCOUNTER — Other Ambulatory Visit: Payer: Self-pay

## 2020-07-25 DIAGNOSIS — Z1231 Encounter for screening mammogram for malignant neoplasm of breast: Secondary | ICD-10-CM | POA: Insufficient documentation

## 2020-07-27 ENCOUNTER — Other Ambulatory Visit: Payer: Self-pay | Admitting: Family Medicine

## 2020-07-27 NOTE — Telephone Encounter (Signed)
Can we follow-up with patient one more time on this?

## 2020-07-28 NOTE — Telephone Encounter (Signed)
I spoke with the pts husband and he said the pt said she wasn't going to do any test that she has to drink any sort of contrast because she will end up vomiting and she doesn't have enough time built up at work to be able to take a day off. He will discuss it with her again and if she decides she wants to have it done, she will call us.

## 2020-07-29 NOTE — Telephone Encounter (Signed)
Noted. She should have repeat labs completed as planned.

## 2020-08-03 ENCOUNTER — Other Ambulatory Visit: Payer: Self-pay | Admitting: Family Medicine

## 2020-08-05 ENCOUNTER — Other Ambulatory Visit: Payer: Self-pay | Admitting: Gastroenterology

## 2020-08-05 DIAGNOSIS — D509 Iron deficiency anemia, unspecified: Secondary | ICD-10-CM | POA: Diagnosis not present

## 2020-08-05 DIAGNOSIS — I10 Essential (primary) hypertension: Secondary | ICD-10-CM | POA: Diagnosis not present

## 2020-08-05 DIAGNOSIS — E039 Hypothyroidism, unspecified: Secondary | ICD-10-CM | POA: Diagnosis not present

## 2020-08-05 DIAGNOSIS — Z79899 Other long term (current) drug therapy: Secondary | ICD-10-CM | POA: Diagnosis not present

## 2020-08-05 DIAGNOSIS — E785 Hyperlipidemia, unspecified: Secondary | ICD-10-CM | POA: Diagnosis not present

## 2020-08-05 DIAGNOSIS — E119 Type 2 diabetes mellitus without complications: Secondary | ICD-10-CM | POA: Diagnosis not present

## 2020-08-06 LAB — CBC/DIFF AMBIGUOUS DEFAULT
Basophils Absolute: 0.1 10*3/uL (ref 0.0–0.2)
Basos: 1 %
EOS (ABSOLUTE): 0.2 10*3/uL (ref 0.0–0.4)
Eos: 3 %
Hematocrit: 38.2 % (ref 34.0–46.6)
Hemoglobin: 11.9 g/dL (ref 11.1–15.9)
Immature Grans (Abs): 0 10*3/uL (ref 0.0–0.1)
Immature Granulocytes: 0 %
Lymphocytes Absolute: 2.3 10*3/uL (ref 0.7–3.1)
Lymphs: 47 %
MCH: 24.6 pg — ABNORMAL LOW (ref 26.6–33.0)
MCHC: 31.2 g/dL — ABNORMAL LOW (ref 31.5–35.7)
MCV: 79 fL (ref 79–97)
Monocytes Absolute: 0.3 10*3/uL (ref 0.1–0.9)
Monocytes: 5 %
Neutrophils Absolute: 2.2 10*3/uL (ref 1.4–7.0)
Neutrophils: 44 %
Platelets: 286 10*3/uL (ref 150–450)
RBC: 4.83 x10E6/uL (ref 3.77–5.28)
RDW: 23.6 % — ABNORMAL HIGH (ref 11.7–15.4)
WBC: 4.9 10*3/uL (ref 3.4–10.8)

## 2020-08-06 LAB — BASIC METABOLIC PANEL
BUN/Creatinine Ratio: 21 (ref 12–28)
BUN: 17 mg/dL (ref 8–27)
CO2: 26 mmol/L (ref 20–29)
Calcium: 9.6 mg/dL (ref 8.7–10.3)
Chloride: 98 mmol/L (ref 96–106)
Creatinine, Ser: 0.82 mg/dL (ref 0.57–1.00)
GFR calc Af Amer: 84 mL/min/{1.73_m2} (ref >59)
GFR calc non Af Amer: 73 mL/min/{1.73_m2} (ref >59)
Glucose: 132 mg/dL — ABNORMAL HIGH (ref 65–99)
Potassium: 4.4 mmol/L (ref 3.5–5.2)
Sodium: 138 mmol/L (ref 134–144)

## 2020-08-06 LAB — LIPID PANEL
Chol/HDL Ratio: 4.1 ratio (ref 0.0–4.4)
Cholesterol, Total: 161 mg/dL (ref 100–199)
HDL: 39 mg/dL — ABNORMAL LOW (ref >39)
LDL Chol Calc (NIH): 90 mg/dL (ref 0–99)
Triglycerides: 184 mg/dL — ABNORMAL HIGH (ref 0–149)
VLDL Cholesterol Cal: 32 mg/dL (ref 5–40)

## 2020-08-06 LAB — MICROALBUMIN / CREATININE URINE RATIO
Creatinine, Urine: 11.1 mg/dL
Microalb/Creat Ratio: <27 mg/g creat (ref 0–29)
Microalbumin, Urine: <3 ug/mL

## 2020-08-06 LAB — HEPATIC FUNCTION PANEL
ALT: 21 IU/L (ref 0–32)
AST: 22 IU/L (ref 0–40)
Albumin: 4.8 g/dL (ref 3.8–4.8)
Alkaline Phosphatase: 72 IU/L (ref 44–121)
Bilirubin Total: 0.7 mg/dL (ref 0.0–1.2)
Bilirubin, Direct: 0.16 mg/dL (ref 0.00–0.40)
Total Protein: 7.3 g/dL (ref 6.0–8.5)

## 2020-08-06 LAB — HEMOGLOBIN A1C
Est. average glucose Bld gHb Est-mCnc: 146 mg/dL
Hgb A1c MFr Bld: 6.7 % — ABNORMAL HIGH (ref 4.8–5.6)

## 2020-08-06 LAB — FERRITIN: Ferritin: 23 ng/mL (ref 15–150)

## 2020-08-06 LAB — IRON AND TIBC
Iron Saturation: 37 % (ref 15–55)
Iron: 124 ug/dL (ref 27–139)
Total Iron Binding Capacity: 338 ug/dL (ref 250–450)
UIBC: 214 ug/dL (ref 118–369)

## 2020-08-06 LAB — T4, FREE: Free T4: 1.52 ng/dL (ref 0.82–1.77)

## 2020-08-06 LAB — TSH: TSH: 0.882 u[IU]/mL (ref 0.450–4.500)

## 2020-08-11 ENCOUNTER — Ambulatory Visit (INDEPENDENT_AMBULATORY_CARE_PROVIDER_SITE_OTHER): Payer: Medicare HMO | Admitting: Family Medicine

## 2020-08-11 ENCOUNTER — Other Ambulatory Visit: Payer: Self-pay

## 2020-08-11 ENCOUNTER — Encounter: Payer: Self-pay | Admitting: Family Medicine

## 2020-08-11 VITALS — BP 136/66 | HR 83 | Ht 60.0 in | Wt 128.0 lb

## 2020-08-11 DIAGNOSIS — E119 Type 2 diabetes mellitus without complications: Secondary | ICD-10-CM | POA: Diagnosis not present

## 2020-08-11 DIAGNOSIS — D509 Iron deficiency anemia, unspecified: Secondary | ICD-10-CM | POA: Diagnosis not present

## 2020-08-11 DIAGNOSIS — E039 Hypothyroidism, unspecified: Secondary | ICD-10-CM

## 2020-08-11 DIAGNOSIS — M5442 Lumbago with sciatica, left side: Secondary | ICD-10-CM

## 2020-08-11 DIAGNOSIS — G8929 Other chronic pain: Secondary | ICD-10-CM

## 2020-08-11 DIAGNOSIS — E1169 Type 2 diabetes mellitus with other specified complication: Secondary | ICD-10-CM | POA: Diagnosis not present

## 2020-08-11 DIAGNOSIS — E785 Hyperlipidemia, unspecified: Secondary | ICD-10-CM | POA: Diagnosis not present

## 2020-08-11 DIAGNOSIS — Z23 Encounter for immunization: Secondary | ICD-10-CM | POA: Diagnosis not present

## 2020-08-11 MED ORDER — PANTOPRAZOLE SODIUM 40 MG PO TBEC
40.0000 mg | DELAYED_RELEASE_TABLET | Freq: Every day | ORAL | 1 refills | Status: DC
Start: 2020-08-11 — End: 2021-02-10

## 2020-08-11 MED ORDER — GLIPIZIDE ER 2.5 MG PO TB24
2.5000 mg | ORAL_TABLET | Freq: Every day | ORAL | 1 refills | Status: DC
Start: 2020-08-11 — End: 2021-02-10

## 2020-08-11 MED ORDER — LEVOTHYROXINE SODIUM 88 MCG PO TABS: 88.0000 ug | ORAL_TABLET | Freq: Every day | ORAL | 1 refills | Status: DC

## 2020-08-11 MED ORDER — METFORMIN HCL 500 MG PO TABS: ORAL_TABLET | ORAL | 1 refills | Status: DC

## 2020-08-11 MED ORDER — ATORVASTATIN CALCIUM 20 MG PO TABS
20.0000 mg | ORAL_TABLET | Freq: Every day | ORAL | 1 refills | Status: DC
Start: 2020-08-11 — End: 2021-02-07

## 2020-08-11 MED ORDER — LOSARTAN POTASSIUM 50 MG PO TABS
50.0000 mg | ORAL_TABLET | Freq: Every day | ORAL | 1 refills | Status: DC
Start: 2020-08-11 — End: 2021-02-07

## 2020-08-11 NOTE — Progress Notes (Signed)
Subjective:    Patient ID: Nicole Lam, female    DOB: January 24, 1951, 69 y.o.   MRN: [id number]  Diabetes She presents for her follow-up diabetic visit. She has type 2 diabetes mellitus. Pertinent negatives for hypoglycemia include no confusion or dizziness. Pertinent negatives for diabetes include no chest pain, no fatigue, no polydipsia, no polyphagia and no weakness. Current diabetic treatments: metformin. She is compliant with treatment all of the time. Home blood sugar record trend: does not know how to use glucose monitor. Eye exam is current (due next month).  Very nice patient Patient incredibly keeps working. She does not think she will retire for another 1 to 2 years. She relates a lot of pain and discomfort in joints sometimes in the muscles intermittently. She states is more so on days she is active but not as much on other days. Denies numbness or tingling. Concerns about pain all over.   Has headache every day. Intermittent headaches somewhat global. Not temporal. No other particular underlying issues. No nausea vomiting or double vision. Takes tylenol.   Would like to have senior flu vaccine.   Patient has chronic low back pain uses hydrocodone sparingly would like to have a refill  Patient is being followed by gastroenterology regarding her hemoglobin recently they instructed her to back down on the iron tablet to 1/day Results for orders placed or performed in visit on 08/05/20  CBC/Diff Ambiguous Default  Result Value Ref Range   WBC 4.9 3.4 - 10.8 x10E3/uL   RBC 4.83 3.77 - 5.28 x10E6/uL   Hemoglobin 11.9 11.1 - 15.9 g/dL   Hematocrit 38.2 34.0 - 46.6 %   MCV 79 79 - 97 fL   MCH 24.6 (L) 26.6 - 33.0 pg   MCHC 31.2 (L) 31 - 35 g/dL   RDW 23.6 (H) 11.7 - 15.4 %   Platelets 286 150 - 450 x10E3/uL   Neutrophils 44 Not Estab. %   Lymphs 47 Not Estab. %   Monocytes 5 Not Estab. %   Eos 3 Not Estab. %   Basos 1 Not Estab. %   Neutrophils Absolute 2.2 1.40 - 7.00  x10E3/uL   Lymphocytes Absolute 2.3 0 - 3 x10E3/uL   Monocytes Absolute 0.3 0 - 0 x10E3/uL   EOS (ABSOLUTE) 0.2 0.0 - 0.4 x10E3/uL   Basophils Absolute 0.1 0 - 0 x10E3/uL   Immature Granulocytes 0 Not Estab. %   Immature Grans (Abs) 0.0 0.0 - 0.1 x10E3/uL   Hematology Comments: Note:   Iron and TIBC  Result Value Ref Range   Total Iron Binding Capacity 338 250 - 450 ug/dL   UIBC 214 118 - 369 ug/dL   Iron 124 27 - 139 ug/dL   Iron Saturation 37 15 - 55 %  Ferritin  Result Value Ref Range   Ferritin 23 15.0 - 150.0 ng/mL      Review of Systems  Constitutional: Negative for activity change, appetite change and fatigue.  HENT: Negative for congestion and rhinorrhea.   Respiratory: Negative for cough and shortness of breath.   Cardiovascular: Negative for chest pain and leg swelling.  Gastrointestinal: Negative for abdominal pain and diarrhea.  Endocrine: Negative for polydipsia and polyphagia.  Skin: Negative for color change.  Neurological: Negative for dizziness and weakness.  Psychiatric/Behavioral: Negative for behavioral problems and confusion.       Objective:   Physical Exam Vitals reviewed.  Constitutional:      General: She is not in acute distress. HENT:  Head: Normocephalic.  Cardiovascular:     Rate and Rhythm: Normal rate and regular rhythm.     Heart sounds: Normal heart sounds. No murmur heard.   Pulmonary:     Effort: Pulmonary effort is normal.     Breath sounds: Normal breath sounds.  Lymphadenopathy:     Cervical: No cervical adenopathy.  Neurological:     Mental Status: She is alert.  Psychiatric:        Behavior: Behavior normal.           Assessment & Plan:  1. Hypothyroidism, unspecified type Thyroid doing well with medication.  2. Type 2 diabetes mellitus without complication, without long-term current use of insulin ([place]) Diabetes under good control watch diet she did have one low sugar spell but otherwise not having any low  sugar spells she tries avoid this by eating on a regular basis  3. Hyperlipidemia associated with type 2 diabetes mellitus ([place]) LDL not quite at goal but patient does not tolerate higher dose of statin continue current statin watch diet closely  4. Chronic bilateral low back pain with left-sided sciatica Back pain uses hydrocodone intermittently sparingly caution drowsiness from use only  5. Iron deficiency anemia, unspecified iron deficiency anemia type Iron deficient anemia but is improving.  GI we did recently reduce the iron tablets to 1/day  Senior dose flu shot today Follow-up 6 months follow-up sooner problems Up-to-date on mammogram  Patient denies red or swollen joints. Sed rate and ANA was checked previously and was normal I doubt that this is rheumatoid arthritis I think is more likely osteoarthritis and overuse I believe a lot of her body aches and joint discomforts are related to her age as well as her excessively busy schedule with work. I hope for her that she will be able to retire so she can take a better pace. In the meantime patient was instructed to follow-up sooner if any major issues.

## 2020-08-12 ENCOUNTER — Telehealth: Payer: Self-pay | Admitting: *Deleted

## 2020-08-12 ENCOUNTER — Other Ambulatory Visit: Payer: Self-pay | Admitting: Family Medicine

## 2020-08-12 MED ORDER — HYDROCODONE-ACETAMINOPHEN 7.5-325 MG PO TABS
ORAL_TABLET | ORAL | 0 refills | Status: DC
Start: 2020-08-12 — End: 2021-02-10

## 2020-08-12 NOTE — Telephone Encounter (Signed)
Pt.notified

## 2020-08-12 NOTE — Telephone Encounter (Signed)
Husband called and stated that [facility] in [place] has still not received patient's script for Hydrocodone

## 2020-08-12 NOTE — Telephone Encounter (Signed)
Prescription was sent in this afternoon thank you

## 2020-08-14 DIAGNOSIS — E119 Type 2 diabetes mellitus without complications: Secondary | ICD-10-CM | POA: Diagnosis not present

## 2020-09-30 ENCOUNTER — Ambulatory Visit: Payer: Medicare HMO | Admitting: Gastroenterology

## 2020-10-17 ENCOUNTER — Telehealth: Payer: Self-pay | Admitting: Internal Medicine

## 2020-10-17 NOTE — Telephone Encounter (Signed)
ERROR

## 2020-10-18 ENCOUNTER — Other Ambulatory Visit: Payer: Self-pay

## 2020-10-18 ENCOUNTER — Ambulatory Visit (INDEPENDENT_AMBULATORY_CARE_PROVIDER_SITE_OTHER): Payer: Medicare HMO | Admitting: Family Medicine

## 2020-10-18 ENCOUNTER — Encounter: Payer: Self-pay | Admitting: Family Medicine

## 2020-10-18 VITALS — HR 85 | Temp 97.1°F | Resp 16

## 2020-10-18 DIAGNOSIS — H66002 Acute suppurative otitis media without spontaneous rupture of ear drum, left ear: Secondary | ICD-10-CM | POA: Diagnosis not present

## 2020-10-18 DIAGNOSIS — J029 Acute pharyngitis, unspecified: Secondary | ICD-10-CM

## 2020-10-18 LAB — POCT RAPID STREP A (OFFICE): Rapid Strep A Screen: NEGATIVE

## 2020-10-18 MED ORDER — AMOXICILLIN 400 MG/5ML PO SUSR: ORAL | 0 refills | Status: DC

## 2020-10-18 NOTE — Progress Notes (Signed)
Patient ID: Nicole Lam, female    DOB: 1951-07-26, 70 y.o.   MRN: [id number]   Chief Complaint  Patient presents with  . Sore Throat    Patient reports a shooting pain in her left ear and sore left side of throat since last week. Ear bothered her awhile back- felt a itching that she could scratch and this occurs off and on.    Subjective:  CC: Left ear pain and left side of throat soreness  This is a new problem.  Presents today for an acute visit with a complaint of sore throat on the left side, and left ear pain.  Ear pain is been present for 3 days sore throat present for 1 week.  Has tried Tylenol with minimal relief.  No known Covid exposures.  Reports chest tightness (not a new symptom), ear pain and throat pain.  Denies fever, chills, chest pain, shortness of breath, congestion, cough, abdominal pain.  Reports that she is unable to swallow large pills.    Medical History [place] has a past medical history of Diabetes ([place]), Fatty liver, Hemorrhoid, Hyperlipidemia, Hypertension, Hypothyroidism, and Lipoma of neck.   Outpatient Encounter Medications as of 10/18/2020  Medication Sig  . amoxicillin (AMOXIL) 400 MG/5ML suspension Take 6 cc by mouth two times a day for 10 days.  Marland Kitchen acyclovir (ZOVIRAX) 400 MG tablet TAKE 1 TABLET BY MOUTH FIVE TIMES DAILY (Patient taking differently: as needed. )  . atorvastatin (LIPITOR) 20 MG tablet Take 1 tablet (20 mg total) by mouth daily.  . Blood Glucose Monitoring Suppl (TRUE METRIX METER) w/Device KIT USE AS DIRECTED  . diclofenac sodium (VOLTAREN) 1 % GEL Apply 4 g topically 4 (four) times daily. (Patient taking differently: Apply 4 g topically 2 (two) times daily as needed (pain). )  . ELDERBERRY PO Take by mouth daily.  Marland Kitchen estradiol (ESTRACE) 0.5 MG tablet TAKE 1 TABLET EVERY DAY  . glipiZIDE (GLUCOTROL XL) 2.5 MG 24 hr tablet Take 1 tablet (2.5 mg total) by mouth daily with breakfast.  . HYDROcodone-acetaminophen (NORCO) 7.5-325 MG tablet 1 q4  hours prn  . levothyroxine (SYNTHROID) 88 MCG tablet Take 1 tablet (88 mcg total) by mouth daily.  Marland Kitchen loratadine (CLARITIN) 10 MG tablet Take 10 mg by mouth daily.  Marland Kitchen losartan (COZAAR) 50 MG tablet Take 1 tablet (50 mg total) by mouth daily.  . metFORMIN (GLUCOPHAGE) 500 MG tablet Take 2 tablets in the morning and 2 tablets in the evening  . Multiple Vitamins-Minerals (MULTIVITAMIN WITH MINERALS) tablet Take 1 tablet by mouth daily.  Marland Kitchen OVER THE COUNTER MEDICATION Take 1 capsule by mouth in the morning and at bedtime. Blood sugar Blaster  . pantoprazole (PROTONIX) 40 MG tablet Take 1 tablet (40 mg total) by mouth daily.  . phenylephrine-shark liver oil-mineral oil-petrolatum (PREPARATION H) 0.25-3-14-71.9 % rectal ointment Place 1 application rectally as needed for hemorrhoids.   . polyethylene glycol (MIRALAX / GLYCOLAX) packet Take 17 g by mouth daily.   . TRUE METRIX BLOOD GLUCOSE TEST test strip TEST BLOOD SUGAR EVERY DAY  . UNABLE TO FIND 2 capsules daily. Med Name: [facility]   No facility-administered encounter medications on file as of 10/18/2020.     Review of Systems  Constitutional: Negative for chills and fever.  HENT: Positive for sore throat. Negative for congestion.   Respiratory: Positive for chest tightness. Negative for cough, shortness of breath and wheezing.        Not new symptom  Cardiovascular:  Negative for chest pain.  Gastrointestinal: Negative for abdominal pain, nausea and vomiting.     Vitals Pulse 85   Temp (!) 97.1 F (36.2 C)   Resp 16   SpO2 99%   Objective:   Physical Exam Vitals reviewed.  Constitutional:      General: She is not in acute distress.    Appearance: Normal appearance.  HENT:     Right Ear: Tympanic membrane normal.     Left Ear: Tenderness present. Tympanic membrane is erythematous.     Mouth/Throat:     Pharynx: Posterior oropharyngeal erythema present. No oropharyngeal exudate.  Cardiovascular:     Rate and Rhythm: Normal  rate and regular rhythm.     Heart sounds: Normal heart sounds.  Pulmonary:     Effort: Pulmonary effort is normal.     Breath sounds: Normal breath sounds.  Abdominal:     General: Bowel sounds are normal.  Skin:    General: Skin is warm and dry.  Neurological:     General: No focal deficit present.     Mental Status: She is alert.  Psychiatric:        Behavior: Behavior normal.     Results for orders placed or performed in visit on 10/18/20  POCT rapid strep A  Result Value Ref Range   Rapid Strep A Screen Negative Negative    Assessment and Plan   1. Sore throat - Novel Coronavirus, NAA ([facility]) - POCT rapid strep A - Culture, Group A Strep  2. Non-recurrent acute suppurative otitis media of left ear without spontaneous rupture of tympanic membrane - amoxicillin (AMOXIL) 400 MG/5ML suspension; Take 6 cc by mouth two times a day for 10 days.  Dispense: 120 mL; Refill: 0   Sore throat: Rapid strep negative will send for culture.  Left ear otitis media: Due to inability to take large pills, will treat with amoxicillin suspension.  Agrees with plan of care discussed today. Understands warning signs to seek further care: Chest pain, shortness of breath, any significant change in health. Understands to follow-up if symptoms do not improve, or worsen.  Will notify once Covid results are available.  Pecolia Ades, FNP-C

## 2020-10-20 LAB — NOVEL CORONAVIRUS, NAA: SARS-CoV-2, NAA: NOT DETECTED

## 2020-10-20 LAB — CULTURE, GROUP A STREP

## 2020-10-20 LAB — SARS-COV-2, NAA 2 DAY TAT

## 2020-10-24 ENCOUNTER — Telehealth: Payer: Self-pay

## 2020-10-24 NOTE — Telephone Encounter (Signed)
Patient returning wendy's call from last week.  No nurse available.  I read [name]'s note and asked the patient if she was still having a sore throat.  She said her throat is fine now.  Her ear is only slightly better, some better but still having occasional shooting pains.  Patient left cell phone at home.  She said it was ok to leave a detailed message on her home phone and she will get off work and be home around 5.  She said she will try to call us back before the end of the day.

## 2020-10-25 ENCOUNTER — Other Ambulatory Visit: Payer: Self-pay | Admitting: Family Medicine

## 2020-10-25 ENCOUNTER — Telehealth: Payer: Self-pay | Admitting: *Deleted

## 2020-10-25 DIAGNOSIS — H66002 Acute suppurative otitis media without spontaneous rupture of ear drum, left ear: Secondary | ICD-10-CM

## 2020-10-25 MED ORDER — CEFPROZIL 250 MG/5ML PO SUSR
ORAL | 0 refills | Status: DC
Start: 2020-10-25 — End: 2020-12-15

## 2020-10-25 NOTE — Telephone Encounter (Signed)
Patient notifed

## 2020-10-25 NOTE — Telephone Encounter (Signed)
New antibiotic sent to [facility]

## 2020-10-25 NOTE — Progress Notes (Signed)
Change in therapy.

## 2020-10-25 NOTE — Telephone Encounter (Signed)
See phone call from yesterday. Pt called back today and states left ear and left side of face is sensitive. Sore throat is a lot better but every once in awhile can feel a sensitive spot. No fever. Pt states she is still taking amoxil but not helping much. Pt states she needs something she can swallow. Cannot swallow big pills.   [facility] [place].

## 2020-11-04 ENCOUNTER — Ambulatory Visit: Payer: Medicare HMO | Admitting: Gastroenterology

## 2020-11-13 DIAGNOSIS — E119 Type 2 diabetes mellitus without complications: Secondary | ICD-10-CM | POA: Diagnosis not present

## 2020-11-16 ENCOUNTER — Telehealth: Payer: Self-pay | Admitting: Internal Medicine

## 2020-11-16 NOTE — Telephone Encounter (Signed)
Recall for ultrasound 

## 2020-11-16 NOTE — Telephone Encounter (Signed)
Letter mailed

## 2020-11-16 NOTE — Telephone Encounter (Signed)
error 

## 2020-11-25 DIAGNOSIS — H5203 Hypermetropia, bilateral: Secondary | ICD-10-CM | POA: Diagnosis not present

## 2020-11-25 DIAGNOSIS — Z7984 Long term (current) use of oral hypoglycemic drugs: Secondary | ICD-10-CM | POA: Diagnosis not present

## 2020-11-25 DIAGNOSIS — H524 Presbyopia: Secondary | ICD-10-CM | POA: Diagnosis not present

## 2020-11-25 DIAGNOSIS — Z0100 Encounter for examination of eyes and vision without abnormal findings: Secondary | ICD-10-CM | POA: Diagnosis not present

## 2020-11-25 DIAGNOSIS — H25813 Combined forms of age-related cataract, bilateral: Secondary | ICD-10-CM | POA: Diagnosis not present

## 2020-11-25 DIAGNOSIS — H52223 Regular astigmatism, bilateral: Secondary | ICD-10-CM | POA: Diagnosis not present

## 2020-11-25 DIAGNOSIS — E119 Type 2 diabetes mellitus without complications: Secondary | ICD-10-CM | POA: Diagnosis not present

## 2020-11-25 LAB — HM DIABETES EYE EXAM

## 2020-12-07 ENCOUNTER — Other Ambulatory Visit: Payer: Self-pay | Admitting: Obstetrics & Gynecology

## 2020-12-14 NOTE — Progress Notes (Unsigned)
Referring Provider: Kathyrn Drown, MD Primary Care Physician:  Kathyrn Drown, MD Primary GI Physician: Dr. Gala Romney  Chief Complaint  Patient presents with  . IDA    F/U  . fatty liver    F/u    HPI:   Nicole Lam is a 70 y.o. female presenting today for follow-up.   History of IDA, dysphagia, constipation, suspected [name] with mildly elevated LFTs and Medicare F2/F3 in 2017. Extensive serologies in the past including hepatitis B, hepatitis C, smooth muscle antibody, mitochondrial antibody, ANA, immunoglobulins, ceruloplasmin, TTG IgA all negative. Last [place] completed on 06/10/2020 with liver parenchyma echogenicity diffusely increased with coarsened echotexture, likely hepatic steatosis.  Some nodular contour as can be seen in cirrhosis.  Recommended repeat ultrasound in 6 months.    EGD and colonoscopy June 2021 for IDA with 1 tubular adenoma, small Schatzki's ring s/p dilation, small hiatal hernia, otherwise normal exam.  Last seen in our office 06/02/2020.  No overt GI bleeding.  GERD well controlled on Protonix 40 mg daily.  Bowels moving well with MiraLAX daily.  Concerned about swallowing Givens capsule, but would try.  No signs or symptoms of decompensated liver disease.  Patient was unable to swallow agile capsule.  Offered EGD with placement of patency.  If patency capsule makes it through, would repeat EGD to place Givens capsule.  Patient stated there was no way she would be able to take time off of work to have procedures done.  Discuss further with Dr. Gala Romney.  Offered CT enterography further evaluate small bowel.  Also recommended oral iron twice daily and repeat CBC in 8 weeks.  Patient stated she was not going to complete a test where she had to drink contrast as she would get nauseated and vomit.  Also, she did not have time to take at work.  She would call back if she wanted to have any test done.  Reviewed labs 08/05/2020: Hemoglobin improved to 11.9, MCH improved  but still low at 24.6, MCHC also improved below at 31.2.  Iron improved to 124, saturation improved to 37%, ferritin improved to 23.  Recommended decreasing iron to once daily x4 weeks then stop, continue to avoid NSAID products, and keep follow-up in December.  Patient canceled her appointment in December and January.  Today:  IDA: No NSAIDs. No overt GI bleeding. Still taking iron daily.  States she will never have Givens capsule.  Prefers to hold off on CT enterography for now.  States contrast will make her throw up.  Prefers to monitor as her hemoglobin was improving with iron.  No GERD symptoms.  In fact, she states she has never had GERD symptoms.  States she is started on Protonix to help with trouble swallowing.  Reports history of sensation of swallowing air and not being able to swallow her foods.  This has resolved with Protonix.  No abdominal pain, nausea, or vomiting.   No diarrhea. Constipation well managed with MiraLAX daily.   [name]/elevated LFTs/nodular liver: Most recent HFP 08/05/2020 with LFTs normalized (AST 22, ALT 21, alk phos 72).  Denies any medication changes.  Weight is stable.  Her hemoglobin A1c has improved to 6.7 in October.  No swelling in the abdomen or LE, confusion or changes in mental status, yellowing of the eyes or skin, or bruising/bleeding.  No family history of liver disease.    Past Medical History:  Diagnosis Date  . Diabetes ([place])   . Fatty liver  with mild LFT elevation, F2/F3 2017  . Hemorrhoid   . Hyperlipidemia   . Hypertension   . Hypothyroidism   . Lipoma of neck     Past Surgical History:  Procedure Laterality Date  . ABDOMINAL HYSTERECTOMY    . AGILE CAPSULE N/A 06/14/2020   Procedure: AGILE CAPSULE;  Surgeon: Daneil Dolin, MD; unable to swallow agile  . APPENDECTOMY    . COLONOSCOPY  05/30/05   PYK:DXIPJA anal papilla, otherwise normal rectum.and colon  . COLONOSCOPY N/A 11/04/2015   RMR: normal colonoscopy  . COLONOSCOPY  WITH PROPOFOL N/A 04/07/2020   Procedure: COLONOSCOPY WITH PROPOFOL;  Surgeon: Daneil Dolin, MD; One 4 mm polyp at ileocecal valve, otherwise normal exam.  Patient was heme-negative.  Pathology with tubular adenoma.  No recommendations to repeat colonoscopy.  Marland Kitchen DILATION AND CURETTAGE OF UTERUS    . ESOPHAGEAL DILATION  04/07/2020   Procedure: ESOPHAGEAL DILATION;  Surgeon: Daneil Dolin, MD;  [facility];  Service: Endoscopy;;  . ESOPHAGOGASTRODUODENOSCOPY (EGD) WITH PROPOFOL N/A 04/07/2020   Procedure: ESOPHAGOGASTRODUODENOSCOPY (EGD) WITH PROPOFOL;  Surgeon: Daneil Dolin, MD;  Small Schatzki's ring s/p dilated, small hiatal hernia, otherwise normal exam.  . POLYPECTOMY  04/07/2020   Procedure: POLYPECTOMY;  Surgeon: Daneil Dolin, MD;  [facility]: [facility];  Service: Endoscopy;;    Current Outpatient Medications  Medication Sig Dispense Refill  . acyclovir (ZOVIRAX) 400 MG tablet TAKE 1 TABLET BY MOUTH FIVE TIMES DAILY (Patient taking differently: As needed) 75 tablet 5  . atorvastatin (LIPITOR) 20 MG tablet Take 1 tablet (20 mg total) by mouth daily. 90 tablet 1  . Blood Glucose Monitoring Suppl (TRUE METRIX METER) w/Device KIT USE AS DIRECTED 1 kit 0  . diclofenac sodium (VOLTAREN) 1 % GEL Apply 4 g topically 4 (four) times daily. (Patient taking differently: Apply 4 g topically 2 (two) times daily as needed (pain).) 5 Tube 5  . estradiol (ESTRACE) 0.5 MG tablet TAKE 1 TABLET EVERY DAY 90 tablet 11  . ferrous sulfate 325 (65 FE) MG tablet Take 325 mg by mouth daily with breakfast.    . glipiZIDE (GLUCOTROL XL) 2.5 MG 24 hr tablet Take 1 tablet (2.5 mg total) by mouth daily with breakfast. 90 tablet 1  . HYDROcodone-acetaminophen (NORCO) 7.5-325 MG tablet 1 q4 hours prn 30 tablet 0  . levothyroxine (SYNTHROID) 88 MCG tablet Take 1 tablet (88 mcg total) by mouth daily. 90 tablet 1  . loratadine (CLARITIN) 10 MG tablet Take 10 mg by mouth daily.    Marland Kitchen losartan (COZAAR) 50  MG tablet Take 1 tablet (50 mg total) by mouth daily. 90 tablet 1  . metFORMIN (GLUCOPHAGE) 500 MG tablet Take 2 tablets in the morning and 2 tablets in the evening (Patient taking differently: Take 500 mg by mouth 2 (two) times daily with a meal.) 360 tablet 1  . Multiple Vitamins-Minerals (MULTIVITAMIN WITH MINERALS) tablet Take 1 tablet by mouth daily.    Marland Kitchen OVER THE COUNTER MEDICATION Take 1 capsule by mouth in the morning and at bedtime. Blood sugar Blaster    . pantoprazole (PROTONIX) 40 MG tablet Take 1 tablet (40 mg total) by mouth daily. 90 tablet 1  . phenylephrine-shark liver oil-mineral oil-petrolatum (PREPARATION H) 0.25-3-14-71.9 % rectal ointment Place 1 application rectally as needed for hemorrhoids.     . polyethylene glycol (MIRALAX / GLYCOLAX) packet Take 17 g by mouth daily.     . TRUE METRIX BLOOD GLUCOSE TEST test  strip TEST BLOOD SUGAR EVERY DAY 100 strip 0   No current facility-administered medications for this visit.    Allergies as of 12/15/2020  . (No Known Allergies)    Family History  Problem Relation Age of Onset  . Heart disease Mother   . Hypertension Mother   . Birth defects Daughter   . Colon cancer Neg Hx   . Liver disease Neg Hx   . Colon polyps Neg Hx     Social History   Socioeconomic History  . Marital status: Married    Spouse name: Not on file  . Number of children: Not on file  . Years of education: Not on file  . Highest education level: Not on file  Occupational History  . Not on file  Tobacco Use  . Smoking status: Never Smoker  . Smokeless tobacco: Never Used  Substance and Sexual Activity  . Alcohol use: No    Alcohol/week: 0.0 standard drinks  . Drug use: No  . Sexual activity: Yes    Birth control/protection: Surgical    Comment: hyst  Other Topics Concern  . Not on file  Social History Narrative  . Not on file   Social Determinants of Health   Financial Resource Strain: Not on file  Food Insecurity: Not on file   Transportation Needs: Not on file  Physical Activity: Not on file  Stress: Not on file  Social Connections: Not on file    Review of Systems: Gen: Denies fever, chills, cold or flulike symptoms, lightheadedness, dizziness, presyncope, syncope. CV: Denies chest pain or palpitations. Resp: Denies dyspnea or cough. GI: See HPI Heme: See HPI  Physical Exam: BP (!) 147/74   Pulse 84   Temp (!) 97.1 F (36.2 C)   Ht 5' (1.524 m)   Wt 132 lb (59.9 kg)   BMI 25.78 kg/m  General:   Alert and oriented. No distress noted. Pleasant and cooperative.  Head:  Normocephalic and atraumatic. Eyes:  Conjuctiva clear without scleral icterus. Heart:  S1, S2 present without murmurs appreciated. Lungs:  Clear to auscultation bilaterally. No wheezes, rales, or rhonchi. No distress.  Abdomen:  +BS, soft, non-tender and non-distended. No rebound or guarding. No HSM or masses noted. Msk:  Symmetrical without gross deformities. Normal posture. Extremities:  Without edema. Neurologic:  Alert and  oriented x4 Psych:  Normal mood and affect.   Assessment: 70 year old female presenting today for follow-up with history of IDA, dysphagia, constipation, mildly elevated LFTs with suspected [name] with F2/F3 on elastography in 2017, most recent ultrasound in August 2021 with some nodular appearance of the liver.   IDA: Diagnosed March 2021 with hemoglobin 10.5, microcytic indices, ferritin 10, Hemoccult positive, no overt GI bleeding.  Admitted to start with taking BC powders for headaches, meloxicam, and 81 mg aspirin, but has since discontinued all NSAIDs.  Colonoscopy and EGD 04/07/2020.  Colonoscopy with 1 tubular adenoma, otherwise normal exam.  EGD with small Schatzki's ring s/p dilation, small hiatal hernia, otherwise normal exam.  Attempted to arrange Givens capsule, but patient was unable to swallow patency.  Offered placing patency via EGD, then repeat EGD to place Givens, but patient declined that she did  not have time off work for this.  Also offered CT enterography, but patient declined.  Started oral iron twice daily in September 2021.  Labs in October 2021 with hemoglobin improved to 11.9, iron 124, saturation 37%, ferritin 23.  Iron was reduced to once daily at that time.  She  continues to do well without overt GI bleeding and remains on oral iron at this time.  Also on Protonix 40 mg daily.  We again discussed the possibility of Givens capsule, but patient states she is never going to have this done.  Discussed possibility of CT enterography.  Ultimately, it was decided to repeat labs to see if hemoglobin and iron panel have normalized, if so we will stop oral iron and monitor.  If hemoglobin declines, consider CT enterography.  Dysphagia: History of small Schatzki's ring s/p dilation in June 2021.  Patient reports history of sensation of swallowing air and not being able to swallow her foods, but this has resolved with Protonix 40 mg daily.  She will continue her current medications.  Constipation: Well managed with MiraLAX daily.  No alarm symptoms.  Advised to continue this.  [name]/elevated LFTs: History of fatty liver, suspected [name] with chronic mildly elevated LFTs.  Extensive serologic evaluation unrevealing previously (see HPI), but unknown immunity status to Hep A/B.  Elastography 2017 with mild nodular hepatic contour, metavir F2/F3. No mention of nodular hepatic contour again until last [place] in August 2021 which states the echotexture is coarsened with possible mild contour nodularity. Query advancing fibrosis versus early cirrhosis.  Most recent HFP in October 2020 with normalization of LFTs, which is the first time in years.  She denies medication changes.  Weight has remained stable.  Notably, hemoglobin A1c has improved, down to 6.7 in October 2021.  Platelets remain within normal limits.  EGD in June 2021 with no varices.  No signs or symptoms of advanced liver disease. She does not drink  alcohol. We will plan to update labs, recheck hepatitis labs, and update ultrasound with elastography to evaluate nodular hepatic contour.   Plan: 1.  CBC, CMP, INR, iron panel with ferritin, hepatitis A antibody total, hepatitis B surface antigen, hepatitis B surface antibody, hepatitis B core antibody total, hepatitis C antibody. 2.  Ultrasound abdomen complete with elastography. 3.  Continue oral iron daily for now.  If hemoglobin and iron within normal limits, will stop oral iron and monitor hemoglobin.  If hemoglobin declines, consider CT enterography for further evaluation of IDA. 4.  Continue Protonix 40 mg daily. 5.  Continue MiraLAX daily. 6.  Counseled extensively on fatty liver as well as possible early cirrhosis.   Handout provided on fatty liver.  Advised not to consume more than 2000 mg of Tylenol per day.  Also advised not to consume more than 2000 mg of sodium per day. 7.  Avoid all NSAIDs. 8.  Follow-up in 6 months or sooner if needed.   Aliene Altes, PA-C [facility] 12/15/2020

## 2020-12-15 ENCOUNTER — Encounter: Payer: Self-pay | Admitting: Internal Medicine

## 2020-12-15 ENCOUNTER — Ambulatory Visit: Payer: Medicare HMO | Admitting: Gastroenterology

## 2020-12-15 ENCOUNTER — Other Ambulatory Visit: Payer: Self-pay

## 2020-12-15 ENCOUNTER — Encounter: Payer: Self-pay | Admitting: Gastroenterology

## 2020-12-15 VITALS — BP 147/74 | HR 84 | Temp 97.1°F | Ht 60.0 in | Wt 132.0 lb

## 2020-12-15 DIAGNOSIS — K5900 Constipation, unspecified: Secondary | ICD-10-CM

## 2020-12-15 DIAGNOSIS — K760 Fatty (change of) liver, not elsewhere classified: Secondary | ICD-10-CM | POA: Diagnosis not present

## 2020-12-15 DIAGNOSIS — R945 Abnormal results of liver function studies: Secondary | ICD-10-CM

## 2020-12-15 DIAGNOSIS — R1310 Dysphagia, unspecified: Secondary | ICD-10-CM

## 2020-12-15 DIAGNOSIS — R7989 Other specified abnormal findings of blood chemistry: Secondary | ICD-10-CM

## 2020-12-15 DIAGNOSIS — D509 Iron deficiency anemia, unspecified: Secondary | ICD-10-CM | POA: Diagnosis not present

## 2020-12-15 NOTE — Patient Instructions (Addendum)
Please have labs completed at [facility].  Have ultrasound completed at [place] taking Protonix 40 mg daily.  Continue taking iron daily for now.  Continue MiraLAX daily.  Instructions for fatty liver: Recommend 1-2# weight loss per week until ideal body weight through exercise & diet. Low fat/cholesterol diet.   Avoid sweets, sodas, fruit juices, sweetened beverages like tea, etc. Gradually increase exercise from 15 min daily up to 1 hr per day 5 days/week. Avoid alcohol use.  Continue to avoid all NSAIDs.   We will plan to follow-up with you in 6 months or sooner if needed.  We will call you with labs and ultrasound results.  Aliene Altes, PA-C [facility]     Nonalcoholic Fatty Liver Disease Diet, Adult Nonalcoholic fatty liver disease is a condition that causes fat to build up in and around the liver. The disease makes it harder for the liver to work the way that it should. Following a healthy diet can help to keep nonalcoholic fatty liver disease under control. It can also help to prevent or improve conditions that are associated with the disease, such as heart disease, diabetes, high blood pressure, and abnormal cholesterol levels. Along with regular exercise, this diet:  Promotes weight loss.  Helps to control blood sugar levels.  Helps to improve the way that the body uses insulin. What are tips for following this plan? Reading food labels Always check food labels for:  The amount of saturated fat in a food. You should limit your intake of saturated fat. Saturated fat is found in foods that come from animals, including meat and dairy products such as butter, cheese, and whole milk.  The amount of fiber in a food. You should choose high-fiber foods such as fruits, vegetables, and whole grains. Try to get 25-30 grams (g) of fiber a day.   Cooking  When cooking, use heart-healthy oils that are high in monounsaturated fats. These  include olive oil, canola oil, and avocado oil.  Limit frying or deep-frying foods. Cook foods using healthy methods such as baking, boiling, steaming, and grilling instead. Meal planning  You may want to keep track of how many calories you take in. Eating the right amount of calories will help you achieve a healthy weight. Meeting with a registered dietitian can help you get started.  Limit how often you eat takeout and fast food. These foods are usually very high in fat, salt, and sugar.  Use the glycemic index (GI) to plan your meals. The index tells you how quickly a food will raise your blood sugar. Choose low-GI foods (GI less than 55). These foods take a longer time to raise blood sugar. A registered dietitian can help you identify foods lower on the GI scale. Lifestyle  You may want to follow a Mediterranean diet. This diet includes a lot of vegetables, lean meats or fish, whole grains, fruits, and healthy oils and fats. What foods can I eat? Fruits Bananas. Apples. Oranges. Grapes. Papaya. Mango. Pomegranate. Kiwi. Grapefruit. Cherries. Vegetables Lettuce. Spinach. Peas. Beets. Cauliflower. Cabbage. Broccoli. Carrots. Tomatoes. Squash. Eggplant. Herbs. Peppers. Onions. Cucumbers. Brussels sprouts. Yams and sweet potatoes. Beans. Lentils. Grains Whole wheat or whole-grain foods, including breads, crackers, cereals, and pasta. Stone-ground whole wheat. Unsweetened oatmeal. Bulgur. Barley. Quinoa. Brown or wild rice. Corn or whole wheat flour tortillas. Meats and other proteins Lean meats. Poultry. Tofu. Seafood and shellfish. Dairy Low-fat or fat-free dairy products, such as yogurt, cottage cheese, or cheese. Beverages Water.  Sugar-free drinks. Tea. Coffee. Low-fat or skim milk. Milk alternatives, such as soy or almond milk. Real fruit juice. Fats and oils Avocado. Canola or olive oil. Nuts and nut butters. Seeds. Seasonings and condiments Mustard. Relish. Low-fat, low-sugar  ketchup and barbecue sauce. Low-fat or fat-free mayonnaise. Sweets and desserts Sugar-free sweets. The items listed above may not be a complete list of foods and beverages you can eat. Contact a dietitian for more information.   What foods should I limit or avoid? Meats and other proteins Limit red meat to 1-2 times a week. Dairy [employer]. Fats and oils Palm oil and coconut oil. Fried foods. Other foods Processed foods. Foods that contain a lot of salt or sodium. Sweets and desserts Sweets that contain sugar. Beverages Sweetened drinks, such as sweet tea, milkshakes, iced sweet drinks, and sodas. Alcohol. The items listed above may not be a complete list of foods and beverages you should avoid. Contact a dietitian for more information. Where to find more information The [employer] of Diabetes and Digestive and Kidney Diseases: [employer] Summary  Nonalcoholic fatty liver disease is a condition that causes fat to build up in and around the liver.  Following a healthy diet can help to keep nonalcoholic fatty liver disease under control. Your diet should be rich in fruits, vegetables, whole grains, and lean proteins.  Limit your intake of saturated fat. Saturated fat is found in foods that come from animals, including meat and dairy products such as butter, cheese, and whole milk.  This diet promotes weight loss, helps to control blood sugar levels, and helps to improve the way that the body uses insulin. This information is not intended to replace advice given to you by your health care provider. Make sure you discuss any questions you have with your health care provider. Document Revised: 01/23/2019 Document Reviewed: 10/23/2018 Elsevier Patient Education  [place].

## 2020-12-15 NOTE — Progress Notes (Signed)
Cc'ed to pcp °

## 2020-12-19 ENCOUNTER — Other Ambulatory Visit: Payer: Self-pay

## 2020-12-19 ENCOUNTER — Other Ambulatory Visit (HOSPITAL_COMMUNITY)
Admission: RE | Admit: 2020-12-19 | Discharge: 2020-12-19 | Disposition: A | Discharge status: Home / Self Care | Payer: Medicare HMO | Source: Ambulatory Visit | Attending: Gastroenterology | Admitting: Gastroenterology

## 2020-12-19 ENCOUNTER — Ambulatory Visit (HOSPITAL_COMMUNITY)
Admission: RE | Admit: 2020-12-19 | Discharge: 2020-12-19 | Disposition: A | Discharge status: Home / Self Care | Payer: Medicare HMO | Source: Ambulatory Visit | Attending: Gastroenterology | Admitting: Gastroenterology

## 2020-12-19 DIAGNOSIS — R945 Abnormal results of liver function studies: Secondary | ICD-10-CM | POA: Insufficient documentation

## 2020-12-19 DIAGNOSIS — D509 Iron deficiency anemia, unspecified: Secondary | ICD-10-CM | POA: Insufficient documentation

## 2020-12-19 DIAGNOSIS — K760 Fatty (change of) liver, not elsewhere classified: Secondary | ICD-10-CM | POA: Insufficient documentation

## 2020-12-19 LAB — CBC WITH DIFFERENTIAL/PLATELET
Abs Immature Granulocytes: 0.02 10*3/uL (ref 0.00–0.07)
Basophils Absolute: 0 10*3/uL (ref 0.0–0.1)
Basophils Relative: 1 %
Eosinophils Absolute: 0.2 10*3/uL (ref 0.0–0.5)
Eosinophils Relative: 4 %
HCT: 40.8 % (ref 36.0–46.0)
Hemoglobin: 13.4 g/dL (ref 12.0–15.0)
Immature Granulocytes: 0 %
Lymphocytes Relative: 38 %
Lymphs Abs: 1.7 10*3/uL (ref 0.7–4.0)
MCH: 30.6 pg (ref 26.0–34.0)
MCHC: 32.8 g/dL (ref 30.0–36.0)
MCV: 93.2 fL (ref 80.0–100.0)
Monocytes Absolute: 0.3 10*3/uL (ref 0.1–1.0)
Monocytes Relative: 7 %
Neutro Abs: 2.3 10*3/uL (ref 1.7–7.7)
Neutrophils Relative %: 50 %
Platelets: 263 10*3/uL (ref 150–400)
RBC: 4.38 MIL/uL (ref 3.87–5.11)
RDW: 13.8 % (ref 11.5–15.5)
WBC: 4.5 10*3/uL (ref 4.0–10.5)
nRBC: 0 % (ref 0.0–0.2)

## 2020-12-19 LAB — FERRITIN: Ferritin: 23 ng/mL (ref 11–307)

## 2020-12-19 LAB — COMPREHENSIVE METABOLIC PANEL
ALT: 33 U/L (ref 0–44)
AST: 29 U/L (ref 15–41)
Albumin: 4.1 g/dL (ref 3.5–5.0)
Alkaline Phosphatase: 63 U/L (ref 38–126)
Anion gap: 8 (ref 5–15)
BUN: 12 mg/dL (ref 8–23)
CO2: 27 mmol/L (ref 22–32)
Calcium: 9 mg/dL (ref 8.9–10.3)
Chloride: 103 mmol/L (ref 98–111)
Creatinine, Ser: 0.77 mg/dL (ref 0.44–1.00)
GFR, Estimated: >60 mL/min (ref >60)
Glucose, Bld: 168 mg/dL — ABNORMAL HIGH (ref 70–99)
Potassium: 4 mmol/L (ref 3.5–5.1)
Sodium: 138 mmol/L (ref 135–145)
Total Bilirubin: 1.4 mg/dL — ABNORMAL HIGH (ref 0.3–1.2)
Total Protein: 7.3 g/dL (ref 6.5–8.1)

## 2020-12-19 LAB — PROTIME-INR
INR: 1.1 (ref 0.8–1.2)
Prothrombin Time: 13.3 seconds (ref 11.4–15.2)

## 2020-12-19 LAB — HEPATITIS C ANTIBODY: HCV Ab: NONREACTIVE

## 2020-12-19 LAB — HEPATITIS A ANTIBODY, TOTAL: hep A Total Ab: NONREACTIVE

## 2020-12-19 LAB — IRON AND TIBC
Iron: 148 ug/dL (ref 28–170)
Saturation Ratios: 41 % — ABNORMAL HIGH (ref 10.4–31.8)
TIBC: 364 ug/dL (ref 250–450)
UIBC: 216 ug/dL

## 2020-12-19 LAB — HEPATITIS B CORE ANTIBODY, TOTAL: Hep B Core Total Ab: NONREACTIVE

## 2020-12-19 LAB — HEPATITIS B SURFACE ANTIGEN: Hepatitis B Surface Ag: NONREACTIVE

## 2020-12-20 LAB — HEPATITIS B SURFACE ANTIBODY, QUANTITATIVE: Hep B S AB Quant (Post): 5.8 m[IU]/mL — ABNORMAL LOW (ref >9.9)

## 2020-12-21 ENCOUNTER — Telehealth: Payer: Self-pay | Admitting: Internal Medicine

## 2020-12-21 ENCOUNTER — Telehealth: Payer: Self-pay

## 2020-12-21 DIAGNOSIS — E119 Type 2 diabetes mellitus without complications: Secondary | ICD-10-CM

## 2020-12-21 DIAGNOSIS — E1169 Type 2 diabetes mellitus with other specified complication: Secondary | ICD-10-CM

## 2020-12-21 DIAGNOSIS — E039 Hypothyroidism, unspecified: Secondary | ICD-10-CM

## 2020-12-21 NOTE — Telephone Encounter (Signed)
Pt returning call. [phone]

## 2020-12-21 NOTE — Telephone Encounter (Signed)
[facility] has 6 month follow up on 04/29 and needs blood work ordered   Pt call back [phone]

## 2020-12-21 NOTE — Telephone Encounter (Signed)
Opened in error

## 2020-12-21 NOTE — Telephone Encounter (Signed)
Last labs completed by [place] 08/05/20 T4, TSH, A1C, BMET, Hepatic and Lipid. Please advise. Thank you  (Pt has had multiple labs completed through hospital)

## 2020-12-22 ENCOUNTER — Other Ambulatory Visit: Payer: Self-pay

## 2020-12-22 DIAGNOSIS — D509 Iron deficiency anemia, unspecified: Secondary | ICD-10-CM

## 2020-12-22 DIAGNOSIS — Z79899 Other long term (current) drug therapy: Secondary | ICD-10-CM

## 2020-12-22 NOTE — Telephone Encounter (Signed)
Call was addressed. See other phone note.

## 2020-12-23 NOTE — Telephone Encounter (Signed)
Lipid, TSH, A1c-hyperlipidemia diabetes hypothyroidism

## 2020-12-23 NOTE — Telephone Encounter (Signed)
Lab orders placed and pt is aware 

## 2020-12-26 ENCOUNTER — Other Ambulatory Visit: Payer: Self-pay

## 2020-12-26 DIAGNOSIS — R17 Unspecified jaundice: Secondary | ICD-10-CM

## 2020-12-26 DIAGNOSIS — Z79899 Other long term (current) drug therapy: Secondary | ICD-10-CM

## 2021-01-20 DIAGNOSIS — R070 Pain in throat: Secondary | ICD-10-CM | POA: Diagnosis not present

## 2021-01-20 DIAGNOSIS — R058 Other specified cough: Secondary | ICD-10-CM | POA: Diagnosis not present

## 2021-01-23 ENCOUNTER — Other Ambulatory Visit: Payer: Self-pay

## 2021-01-23 MED ORDER — ACYCLOVIR 400 MG PO TABS: 400.0000 mg | ORAL_TABLET | ORAL | 4 refills | Status: DC | PRN

## 2021-01-25 ENCOUNTER — Telehealth: Payer: Self-pay | Admitting: Obstetrics & Gynecology

## 2021-01-25 NOTE — Telephone Encounter (Signed)
Patient called to give Dr.Eure, [employer]'s fax number([phone]) to send over the acyclovir (ZOVIRAX) 400 MG tablet prescription.

## 2021-01-25 NOTE — Telephone Encounter (Signed)
Rx sent to [facility] by Dr. Elonda Husky.

## 2021-02-03 DIAGNOSIS — R17 Unspecified jaundice: Secondary | ICD-10-CM | POA: Diagnosis not present

## 2021-02-03 DIAGNOSIS — Z79899 Other long term (current) drug therapy: Secondary | ICD-10-CM | POA: Diagnosis not present

## 2021-02-03 DIAGNOSIS — E119 Type 2 diabetes mellitus without complications: Secondary | ICD-10-CM | POA: Diagnosis not present

## 2021-02-03 DIAGNOSIS — E039 Hypothyroidism, unspecified: Secondary | ICD-10-CM | POA: Diagnosis not present

## 2021-02-03 DIAGNOSIS — E1169 Type 2 diabetes mellitus with other specified complication: Secondary | ICD-10-CM | POA: Diagnosis not present

## 2021-02-03 DIAGNOSIS — E785 Hyperlipidemia, unspecified: Secondary | ICD-10-CM | POA: Diagnosis not present

## 2021-02-04 LAB — LIPID PANEL
Chol/HDL Ratio: 4.7 ratio — ABNORMAL HIGH (ref 0.0–4.4)
Cholesterol, Total: 179 mg/dL (ref 100–199)
HDL: 38 mg/dL — ABNORMAL LOW (ref >39)
LDL Chol Calc (NIH): 104 mg/dL — ABNORMAL HIGH (ref 0–99)
Triglycerides: 215 mg/dL — ABNORMAL HIGH (ref 0–149)
VLDL Cholesterol Cal: 37 mg/dL (ref 5–40)

## 2021-02-04 LAB — BILIRUBIN, FRACTIONATED(TOT/DIR/INDIR)
Bilirubin Total: 0.9 mg/dL (ref 0.0–1.2)
Bilirubin, Direct: 0.17 mg/dL (ref 0.00–0.40)
Bilirubin, Indirect: 0.73 mg/dL (ref 0.10–0.80)

## 2021-02-04 LAB — TSH: TSH: 1.5 u[IU]/mL (ref 0.450–4.500)

## 2021-02-04 LAB — HEMOGLOBIN A1C
Est. average glucose Bld gHb Est-mCnc: 180 mg/dL
Hgb A1c MFr Bld: 7.9 % — ABNORMAL HIGH (ref 4.8–5.6)

## 2021-02-07 ENCOUNTER — Other Ambulatory Visit: Payer: Self-pay | Admitting: Family Medicine

## 2021-02-10 ENCOUNTER — Other Ambulatory Visit: Payer: Self-pay

## 2021-02-10 ENCOUNTER — Telehealth: Payer: Self-pay

## 2021-02-10 ENCOUNTER — Ambulatory Visit (INDEPENDENT_AMBULATORY_CARE_PROVIDER_SITE_OTHER): Payer: Medicare HMO | Admitting: Family Medicine

## 2021-02-10 VITALS — BP 138/68 | Temp 97.4°F | Wt 135.6 lb

## 2021-02-10 DIAGNOSIS — M5442 Lumbago with sciatica, left side: Secondary | ICD-10-CM

## 2021-02-10 DIAGNOSIS — E119 Type 2 diabetes mellitus without complications: Secondary | ICD-10-CM

## 2021-02-10 DIAGNOSIS — E039 Hypothyroidism, unspecified: Secondary | ICD-10-CM

## 2021-02-10 DIAGNOSIS — K219 Gastro-esophageal reflux disease without esophagitis: Secondary | ICD-10-CM | POA: Diagnosis not present

## 2021-02-10 DIAGNOSIS — K760 Fatty (change of) liver, not elsewhere classified: Secondary | ICD-10-CM

## 2021-02-10 DIAGNOSIS — E785 Hyperlipidemia, unspecified: Secondary | ICD-10-CM | POA: Diagnosis not present

## 2021-02-10 DIAGNOSIS — E1169 Type 2 diabetes mellitus with other specified complication: Secondary | ICD-10-CM | POA: Diagnosis not present

## 2021-02-10 DIAGNOSIS — G8929 Other chronic pain: Secondary | ICD-10-CM

## 2021-02-10 MED ORDER — PANTOPRAZOLE SODIUM 40 MG PO TBEC: 40.0000 mg | DELAYED_RELEASE_TABLET | Freq: Every day | ORAL | 1 refills | Status: DC

## 2021-02-10 MED ORDER — METFORMIN HCL 500 MG PO TABS: ORAL_TABLET | ORAL | 1 refills | Status: DC

## 2021-02-10 MED ORDER — LOSARTAN POTASSIUM 50 MG PO TABS: 50.0000 mg | ORAL_TABLET | Freq: Every day | ORAL | 1 refills | Status: DC

## 2021-02-10 MED ORDER — GLIPIZIDE ER 5 MG PO TB24: ORAL_TABLET | ORAL | 1 refills | Status: DC

## 2021-02-10 MED ORDER — LEVOTHYROXINE SODIUM 88 MCG PO TABS: ORAL_TABLET | ORAL | 1 refills | Status: DC

## 2021-02-10 NOTE — Progress Notes (Signed)
   Subjective:    Patient ID: Nicole Lam, female    DOB: [birth year], 70 y.o.   MRN: [id number]  HPI Pt here for follow up. Not checking sugars regular but does every once in while.  Patient takes her cholesterol medicine regular basis but does not tolerate higher doses Patient does take her diabetes medicine states her diet could be a little bit better she states her husband fixes a food we did discuss healthy eating She does have a lot of chronic back pain and discomfort and uses hydrocodone sparingly does not abuse it Has hypothyroidism takes her thyroid medicine on a regular basis Acid reflux pantoprazole helps keep this under control  Review of Systems    See above Objective:   Physical Exam Vitals reviewed.  Constitutional:      General: She is not in acute distress. HENT:     Head: Normocephalic and atraumatic.  Eyes:     General:        Right eye: No discharge.        Left eye: No discharge.  Neck:     Trachea: No tracheal deviation.  Cardiovascular:     Rate and Rhythm: Normal rate and regular rhythm.     Heart sounds: Normal heart sounds. No murmur heard.   Pulmonary:     Effort: Pulmonary effort is normal. No respiratory distress.     Breath sounds: Normal breath sounds.  Lymphadenopathy:     Cervical: No cervical adenopathy.  Skin:    General: Skin is warm and dry.  Neurological:     Mental Status: She is alert.     Coordination: Coordination normal.  Psychiatric:        Behavior: Behavior normal.     Patient still works on a regular basis      Assessment & Plan:  1. Hypothyroidism, unspecified type Thyroid decent control takes medication watch diet labs reviewed  2. Hyperlipidemia associated with type 2 diabetes mellitus ([place]) Does not tolerate higher dose of statin stick with current dose watch diet stay active  3. Type 2 diabetes mellitus without complication, without long-term current use of insulin ([facility]) Adjust metformin twice daily  continue the glipizide of 5 mg healthier eating recheck A1c again on 4  4. Gastroesophageal reflux disease, unspecified whether esophagitis present Reflux decent control but has history of esophagitis continue PPI  5. Fatty liver Fatty liver watch diet stay active try to keep weight and check 1 6. Chronic bilateral low back pain with left-sided sciatica Chronic low back pain hydrocodone sparingly use otherwise use Tylenol stretching Recheck within 6 months

## 2021-02-10 NOTE — Telephone Encounter (Signed)
error 

## 2021-02-11 MED ORDER — HYDROCODONE-ACETAMINOPHEN 7.5-325 MG PO TABS: ORAL_TABLET | ORAL | 0 refills | Status: DC

## 2021-02-22 ENCOUNTER — Other Ambulatory Visit: Payer: Self-pay

## 2021-02-22 DIAGNOSIS — Z79899 Other long term (current) drug therapy: Secondary | ICD-10-CM

## 2021-02-22 DIAGNOSIS — D509 Iron deficiency anemia, unspecified: Secondary | ICD-10-CM

## 2021-03-10 ENCOUNTER — Telehealth: Payer: Self-pay | Admitting: Family Medicine

## 2021-03-10 NOTE — Telephone Encounter (Signed)
I spoke to patient's husband and patient was at work. Patient works M-F until after 4:00. I'll try to call back another time. If patient calls back, please schedule Medicare Annual Wellness Visit (AWV) in office.   If not able to come in office, please offer to do virtually or by telephone.   Last AWV: 10/20/2018  Please schedule at anytime with Nurse Health Advisor.

## 2021-04-20 ENCOUNTER — Telehealth: Payer: Self-pay | Admitting: Family Medicine

## 2021-04-20 NOTE — Telephone Encounter (Signed)
FYI- patient is calling checking on appointment for 7/12 for Nicole Lam medicare  wellness at 3:50  by Tami we normally dont schedule these .So advise on what to do with this appointment  is it by phone or in person

## 2021-04-20 NOTE — Telephone Encounter (Signed)
The annual wound wellness visit can be either way it can either be in person or by phone it is more of a review of the health and wellness

## 2021-04-21 NOTE — Telephone Encounter (Signed)
Pt would like to do phone visit.  Pt husband contacted. Pt does not get off of work until 4 pm. Informed husband that we would call her and go over everything before hand.

## 2021-04-25 ENCOUNTER — Ambulatory Visit (HOSPITAL_COMMUNITY)
Admission: RE | Admit: 2021-04-25 | Discharge: 2021-04-25 | Disposition: A | Discharge status: Home / Self Care | Payer: Medicare HMO | Source: Ambulatory Visit | Attending: Family Medicine | Admitting: Family Medicine

## 2021-04-25 ENCOUNTER — Ambulatory Visit (INDEPENDENT_AMBULATORY_CARE_PROVIDER_SITE_OTHER): Payer: Medicare HMO | Admitting: Family Medicine

## 2021-04-25 ENCOUNTER — Other Ambulatory Visit: Payer: Self-pay

## 2021-04-25 VITALS — BP 124/72 | HR 81 | Ht <= 58 in | Wt 135.0 lb

## 2021-04-25 DIAGNOSIS — Z0000 Encounter for general adult medical examination without abnormal findings: Secondary | ICD-10-CM

## 2021-04-25 DIAGNOSIS — Z0001 Encounter for general adult medical examination with abnormal findings: Secondary | ICD-10-CM | POA: Diagnosis not present

## 2021-04-25 DIAGNOSIS — E785 Hyperlipidemia, unspecified: Secondary | ICD-10-CM | POA: Diagnosis not present

## 2021-04-25 DIAGNOSIS — R2242 Localized swelling, mass and lump, left lower limb: Secondary | ICD-10-CM | POA: Diagnosis not present

## 2021-04-25 DIAGNOSIS — K760 Fatty (change of) liver, not elsewhere classified: Secondary | ICD-10-CM

## 2021-04-25 DIAGNOSIS — M06362 Rheumatoid nodule, left knee: Secondary | ICD-10-CM | POA: Diagnosis not present

## 2021-04-25 DIAGNOSIS — E1169 Type 2 diabetes mellitus with other specified complication: Secondary | ICD-10-CM | POA: Diagnosis not present

## 2021-04-25 DIAGNOSIS — E119 Type 2 diabetes mellitus without complications: Secondary | ICD-10-CM

## 2021-04-25 DIAGNOSIS — E039 Hypothyroidism, unspecified: Secondary | ICD-10-CM | POA: Diagnosis not present

## 2021-04-25 DIAGNOSIS — D219 Benign neoplasm of connective and other soft tissue, unspecified: Secondary | ICD-10-CM

## 2021-04-25 MED ORDER — HYDROCODONE-ACETAMINOPHEN 7.5-325 MG PO TABS: ORAL_TABLET | ORAL | 0 refills | Status: DC

## 2021-04-25 MED ORDER — ATORVASTATIN CALCIUM 20 MG PO TABS: 20.0000 mg | ORAL_TABLET | Freq: Every day | ORAL | 0 refills | Status: DC

## 2021-04-25 NOTE — Progress Notes (Signed)
   Subjective:    Patient ID: Nicole Lam, female    DOB: 04-10-1951, 70 y.o.   MRN: [id number]  HPI  The patient comes in today for a wellness visit.  Mini Cog- Pass  A review of their health history was completed.  A review of medications was also completed.  Any needed refills; no  Eating habits: trying to  Falls/  MVA accidents in past few months: no  Regular exercise: lots of walking at work  Specialist pt sees on regular basis: Dr Gala Romney every 6 months  Preventative health issues were discussed.   Additional concerns: none   Review of Systems     Objective:   Physical Exam  General-in no acute distress Eyes-no discharge Lungs-respiratory rate normal, CTA CV-no murmurs,RRR Extremities skin warm dry no edema Neuro grossly normal Behavior normal, alert       Assessment & Plan:  S Adult wellness-complete.wellness physical was conducted today. Importance of diet and exercise were discussed in detail.  In addition to this a discussion regarding safety was also covered. We also reviewed over immunizations and gave recommendations regarding current immunization needed for age.  In addition to this additional areas were also touched on including: Preventative health exams needed:  Colonoscopy 2031  Patient was advised yearly wellness exam

## 2021-04-25 NOTE — Patient Instructions (Signed)

## 2021-06-12 DIAGNOSIS — D509 Iron deficiency anemia, unspecified: Secondary | ICD-10-CM | POA: Diagnosis not present

## 2021-06-12 DIAGNOSIS — Z79899 Other long term (current) drug therapy: Secondary | ICD-10-CM | POA: Diagnosis not present

## 2021-06-13 LAB — CBC WITH DIFFERENTIAL/PLATELET
Basophils Absolute: 0.1 10*3/uL (ref 0.0–0.2)
Basos: 1 %
EOS (ABSOLUTE): 0.3 10*3/uL (ref 0.0–0.4)
Eos: 5 %
Hematocrit: 39.5 % (ref 34.0–46.6)
Hemoglobin: 12.9 g/dL (ref 11.1–15.9)
Immature Grans (Abs): 0 10*3/uL (ref 0.0–0.1)
Immature Granulocytes: 1 %
Lymphocytes Absolute: 2.5 10*3/uL (ref 0.7–3.1)
Lymphs: 40 %
MCH: 29.9 pg (ref 26.6–33.0)
MCHC: 32.7 g/dL (ref 31.5–35.7)
MCV: 92 fL (ref 79–97)
Monocytes Absolute: 0.4 10*3/uL (ref 0.1–0.9)
Monocytes: 6 %
Neutrophils Absolute: 3 10*3/uL (ref 1.4–7.0)
Neutrophils: 47 %
Platelets: 267 10*3/uL (ref 150–450)
RBC: 4.31 x10E6/uL (ref 3.77–5.28)
RDW: 13.4 % (ref 11.7–15.4)
WBC: 6.3 10*3/uL (ref 3.4–10.8)

## 2021-06-13 LAB — IRON,TIBC AND FERRITIN PANEL
Ferritin: 27 ng/mL (ref 15–150)
Iron Saturation: 32 % (ref 15–55)
Iron: 103 ug/dL (ref 27–139)
Total Iron Binding Capacity: 321 ug/dL (ref 250–450)
UIBC: 218 ug/dL (ref 118–369)

## 2021-06-14 ENCOUNTER — Other Ambulatory Visit: Payer: Self-pay | Admitting: Obstetrics & Gynecology

## 2021-06-19 NOTE — Progress Notes (Signed)
Referring Provider: Kathyrn Drown, MD Primary Care Physician:  Kathyrn Drown, MD Primary GI Physician: Dr. Gala Romney  Chief Complaint  Patient presents with   IDA    No bleeding or dark stool   [name]    HPI:   Nicole Lam is a 70 y.o. female presenting today for follow-up with history of IDA, dysphagia with Schatzki's ring s/p dilation June 2021, constipation, suspected [name] with mildly elevated LFTs and Medicare F2/F3 in 2017, undergoing yearly [place].   Ultrasound abdomen with elastography March 2022 with increased liver parenchymal echogenicity without mention of nodularity.  No focal liver lesion.  Spleen size normal.  Median kPa 3.8 with high probability of being normal.Extensive serologies in the past including hepatitis B, hepatitis C, smooth muscle antibody, mitochondrial antibody, ANA, immunoglobulins, ceruloplasmin, TTG IgA all negative. LFTs normalized October 2021.   EGD and colonoscopy June 2021 for IDA with 1 tubular adenoma, small Schatzki's ring s/p dilation, small hiatal hernia, otherwise normal exam. Patient unable to swallow Givens capsule.  Offered endoscopic placement patency capsule followed by endoscopic placement of Givens capsule if appropriate, but patient declined.  Also previously offered CT enterography, but patient also declined due to contrast causing nausea.   Last seen in our office 12/15/2020.  Denied overt GI bleeding.  Still taking iron daily.  States she would never have Givens capsule and preferred to hold off on CT enterography.  Continue with Protonix at this previously helped with reported dysphagia symptoms.  Denies history of GERD and had no significant upper GI symptoms.  Constipation well managed with MiraLAX daily.  Noted normalization of LFTs in October 2021.  Denied medication changes.  No weight loss.  Hemoglobin A1c improved to 6.7.  No signs or symptoms of decompensated liver disease.  Plan to update CBC and iron panel, [name] labs, ultrasound with  elastography, continue current medications.  [place] results as per above.   Labs completed 12/19/2020: Hemoglobin 13.4, platelets within normal limits, LFTs within normal limits, T bili mildly elevated at 1.4. Iron 148, saturation 41% (H), ferritin 23.  Hepatitis A antibody, hepatitis B serologies negative with inconsistent immunity, hepatitis C antibody negative. *Iron was discontinued with plans to monitor.  Repeat fractionated bilirubin 02/03/2021 with total bilirubin returned to normal at 0.9, direct bilirubin 0.17, indirect 0.73.  Repeat labs 06/12/2021 with ferritin 27, saturation 32%, iron 103, hemoglobin 12.9.    Today:  Anemia:  Denies BRBPR, melena, or any other obvious bleeding.  No NSAIDs aside from Voltaren gel.  [name]:  Denies swelling in her abdomen or lower extremities, changes in mental status, confusion, yellowing of the eyes or skin, bruising/bleeding. Drinking black coffee daily. Hg A1C: Increased to 7.9 on 4/22.  Reports glipizide was adjusted.  Has follow-up with PCP in October for recheck. Triglycerides elevated at 215, LDL 104, HDL 38 on 4/22.  Unable to tolerate higher dose of statins. LFTs due in March 2023.  [place] due in March 2023.    No GERD symptoms, dysphagia, abdominal pain, nausea, vomiting. On Pantoprazole 40 mg daily.   MiraLAX seems to cause watery stools at times.  Up to 3 BMs per day with MiraLAX.  If she skips MiraLAX, she gets constipated.   Past Medical History:  Diagnosis Date   Diabetes ([place])    Fatty liver    with mild LFT elevation, F2/F3 2017   Hemorrhoid    Hyperlipidemia    Hypertension    Hypothyroidism    Lipoma of neck  Past Surgical History:  Procedure Laterality Date   ABDOMINAL HYSTERECTOMY     AGILE CAPSULE N/A 06/14/2020   Procedure: AGILE CAPSULE;  Surgeon: Daneil Dolin, MD; unable to swallow agile   APPENDECTOMY     COLONOSCOPY  05/30/05   ERD:EYCXKG anal papilla, otherwise normal rectum.and colon   COLONOSCOPY N/A  11/04/2015   RMR: normal colonoscopy   COLONOSCOPY WITH PROPOFOL N/A 04/07/2020   Procedure: COLONOSCOPY WITH PROPOFOL;  Surgeon: Daneil Dolin, MD; One 4 mm polyp at ileocecal valve, otherwise normal exam.  Patient was heme-negative.  Pathology with tubular adenoma.  No recommendations to repeat colonoscopy.   DILATION AND CURETTAGE OF UTERUS     ESOPHAGEAL DILATION  04/07/2020   Procedure: ESOPHAGEAL DILATION;  Surgeon: Daneil Dolin, MD;  Location: [facility];  Service: Endoscopy;;   ESOPHAGOGASTRODUODENOSCOPY (EGD) WITH PROPOFOL N/A 04/07/2020   Procedure: ESOPHAGOGASTRODUODENOSCOPY (EGD) WITH PROPOFOL;  Surgeon: Daneil Dolin, MD;  Small Schatzki's ring s/p dilated, small hiatal hernia, otherwise normal exam.   POLYPECTOMY  04/07/2020   Procedure: POLYPECTOMY;  Surgeon: Daneil Dolin, MD;  Location: [facility];  Service: Endoscopy;;    Current Outpatient Medications  Medication Sig Dispense Refill   acyclovir (ZOVIRAX) 400 MG tablet Take 1 tablet (400 mg total) by mouth as needed. As needed 90 tablet 4   atorvastatin (LIPITOR) 20 MG tablet Take 1 tablet (20 mg total) by mouth daily. 90 tablet 0   Blood Glucose Monitoring Suppl (TRUE METRIX METER) w/Device KIT USE AS DIRECTED 1 kit 0   diclofenac sodium (VOLTAREN) 1 % GEL Apply 4 g topically 4 (four) times daily. (Patient taking differently: Apply 4 g topically 2 (two) times daily as needed (pain).) 5 Tube 5   estradiol (ESTRACE) 0.5 MG tablet TAKE 1 TABLET EVERY DAY 90 tablet 11   glipiZIDE (GLUCOTROL XL) 5 MG 24 hr tablet Take one tablet po each day 90 tablet 1   HYDROcodone-acetaminophen (NORCO) 7.5-325 MG tablet 1 q4 hours prn 30 tablet 0   levothyroxine (SYNTHROID) 88 MCG tablet Take 1/2 tablet po on Monday and Fridays; take one whole tab all other days 90 tablet 1   loratadine (CLARITIN) 10 MG tablet Take 10 mg by mouth daily.     losartan (COZAAR) 50 MG tablet Take 1 tablet (50 mg total) by mouth daily. 90 tablet 1    metFORMIN (GLUCOPHAGE) 500 MG tablet Take 2 tablets in the morning and 2 tablets in the evening 360 tablet 1   Multiple Vitamins-Minerals (MULTIVITAMIN WITH MINERALS) tablet Take 1 tablet by mouth daily.     pantoprazole (PROTONIX) 40 MG tablet Take 1 tablet (40 mg total) by mouth daily. 90 tablet 1   phenylephrine-shark liver oil-mineral oil-petrolatum (PREPARATION H) 0.25-3-14-71.9 % rectal ointment Place 1 application rectally as needed for hemorrhoids.     polyethylene glycol (MIRALAX / GLYCOLAX) packet Take 17 g by mouth daily.      TRUE METRIX BLOOD GLUCOSE TEST test strip TEST BLOOD SUGAR EVERY DAY 100 strip 0   No current facility-administered medications for this visit.    Allergies as of 06/21/2021   (No Known Allergies)    Family History  Problem Relation Age of Onset   Heart disease Mother    Hypertension Mother    Birth defects Daughter    Colon cancer Neg Hx    Liver disease Neg Hx    Colon polyps Neg Hx     Social History   Socioeconomic History  Marital status: Married    Spouse name: Not on file   Number of children: Not on file   Years of education: Not on file   Highest education level: Not on file  Occupational History   Not on file  Tobacco Use   Smoking status: Never   Smokeless tobacco: Never  Substance and Sexual Activity   Alcohol use: No    Alcohol/week: 0.0 standard drinks   Drug use: No   Sexual activity: Yes    Birth control/protection: Surgical    Comment: hyst  Other Topics Concern   Not on file  Social History Narrative   Not on file   Social Determinants of Health   Financial Resource Strain: Not on file  Food Insecurity: Not on file  Transportation Needs: Not on file  Physical Activity: Not on file  Stress: Not on file  Social Connections: Not on file    Review of Systems: Gen: Denies fever, chills, anorexia. Denies fatigue, weakness, weight loss.  CV: Denies chest pain, palpitations, syncope, peripheral edema, and  claudication. Resp: Denies dyspnea at rest, cough, wheezing, coughing up blood, and pleurisy. GI: Denies vomiting blood, jaundice, and fecal incontinence.   Denies dysphagia or odynophagia. Derm: Denies rash, itching, dry skin Psych: Denies depression, anxiety, memory loss, confusion. No homicidal or suicidal ideation.  Heme: Denies bruising, bleeding, and enlarged lymph nodes.  Physical Exam: BP 120/72   Pulse 75   Temp (!) 97.3 F (36.3 C) (Temporal)   Ht 4' 11"  (1.499 m)   Wt 133 lb 9.6 oz (60.6 kg)   BMI 26.98 kg/m  General:   Alert and oriented. No distress noted. Pleasant and cooperative.  Head:  Normocephalic and atraumatic. Eyes:  Conjuctiva clear without scleral icterus. Mouth:  Oral mucosa pink and moist. Good dentition. No lesions. Heart:  S1, S2 present without murmurs appreciated. Lungs:  Clear to auscultation bilaterally. No wheezes, rales, or rhonchi. No distress.  Abdomen:  +BS, soft, non-tender and non-distended. No rebound or guarding. No HSM or masses noted. Msk:  Symmetrical without gross deformities. Normal posture. Extremities:  Without edema. Neurologic:  Alert and  oriented x4 Psych:  Alert and cooperative. Normal mood and affect.    Assessment:  70 year old female with history of IDA, dysphagia with Schatzki's ring, constipation, suspected [name] with mildly elevated LFTs previously presenting today for routine follow-up with no acute complaints.  IDA: S/p EGD and colonoscopy June 2021 with 1 tubular adenoma, small Schatzki's ring s/p dilation, small hiatal hernia, otherwise normal exams. Patient unable to swallow Givens capsule.  Offered endoscopic placement patency capsule followed by endoscopic placement of Givens capsule if appropriate, but patient declined.  Also previously offered CT enterography, but patient also declined due to contrast causing nausea.  She was treated with oral iron. Hemoglobin returned to normal at 13.4 in March 2022 with iron panel  within normal limits.  Iron was discontinued at that time.  Most recent labs 06/12/2021 with hemoglobin fairly stable at 12.9, iron panel remains within normal limits.  She denies overt GI bleeding or any other obvious blood loss.  Remains on pantoprazole 40 mg daily.  No NSAIDs.  We will plan to continue to monitor for now.  If declining hemoglobin/iron panel, would need to reconsider further evaluation.  [name]: Previously with mildly elevated LFTs but actually normalized in October 2021, again normal in March 2022. Medicare F2/F3 in 2017, but repeat ultrasound abdomen with elastography March 2022 with fatty liver without mention of nodularity, spleen  normal, and median kPa 3.8 suggesting high probability of being normal.  She has had extensive serologies previously including hepatitis A, B, C all negative,smooth muscle antibody, mitochondrial antibody, ANA, immunoglobulins, ceruloplasmin, TTG IgA all negative.  No signs or symptoms of decompensated liver disease.  Denies alcohol use.   Discussed extensively today the need for tight glycemic control as her A1c increased to 7.9 in April.  Also discussed the need for following a strict low-fat/low-cholesterol diet and adequate exercise as her triglycerides and LDL were elevated in April. Encouraged slow weight loss as well of about 10 lbs. HLD managed by PCP, and she is unable to tolerate a higher dose of statins.  She will be due for routine monitoring of LFTs and yearly ultrasound in March 2023.  Constipation: Chronic.  MiraLAX daily seems to be a little too strong.  No alarm symptoms. Advised to stop MiraLAX and use Colace 100-200 mg daily as needed.   Plan: 1.  Repeat CBC and iron panel in 6 months (1 week prior to next visit).  2.  Patient is to monitor for BRBPR or melena and let us know if this occurs. 3.  Continue pantoprazole 40 mg daily. 4.  Counseled extensively on fatty liver including diet, exercise, tight glycemic control and written  instructions provided. 5.  Encouraged continuing black coffee daily. 6.  Due for repeat HFP in March 2023.  Requested nurse to arrange. 7.  Due for yearly ultrasound March 2023.  She is on recall. 8.  Stop MiraLAX and start Colace 100-200 mg daily as needed. 9.  Drink plenty of water and eat fresh fruits and vegetables daily. 10.  Follow-up in 6 months or sooner if needed.    Aliene Altes, PA-C [facility] 06/21/2021

## 2021-06-20 ENCOUNTER — Other Ambulatory Visit: Payer: Self-pay

## 2021-06-21 ENCOUNTER — Encounter: Payer: Self-pay | Admitting: Gastroenterology

## 2021-06-21 ENCOUNTER — Ambulatory Visit: Payer: Medicare HMO | Admitting: Gastroenterology

## 2021-06-21 ENCOUNTER — Encounter: Payer: Self-pay | Admitting: Internal Medicine

## 2021-06-21 ENCOUNTER — Other Ambulatory Visit: Payer: Self-pay

## 2021-06-21 VITALS — BP 120/72 | HR 75 | Temp 97.3°F | Ht 59.0 in | Wt 133.6 lb

## 2021-06-21 DIAGNOSIS — K7581 Nonalcoholic steatohepatitis (NASH): Secondary | ICD-10-CM | POA: Diagnosis not present

## 2021-06-21 DIAGNOSIS — D509 Iron deficiency anemia, unspecified: Secondary | ICD-10-CM | POA: Diagnosis not present

## 2021-06-21 DIAGNOSIS — K5900 Constipation, unspecified: Secondary | ICD-10-CM

## 2021-06-21 NOTE — Patient Instructions (Signed)
Stop MiraLAX and try Colace (docusate sodium) 100 mg to 200 mg daily for constipation.   Continue drinking plenty of water and eating fresh fruits and vegetables daily.  You will be due for repeat blood work to check your hemoglobin 6 months.  We will arrange this for you.  You will also be due for repeat blood work to check your liver enzymes and for an ultrasound of your liver in March 2023.  We will also arrange this for you.  Instructions for fatty liver: Recommend 1# weight loss per week until ideal body weight through exercise & diet. Low fat/cholesterol diet.   Avoid sweets, sodas, fruit juices, sweetened beverages like tea, etc. Exercise as you are able.  Recommend starting low at about 15 minutes daily and increasing slowly.  Continue to avoid alcohol Continue drinking black coffee.  Continue pantoprazole 40 mg daily.  Continue to monitor for bright red blood per rectum or black stools and let us know if this occurs.  We will see back in 6 months.  Do not hesitate to call if you have questions or concerns prior to your next visit.   It was great seeing you today!  I am glad you are doing well!  Aliene Altes, PA-C [facility]

## 2021-06-22 ENCOUNTER — Other Ambulatory Visit: Payer: Self-pay | Admitting: *Deleted

## 2021-06-23 MED ORDER — ACYCLOVIR 400 MG PO TABS: 400.0000 mg | ORAL_TABLET | ORAL | 4 refills | Status: DC | PRN

## 2021-06-26 ENCOUNTER — Other Ambulatory Visit: Payer: Self-pay | Admitting: Family Medicine

## 2021-06-28 ENCOUNTER — Other Ambulatory Visit: Payer: Self-pay | Admitting: Family Medicine

## 2021-07-04 ENCOUNTER — Other Ambulatory Visit: Payer: Self-pay

## 2021-07-04 ENCOUNTER — Ambulatory Visit (INDEPENDENT_AMBULATORY_CARE_PROVIDER_SITE_OTHER): Payer: Medicare HMO

## 2021-07-04 VITALS — Ht 59.0 in | Wt 134.0 lb

## 2021-07-04 DIAGNOSIS — Z0000 Encounter for general adult medical examination without abnormal findings: Secondary | ICD-10-CM | POA: Diagnosis not present

## 2021-07-04 NOTE — Progress Notes (Signed)
Subjective:   Nicole Lam is a 70 y.o. female who presents for Medicare Annual (Subsequent) preventive examination. Virtual Visit via Telephone Note  I connected with  Nicole Lam on 07/04/21 at  3:40 PM EDT by telephone and verified that I am speaking with the correct person using two identifiers.  Location: Patient: HOME Provider:[facility] Persons participating in the virtual visit: patient/Nurse Health Advisor   I discussed the limitations, risks, security and privacy concerns of performing an evaluation and management service by telephone and the availability of in person appointments. The patient expressed understanding and agreed to proceed.  Interactive audio and video telecommunications were attempted between this nurse and patient, however failed, due to patient having technical difficulties OR patient did not have access to video capability.  We continued and completed visit with audio only.  Some vital signs may be absent or patient reported.   Chriss Driver, LPN  Review of Systems     Cardiac Risk Factors include: advanced age (>42mn, >>70women);diabetes mellitus;dyslipidemia;sedentary lifestyle     Objective:    Today's Vitals   07/04/21 1538 07/04/21 1540  Weight: 134 lb (60.8 kg)   Height: 4' 11"  (1.499 m)   PainSc:  7    Body mass index is 27.06 kg/m.  Advanced Directives 07/04/2021 04/07/2020 04/04/2020 07/15/2017 05/28/2016 03/21/2016 11/04/2015  Does Patient Have a Medical Advance Directive? No No No No No No No  Would patient like information on creating a medical advance directive? No - Patient declined No - Patient declined No - Patient declined No - Patient declined No - patient declined information No - patient declined information No - patient declined information    Current Medications (verified) Outpatient Encounter Medications as of 07/04/2021  Medication Sig   acyclovir (ZOVIRAX) 400 MG tablet Take 1 tablet (400 mg total) by mouth as needed.  As needed   atorvastatin (LIPITOR) 20 MG tablet TAKE 1 TABLET (20 MG TOTAL) BY MOUTH DAILY.   Blood Glucose Monitoring Suppl (TRUE METRIX METER) w/Device KIT USE AS DIRECTED   diclofenac sodium (VOLTAREN) 1 % GEL Apply 4 g topically 4 (four) times daily. (Patient taking differently: Apply 4 g topically 2 (two) times daily as needed (pain).)   estradiol (ESTRACE) 0.5 MG tablet TAKE 1 TABLET EVERY DAY   glipiZIDE (GLUCOTROL XL) 5 MG 24 hr tablet Take one tablet po each day   HYDROcodone-acetaminophen (NORCO) 7.5-325 MG tablet 1 q4 hours prn   levothyroxine (SYNTHROID) 88 MCG tablet Take 1/2 tablet po on Monday and Fridays; take one whole tab all other days   losartan (COZAAR) 50 MG tablet Take 1 tablet (50 mg total) by mouth daily.   metFORMIN (GLUCOPHAGE) 500 MG tablet Take 2 tablets in the morning and 2 tablets in the evening   Multiple Vitamins-Minerals (MULTIVITAMIN WITH MINERALS) tablet Take 1 tablet by mouth daily.   pantoprazole (PROTONIX) 40 MG tablet Take 1 tablet (40 mg total) by mouth daily.   phenylephrine-shark liver oil-mineral oil-petrolatum (PREPARATION H) 0.25-3-14-71.9 % rectal ointment Place 1 application rectally as needed for hemorrhoids.   polyethylene glycol (MIRALAX / GLYCOLAX) packet Take 17 g by mouth daily.    TRUE METRIX BLOOD GLUCOSE TEST test strip TEST BLOOD SUGAR EVERY DAY   TRUEplus Lancets 33G MISC TEST ONE TO TWO TIMES DAILY AS NEEDED DUE TO FLUCTUATING GLUCOSE   loratadine (CLARITIN) 10 MG tablet Take 10 mg by mouth daily. (Patient not taking: Reported on 07/04/2021)   No facility-administered  encounter medications on file as of 07/04/2021.    Allergies (verified) Patient has no known allergies.   History: Past Medical History:  Diagnosis Date   Diabetes ([place])    Fatty liver    with mild LFT elevation, F2/F3 2017   Hemorrhoid    Hyperlipidemia    Hypertension    Hypothyroidism    Lipoma of neck    Past Surgical History:  Procedure Laterality Date    ABDOMINAL HYSTERECTOMY     AGILE CAPSULE N/A 06/14/2020   Procedure: AGILE CAPSULE;  Surgeon: Daneil Dolin, MD; unable to swallow agile   APPENDECTOMY     COLONOSCOPY  05/30/05   MBT:DHRCBU anal papilla, otherwise normal rectum.and colon   COLONOSCOPY N/A 11/04/2015   RMR: normal colonoscopy   COLONOSCOPY WITH PROPOFOL N/A 04/07/2020   Procedure: COLONOSCOPY WITH PROPOFOL;  Surgeon: Daneil Dolin, MD; One 4 mm polyp at ileocecal valve, otherwise normal exam.  Patient was heme-negative.  Pathology with tubular adenoma.  No recommendations to repeat colonoscopy.   DILATION AND CURETTAGE OF UTERUS     ESOPHAGEAL DILATION  04/07/2020   Procedure: ESOPHAGEAL DILATION;  Surgeon: Daneil Dolin, MD;  [facility]: [facility];  Service: Endoscopy;;   ESOPHAGOGASTRODUODENOSCOPY (EGD) WITH PROPOFOL N/A 04/07/2020   Procedure: ESOPHAGOGASTRODUODENOSCOPY (EGD) WITH PROPOFOL;  Surgeon: Daneil Dolin, MD;  Small Schatzki's ring s/p dilated, small hiatal hernia, otherwise normal exam.   POLYPECTOMY  04/07/2020   Procedure: POLYPECTOMY;  Surgeon: Daneil Dolin, MD;  [facility]: [facility];  Service: Endoscopy;;   Family History  Problem Relation Age of Onset   Heart disease Mother    Hypertension Mother    Birth defects Daughter    Colon cancer Neg Hx    Liver disease Neg Hx    Colon polyps Neg Hx    Social History   Socioeconomic History   Marital status: Married    Spouse name: Holiday representative   Number of children: 4   Years of education: Not on file   Highest education level: Not on file  Occupational History   Not on file  Tobacco Use   Smoking status: Never   Smokeless tobacco: Never  Substance and Sexual Activity   Alcohol use: No    Alcohol/week: 0.0 standard drinks   Drug use: No   Sexual activity: Yes    Birth control/protection: Surgical    Comment: hyst  Other Topics Concern   Not on file  Social History Narrative   Total of 4 kids between pt and husband. 3 living.     Married x 28 years.    Social Determinants of Health   Financial Resource Strain: Low Risk    Difficulty of Paying Living Expenses: Not hard at all  Food Insecurity: No Food Insecurity   Worried About Charity fundraiser in the Last Year: Never true   [place] in the Last Year: Never true  Transportation Needs: No Transportation Needs   Lack of Transportation (Medical): No   Lack of Transportation (Non-Medical): No  Physical Activity: Inactive   Days of Exercise per Week: 0 days   Minutes of Exercise per Session: 0 min  Stress: No Stress Concern Present   Feeling of Stress : Not at all  Social Connections: Moderately Integrated   Frequency of Communication with Friends and Family: Twice a week   Frequency of Social Gatherings with Friends and Family: Never   Attends Religious Services: More than 4 times per  year   Active Member of Clubs or Organizations: Yes   Attends Music therapist: More than 4 times per year   Marital Status: Married    Tobacco Counseling Counseling given: Not Answered   Clinical Intake:  Pre-visit preparation completed: Yes  Pain : 0-10 Pain Score: 7  Pain Type: Chronic pain Pain Location: Back Pain Descriptors / Indicators: Aching Pain Onset: More than a month ago Pain Frequency: Intermittent     BMI - recorded: 27.06 Nutritional Status: BMI 25 -29 Overweight Nutritional Risks: None (Pt states she fluctuates from 134-137#) Diabetes: Yes  How often do you need to have someone help you when you read instructions, pamphlets, or other written materials from your doctor or pharmacy?: 1 - Never  Diabetic?Nutrition Risk Assessment:  Has the patient had any N/V/D within the last 2 months?  No  Does the patient have any non-healing wounds?  No  Has the patient had any unintentional weight loss or weight gain?  No   Diabetes:  Is the patient diabetic?  Yes  If diabetic, was a CBG obtained today?  No  Did the patient bring  in their glucometer from home?  No  How often do you monitor your CBG's? 2-3 times per week.   Financial Strains and Diabetes Management:  Are you having any financial strains with the device, your supplies or your medication? No .  Does the patient want to be seen by Chronic Care Management for management of their diabetes?  No  Would the patient like to be referred to a Nutritionist or for Diabetic Management?  No   Diabetic Exams:  Diabetic Eye Exam: Completed 11/25/20. Pt has been advised about the importance in completing this exam.   Diabetic Foot Exam: Completed 04/25/2021. Pt has been advised about the importance in completing this exam.   Interpreter Needed?: No  Information entered by :: Randal Buba, LPN   Activities of Daily Living In your present state of health, do you have any difficulty performing the following activities: 07/04/2021  Hearing? N  Vision? N  Difficulty concentrating or making decisions? N  Walking or climbing stairs? N  Dressing or bathing? N  Doing errands, shopping? N  Preparing Food and eating ? N  Using the Toilet? N  In the past six months, have you accidently leaked urine? Y  Comment Pt states she does have to wear pantyliners because she will leak at times.  Do you have problems with loss of bowel control? N  Managing your Medications? N  Managing your Finances? N  Housekeeping or managing your Housekeeping? N  Some recent data might be hidden    Patient Care Team: Kathyrn Drown, MD as PCP - General (Family Medicine) Gala Romney, Cristopher Estimable, MD as Consulting Physician (Gastroenterology)  Indicate any recent Medical Services you may have received from other than [facility] providers in the past year (date may be approximate).     Assessment:   This is a routine wellness examination for Nicole Lam.  Hearing/Vision screen Hearing Screening - Comments:: Some hearing issues but does not wear hearing aids.  Vision Screening - Comments:: Glasses. Up  to date on eye exam. Sees [facility] in [place].  Dietary issues and exercise activities discussed: Current Exercise Habits: The patient has a physically strenuous job, but has no regular exercise apart from work., Exercise limited by: cardiac condition(s);orthopedic condition(s)   Goals Addressed             This Visit's Progress  DIET - INCREASE WATER INTAKE       Exercise 3x per week (30 min per time)         Depression Screen PHQ 2/9 Scores 07/04/2021 04/25/2021 02/10/2021 09/04/2019 12/18/2017 03/06/2016 10/22/2014  PHQ - 2 Score 0 0 0 0 0 0 0    Fall Risk Fall Risk  07/04/2021 04/25/2021 02/10/2021 08/11/2020 01/29/2020  Falls in the past year? 0 0 0 0 0  Number falls in past yr: 0 - 0 - -  Injury with Fall? 0 - 0 - -  Risk for fall due to : Other (Comment);Orthopedic patient No Fall Risks No Fall Risks - -  Follow up - Falls evaluation completed Falls evaluation completed Falls evaluation completed -    FALL RISK PREVENTION PERTAINING TO THE HOME:  Any stairs in or around the home? Yes  If so, are there any without handrails? No  Home free of loose throw rugs in walkways, pet beds, electrical cords, etc? Yes  Adequate lighting in your home to reduce risk of falls? Yes   ASSISTIVE DEVICES UTILIZED TO PREVENT FALLS:  Life alert? No  Use of a cane, walker or w/c? No  Grab bars in the bathroom? No  Shower chair or bench in shower? No  Elevated toilet seat or a handicapped toilet? Yes   TIMED UP AND GO:  Was the test performed? No . Phone visit.   Cognitive Function:     6CIT Screen 07/04/2021  What Year? 0 points  What month? 0 points  What time? 0 points  Count back from 20 0 points  Months in reverse 0 points  Repeat phrase 0 points  Total Score 0    Immunizations Immunization History  Administered Date(s) Administered   Fluad Quad(high Dose 65+) 08/11/2020   Influenza Split 07/16/2015   Influenza, High Dose Seasonal PF 08/13/2019   Influenza,inj,Quad PF,6+ Mos  08/08/2018   Influenza-Unspecified 101-Feb-1952, 08/03/2016, 08/13/2019   Moderna Sars-Covid-2 Vaccination 02/12/2020, 03/11/2020   Pneumococcal Conjugate-13 09/16/2015   Pneumococcal Polysaccharide-23 09/20/2016   Zoster, Live 10/15/2013    TDAP status: Due, Education has been provided regarding the importance of this vaccine. Advised may receive this vaccine at local pharmacy or Health Dept. Aware to provide a copy of the vaccination record if obtained from local pharmacy or Health Dept. Verbalized acceptance and understanding.  Flu Vaccine status: Due, Education has been provided regarding the importance of this vaccine. Advised may receive this vaccine at local pharmacy or Health Dept. Aware to provide a copy of the vaccination record if obtained from local pharmacy or Health Dept. Verbalized acceptance and understanding.  Pneumococcal vaccine status: Up to date  Covid-19 vaccine status: Completed vaccines  Qualifies for Shingles Vaccine? Yes   Zostavax completed Yes   Shingrix Completed?: No.    Education has been provided regarding the importance of this vaccine. Patient has been advised to call insurance company to determine out of pocket expense if they have not yet received this vaccine. Advised may also receive vaccine at local pharmacy or Health Dept. Verbalized acceptance and understanding.  Screening Tests Health Maintenance  Topic Date Due   TETANUS/TDAP  Never done   Zoster Vaccines- Shingrix (1 of 2) Never done   COVID-19 Vaccine (3 - Moderna risk series) 04/08/2020   INFLUENZA VACCINE  05/15/2021   HEMOGLOBIN A1C  08/05/2021   OPHTHALMOLOGY EXAM  11/25/2021   FOOT EXAM  04/25/2022   MAMMOGRAM  07/25/2022   COLONOSCOPY (Pts 45-39yr Insurance coverage  will need to be confirmed)  04/07/2030   DEXA SCAN  Completed   Hepatitis C Screening  Completed   HPV VACCINES  Aged Out    Health Maintenance  Health Maintenance Due  Topic Date Due   TETANUS/TDAP  Never done    Zoster Vaccines- Shingrix (1 of 2) Never done   COVID-19 Vaccine (3 - Moderna risk series) 04/08/2020   INFLUENZA VACCINE  05/15/2021    Colorectal cancer screening: Type of screening: Colonoscopy. Completed 04/07/2020. Repeat every 10 years  Mammogram status: Completed 07/25/2020. Repeat every year  Bone Density status: Completed 04/04/2017. Results reflect: Bone density results: NORMAL. Repeat every 2 years.  Lung Cancer Screening: (Low Dose CT Chest recommended if Age 57-80 years, 30 pack-year currently smoking OR have quit w/in 15years.) does not qualify.     Additional Screening:  Hepatitis C Screening: does not qualify; Completed 12/19/2020  Vision Screening: Recommended annual ophthalmology exams for early detection of glaucoma and other disorders of the eye. Is the patient up to date with their annual eye exam?  Yes  Who is the provider or what is the name of the office in which the patient attends annual eye exams? [facility] If pt is not established with a provider, would they like to be referred to a provider to establish care? No .   Dental Screening: Recommended annual dental exams for proper oral hygiene  Community Resource Referral / Chronic Care Management: CRR required this visit?  No   CCM required this visit?  No      Plan:     I have personally reviewed and noted the following in the patient's chart:   Medical and social history Use of alcohol, tobacco or illicit drugs  Current medications and supplements including opioid prescriptions.  Functional ability and status Nutritional status Physical activity Advanced directives List of other physicians Hospitalizations, surgeries, and ER visits in previous 12 months Vitals Screenings to include cognitive, depression, and falls Referrals and appointments  In addition, I have reviewed and discussed with patient certain preventive protocols, quality metrics, and best practice recommendations. A written  personalized care plan for preventive services as well as general preventive health recommendations were provided to patient.     Chriss Driver, LPN   2/75/1700   Nurse Notes: Pt states she is doing well. Up to date on all vaccines other than Flu, Shingles and Tdap. Advised pt on how to obtain vaccines. Up to date on all wellness screenings except for Bone Density, done in 2018 with normal results. Offered to schedule appointment but pt declined at this time. Encouraged pt to watch diet and increase exercise. Offered pt CCM referral to help with diabetes and nutrition but pt declined at this time.

## 2021-07-04 NOTE — Patient Instructions (Signed)
Ms. Graeff , Thank you for taking time to come for your Medicare Wellness Visit. I appreciate your ongoing commitment to your health goals. Please review the following plan we discussed and let me know if I can assist you in the future.   Screening recommendations/referrals: Colonoscopy: Done 04/07/2020, Repeat in 10 years  Mammogram: Done 07/25/2020 Repeat annually  Bone Density: Done 04/04/2017 Repeat every 2 years  Recommended yearly ophthalmology/optometry visit for glaucoma screening and checkup Recommended yearly dental visit for hygiene and checkup  Vaccinations: Influenza vaccine: Done 08/11/2020 Repeat annually  Pneumococcal vaccine: Done 09/16/2015 & 09/20/2016 Tdap vaccine: Repeat in 10 years  Shingles vaccine: Done 10/15/2013 Shingrix discussed. Please contact your pharmacy for coverage information.     Covid-19:Done 02/12/20 & 03/11/20  Advanced directives: Advance directive discussed with you today. Even though you declined this today, please call our office should you change your mind, and we can give you the proper paperwork for you to fill out.   Conditions/risks identified: Aim for 30 minutes of exercise or walking each day, drink 6-8 glasses of water and eat lots of fruits and vegetables.   Next appointment: Follow up in one year for your annual wellness visit 07/10/22 @ 3:40.   Preventive Care 88 Years and Older, Female Preventive care refers to lifestyle choices and visits with your health care provider that can promote health and wellness. What does preventive care include? A yearly physical exam. This is also called an annual well check. Dental exams once or twice a year. Routine eye exams. Ask your health care provider how often you should have your eyes checked. Personal lifestyle choices, including: Daily care of your teeth and gums. Regular physical activity. Eating a healthy diet. Avoiding tobacco and drug use. Limiting alcohol use. Practicing safe  sex. Taking low-dose aspirin every day. Taking vitamin and mineral supplements as recommended by your health care provider. What happens during an annual well check? The services and screenings done by your health care provider during your annual well check will depend on your age, overall health, lifestyle risk factors, and family history of disease. Counseling  Your health care provider may ask you questions about your: Alcohol use. Tobacco use. Drug use. Emotional well-being. Home and relationship well-being. Sexual activity. Eating habits. History of falls. Memory and ability to understand (cognition). Work and work Statistician. Reproductive health. Screening  You may have the following tests or measurements: Height, weight, and BMI. Blood pressure. Lipid and cholesterol levels. These may be checked every 5 years, or more frequently if you are over 79 years old. Skin check. Lung cancer screening. You may have this screening every year starting at age 65 if you have a 30-pack-year history of smoking and currently smoke or have quit within the past 15 years. Fecal occult blood test (FOBT) of the stool. You may have this test every year starting at age 80. Flexible sigmoidoscopy or colonoscopy. You may have a sigmoidoscopy every 5 years or a colonoscopy every 10 years starting at age 61. Hepatitis C blood test. Hepatitis B blood test. Sexually transmitted disease (STD) testing. Diabetes screening. This is done by checking your blood sugar (glucose) after you have not eaten for a while (fasting). You may have this done every 1-3 years. Bone density scan. This is done to screen for osteoporosis. You may have this done starting at age 61. Mammogram. This may be done every 1-2 years. Talk to your health care provider about how often you should have regular mammograms.  Talk with your health care provider about your test results, treatment options, and if necessary, the need for more  tests. Vaccines  Your health care provider may recommend certain vaccines, such as: Influenza vaccine. This is recommended every year. Tetanus, diphtheria, and acellular pertussis (Tdap, Td) vaccine. You may need a Td booster every 10 years. Zoster vaccine. You may need this after age 15. Pneumococcal 13-valent conjugate (PCV13) vaccine. One dose is recommended after age 86. Pneumococcal polysaccharide (PPSV23) vaccine. One dose is recommended after age 30. Talk to your health care provider about which screenings and vaccines you need and how often you need them. This information is not intended to replace advice given to you by your health care provider. Make sure you discuss any questions you have with your health care provider. Document Released: 10/28/2015 Document Revised: 06/20/2016 Document Reviewed: 08/02/2015 Elsevier Interactive Patient Education  2017 [place] Prevention in the Home Falls can cause injuries. They can happen to people of all ages. There are many things you can do to make your home safe and to help prevent falls. What can I do on the outside of my home? Regularly fix the edges of walkways and driveways and fix any cracks. Remove anything that might make you trip as you walk through a door, such as a raised step or threshold. Trim any bushes or trees on the path to your home. Use bright outdoor lighting. Clear any walking paths of anything that might make someone trip, such as rocks or tools. Regularly check to see if handrails are loose or broken. Make sure that both sides of any steps have handrails. Any raised decks and porches should have guardrails on the edges. Have any leaves, snow, or ice cleared regularly. Use sand or salt on walking paths during winter. Clean up any spills in your garage right away. This includes oil or grease spills. What can I do in the bathroom? Use night lights. Install grab bars by the toilet and in the tub and shower.  Do not use towel bars as grab bars. Use non-skid mats or decals in the tub or shower. If you need to sit down in the shower, use a plastic, non-slip stool. Keep the floor dry. Clean up any water that spills on the floor as soon as it happens. Remove soap buildup in the tub or shower regularly. Attach bath mats securely with double-sided non-slip rug tape. Do not have throw rugs and other things on the floor that can make you trip. What can I do in the bedroom? Use night lights. Make sure that you have a light by your bed that is easy to reach. Do not use any sheets or blankets that are too big for your bed. They should not hang down onto the floor. Have a firm chair that has side arms. You can use this for support while you get dressed. Do not have throw rugs and other things on the floor that can make you trip. What can I do in the kitchen? Clean up any spills right away. Avoid walking on wet floors. Keep items that you use a lot in easy-to-reach places. If you need to reach something above you, use a strong step stool that has a grab bar. Keep electrical cords out of the way. Do not use floor polish or wax that makes floors slippery. If you must use wax, use non-skid floor wax. Do not have throw rugs and other things on the floor that can make  you trip. What can I do with my stairs? Do not leave any items on the stairs. Make sure that there are handrails on both sides of the stairs and use them. Fix handrails that are broken or loose. Make sure that handrails are as long as the stairways. Check any carpeting to make sure that it is firmly attached to the stairs. Fix any carpet that is loose or worn. Avoid having throw rugs at the top or bottom of the stairs. If you do have throw rugs, attach them to the floor with carpet tape. Make sure that you have a light switch at the top of the stairs and the bottom of the stairs. If you do not have them, ask someone to add them for you. What else  can I do to help prevent falls? Wear shoes that: Do not have high heels. Have rubber bottoms. Are comfortable and fit you well. Are closed at the toe. Do not wear sandals. If you use a stepladder: Make sure that it is fully opened. Do not climb a closed stepladder. Make sure that both sides of the stepladder are locked into place. Ask someone to hold it for you, if possible. Clearly mark and make sure that you can see: Any grab bars or handrails. First and last steps. Where the edge of each step is. Use tools that help you move around (mobility aids) if they are needed. These include: Canes. Walkers. Scooters. Crutches. Turn on the lights when you go into a dark area. Replace any light bulbs as soon as they burn out. Set up your furniture so you have a clear path. Avoid moving your furniture around. If any of your floors are uneven, fix them. If there are any pets around you, be aware of where they are. Review your medicines with your doctor. Some medicines can make you feel dizzy. This can increase your chance of falling. Ask your doctor what other things that you can do to help prevent falls. This information is not intended to replace advice given to you by your health care provider. Make sure you discuss any questions you have with your health care provider. Document Released: 07/28/2009 Document Revised: 03/08/2016 Document Reviewed: 11/05/2014 Elsevier Interactive Patient Education  2017 [employer].

## 2021-07-07 ENCOUNTER — Other Ambulatory Visit: Payer: Self-pay

## 2021-07-07 ENCOUNTER — Telehealth: Payer: Self-pay | Admitting: Family Medicine

## 2021-07-07 ENCOUNTER — Ambulatory Visit (INDEPENDENT_AMBULATORY_CARE_PROVIDER_SITE_OTHER): Payer: Medicare HMO | Admitting: Family Medicine

## 2021-07-07 DIAGNOSIS — U071 COVID-19: Secondary | ICD-10-CM

## 2021-07-07 MED ORDER — NIRMATRELVIR/RITONAVIR (PAXLOVID)TABLET: 3.0000 | ORAL_TABLET | Freq: Two times a day (BID) | ORAL | 0 refills | Status: AC

## 2021-07-07 NOTE — Telephone Encounter (Signed)
COVID infection can be unpredictable Some people can get very mild cases other people can start off mild then several days later have severe  For individuals who are 65 and over or have high risk health issues consideration for antivirals is indicated by protocol  so if she desires to use antiviral we will need to do the phone visit  If she does not want to do antivirals then supportive care at home with Tylenol and rest and medical evaluation if shortness of breath or progressively worse

## 2021-07-07 NOTE — Progress Notes (Signed)
   Subjective:    Patient ID: Nicole Lam, female    DOB: [birth year], 70 y.o.   MRN: [id number]  HPI  Patient presents today with respiratory illness Number of days present-today  Symptoms include- headache, stomach pain, fever, diarrhea  Presence of worrisome signs (severe shortness of breath, lethargy, etc.) - none  Recent/current visit to urgent care or ER- none  Recent direct exposure to Covid- none  Any current Covid testing- yes home test this morning  Virtual Visit via Telephone Note  I connected with Nicole Lam on 07/07/21 at 12:00 PM EDT by telephone and verified that I am speaking with the correct person using two identifiers.  Location: Patient: home Provider: office   I discussed the limitations, risks, security and privacy concerns of performing an evaluation and management service by telephone and the availability of in person appointments. I also discussed with the patient that there may be a patient responsible charge related to this service. The patient expressed understanding and agreed to proceed.   History of Present Illness:    Observations/Objective:   Assessment and Plan:   Follow Up Instructions:    I discussed the assessment and treatment plan with the patient. The patient was provided an opportunity to ask questions and all were answered. The patient agreed with the plan and demonstrated an understanding of the instructions.   The patient was advised to call back or seek an in-person evaluation if the symptoms worsen or if the condition fails to improve as anticipated.  I provided  15 minutes of non-face-to-face time during this encounter.        Review of Systems     Objective:   Physical Exam Today's visit was via telephone Physical exam was not possible for this visit        Assessment & Plan:   Covid infection This is a viral process.  Mild cases are treated with supportive measures at home such as Tylenol rest  fluids.  In some situations monoclonal antibodies may be appropriate depending on the patient's risk criteria.  The patient was educated regarding progressive illness including respiratory, persistent vomiting, change in mental status.  If any of these occur ER evaluation is recommended. Patient was educated about the following as well Covid-19 respiratory warning: Covid-19 is a virus that causes hypoxia (low oxygen level in blood) in some people. If you develop any changes in your usual breathing pattern: difficulty catching your breath, more short winded with activity or with resting, or anything that concerns you about your breathing, do not hesitate to go to the emergency department immediately for evaluation. Please do not delay to get treatment.   Agrees with plan of care discussed today. Understands warning signs to seek further care: Chest pain, shortness of breath, mental confusion, profuse vomiting, any significant change in health. Understands to follow-up if symptoms do not improve, or worsen.    Paxlovid rxd Side effects discussed was told to stop statin follow-up if any progressive troubles

## 2021-07-07 NOTE — Telephone Encounter (Signed)
Pt contacted and placed on schedule for telephone call with provider.

## 2021-07-07 NOTE — Telephone Encounter (Signed)
Pt husband Abe People contacted. Pt tested positive this morning. No trouble breathing, no shortness of breath. Having headache, stomach pain, fever, diarrhea. No nausea/vomiting. Please advise. Thank you  [employer].

## 2021-07-07 NOTE — Telephone Encounter (Signed)
Tested positive for Covid, has headache,fever, stomach pain and diarrhea. Please advise   [phone]

## 2021-07-07 NOTE — Telephone Encounter (Signed)
[name], denio are scheduled for a virtual visit with your provider today.    Just as we do with appointments in the office, we must obtain your consent to participate.  Your consent will be active for this visit and any virtual visit you may have with one of our providers in the next 365 days.    If you have a MyChart account, I can also send a copy of this consent to you electronically.  All virtual visits are billed to your insurance company just like a traditional visit in the office.  As this is a virtual visit, video technology does not allow for your provider to perform a traditional examination.  This may limit your provider's ability to fully assess your condition.  If your provider identifies any concerns that need to be evaluated in person or the need to arrange testing such as labs, EKG, etc, we will make arrangements to do so.    Although advances in technology are sophisticated, we cannot ensure that it will always work on either your end or our end.  If the connection with a video visit is poor, we may have to switch to a telephone visit.  With either a video or telephone visit, we are not always able to ensure that we have a secure connection.   I need to obtain your verbal consent now.   Are you willing to proceed with your visit today?   Nicole Lam has provided verbal consent on 07/07/2021 for a virtual visit (video or telephone).   Vicente Males, LPN 3/88/8280  03:49 PM

## 2021-07-28 ENCOUNTER — Telehealth: Payer: Self-pay | Admitting: Family Medicine

## 2021-07-28 NOTE — Telephone Encounter (Signed)
Both PT and spouse Nicole Lam [birth year] have appointments on 10/28. They are requesting paperwork for labs. They will getting labs done 10/21  CB#  [phone]

## 2021-07-28 NOTE — Telephone Encounter (Signed)
Active labs in Epic from 04/25/21 urine micro, A1C, bmet, hepatic and lipid. Please advise. Thank you  (Separate message on husband)

## 2021-07-30 NOTE — Telephone Encounter (Signed)
All the labs that was ordered in July is what I would want.  It should still be active.  Thank you

## 2021-07-31 NOTE — Telephone Encounter (Signed)
Pt spouse contacted and verbalized understanding

## 2021-08-04 ENCOUNTER — Other Ambulatory Visit (HOSPITAL_COMMUNITY): Payer: Self-pay | Admitting: Family Medicine

## 2021-08-04 DIAGNOSIS — Z1231 Encounter for screening mammogram for malignant neoplasm of breast: Secondary | ICD-10-CM

## 2021-08-04 DIAGNOSIS — E119 Type 2 diabetes mellitus without complications: Secondary | ICD-10-CM | POA: Diagnosis not present

## 2021-08-04 DIAGNOSIS — E785 Hyperlipidemia, unspecified: Secondary | ICD-10-CM | POA: Diagnosis not present

## 2021-08-04 DIAGNOSIS — K760 Fatty (change of) liver, not elsewhere classified: Secondary | ICD-10-CM | POA: Diagnosis not present

## 2021-08-04 DIAGNOSIS — E1169 Type 2 diabetes mellitus with other specified complication: Secondary | ICD-10-CM | POA: Diagnosis not present

## 2021-08-05 LAB — HEPATIC FUNCTION PANEL
ALT: 86 IU/L — ABNORMAL HIGH (ref 0–32)
AST: 88 IU/L — ABNORMAL HIGH (ref 0–40)
Albumin: 4.9 g/dL — ABNORMAL HIGH (ref 3.7–4.7)
Alkaline Phosphatase: 82 IU/L (ref 44–121)
Bilirubin Total: 0.8 mg/dL (ref 0.0–1.2)
Bilirubin, Direct: 0.17 mg/dL (ref 0.00–0.40)
Total Protein: 8.1 g/dL (ref 6.0–8.5)

## 2021-08-05 LAB — MICROALBUMIN / CREATININE URINE RATIO
Creatinine, Urine: 10.3 mg/dL
Microalb/Creat Ratio: <29 mg/g creat (ref 0–29)
Microalbumin, Urine: <3 ug/mL

## 2021-08-05 LAB — BASIC METABOLIC PANEL
BUN/Creatinine Ratio: 12 (ref 12–28)
BUN: 11 mg/dL (ref 8–27)
CO2: 25 mmol/L (ref 20–29)
Calcium: 10.2 mg/dL (ref 8.7–10.3)
Chloride: 99 mmol/L (ref 96–106)
Creatinine, Ser: 0.89 mg/dL (ref 0.57–1.00)
Glucose: 158 mg/dL — ABNORMAL HIGH (ref 70–99)
Potassium: 4.6 mmol/L (ref 3.5–5.2)
Sodium: 140 mmol/L (ref 134–144)
eGFR: 69 mL/min/{1.73_m2} (ref >59)

## 2021-08-05 LAB — LIPID PANEL
Chol/HDL Ratio: 5.1 ratio — ABNORMAL HIGH (ref 0.0–4.4)
Cholesterol, Total: 198 mg/dL (ref 100–199)
HDL: 39 mg/dL — ABNORMAL LOW (ref >39)
LDL Chol Calc (NIH): 125 mg/dL — ABNORMAL HIGH (ref 0–99)
Triglycerides: 189 mg/dL — ABNORMAL HIGH (ref 0–149)
VLDL Cholesterol Cal: 34 mg/dL (ref 5–40)

## 2021-08-05 LAB — HEMOGLOBIN A1C
Est. average glucose Bld gHb Est-mCnc: 177 mg/dL
Hgb A1c MFr Bld: 7.8 % — ABNORMAL HIGH (ref 4.8–5.6)

## 2021-08-11 ENCOUNTER — Ambulatory Visit (INDEPENDENT_AMBULATORY_CARE_PROVIDER_SITE_OTHER): Payer: Medicare HMO | Admitting: Family Medicine

## 2021-08-11 ENCOUNTER — Other Ambulatory Visit: Payer: Self-pay

## 2021-08-11 VITALS — BP 135/85 | HR 88 | Ht 59.0 in | Wt 133.6 lb

## 2021-08-11 DIAGNOSIS — Z23 Encounter for immunization: Secondary | ICD-10-CM | POA: Diagnosis not present

## 2021-08-11 DIAGNOSIS — R7989 Other specified abnormal findings of blood chemistry: Secondary | ICD-10-CM | POA: Diagnosis not present

## 2021-08-11 DIAGNOSIS — E119 Type 2 diabetes mellitus without complications: Secondary | ICD-10-CM | POA: Diagnosis not present

## 2021-08-11 DIAGNOSIS — K760 Fatty (change of) liver, not elsewhere classified: Secondary | ICD-10-CM

## 2021-08-11 DIAGNOSIS — K7581 Nonalcoholic steatohepatitis (NASH): Secondary | ICD-10-CM

## 2021-08-11 DIAGNOSIS — E785 Hyperlipidemia, unspecified: Secondary | ICD-10-CM | POA: Diagnosis not present

## 2021-08-11 DIAGNOSIS — E1169 Type 2 diabetes mellitus with other specified complication: Secondary | ICD-10-CM

## 2021-08-11 DIAGNOSIS — E039 Hypothyroidism, unspecified: Secondary | ICD-10-CM | POA: Diagnosis not present

## 2021-08-11 MED ORDER — GLIPIZIDE ER 5 MG PO TB24: ORAL_TABLET | ORAL | 1 refills | Status: DC

## 2021-08-11 MED ORDER — HYDROCODONE-ACETAMINOPHEN 7.5-325 MG PO TABS: ORAL_TABLET | ORAL | 0 refills | Status: DC

## 2021-08-11 NOTE — Progress Notes (Signed)
Subjective:    Patient ID: Nicole Lam, female    DOB: 30-Nov-1950, 70 y.o.   MRN: [id number]  Diabetes She presents for her follow-up diabetic visit. She has type 2 diabetes mellitus. Risk factors for coronary artery disease include diabetes mellitus, dyslipidemia, hypertension and post-menopausal. Current diabetic treatment includes oral agent (dual therapy). She is compliant with treatment all of the time.   The 10-year ASCVD risk score (Arnett DK, et al., 2019) is: 28.9%   Values used to calculate the score:     Age: 53 years     Sex: Female     Is Non-Hispanic African American: No     Diabetic: Yes     Tobacco smoker: No     Systolic Blood Pressure: 080 mmHg     Is BP treated: Yes     HDL Cholesterol: 39 mg/dL     Total Cholesterol: 198 mg/dL  Fatty liver - Plan: Hepatic function panel  [name] (nonalcoholic steatohepatitis)  Hypothyroidism, unspecified type  Hyperlipidemia associated with type 2 diabetes mellitus ([place]) - Plan: Lipid panel  Abnormal LFTs  Type 2 diabetes mellitus without complication, without long-term current use of insulin (HCC) - Plan: Hemoglobin A1c  Need for vaccination - Plan: Flu Vaccine QUAD High Dose(Fluad)  Results for orders placed or performed in visit on 04/25/21  Lipid panel  Result Value Ref Range   Cholesterol, Total 198 100 - 199 mg/dL   Triglycerides 189 (H) 0 - 149 mg/dL   HDL 39 (L) >39 mg/dL   VLDL Cholesterol Cal 34 5 - 40 mg/dL   LDL Chol Calc (NIH) 125 (H) 0 - 99 mg/dL   Chol/HDL Ratio 5.1 (H) 0.0 - 4.4 ratio  Hepatic function panel  Result Value Ref Range   Total Protein 8.1 6.0 - 8.5 g/dL   Albumin 4.9 (H) 3.7 - 4.7 g/dL   Bilirubin Total 0.8 0.0 - 1.2 mg/dL   Bilirubin, Direct 0.17 0.00 - 0.40 mg/dL   Alkaline Phosphatase 82 44 - 121 IU/L   AST 88 (H) 0 - 40 IU/L   ALT 86 (H) 0 - 32 IU/L  Basic metabolic panel  Result Value Ref Range   Glucose 158 (H) 70 - 99 mg/dL   BUN 11 8 - 27 mg/dL   Creatinine, Ser 0.89  0.57 - 1.00 mg/dL   eGFR 69 >59 mL/min/1.73   BUN/Creatinine Ratio 12 12 - 28   Sodium 140 134 - 144 mmol/L   Potassium 4.6 3.5 - 5.2 mmol/L   Chloride 99 96 - 106 mmol/L   CO2 25 20 - 29 mmol/L   Calcium 10.2 8.7 - 10.3 mg/dL  Hemoglobin A1c  Result Value Ref Range   Hgb A1c MFr Bld 7.8 (H) 4.8 - 5.6 %   Est. average glucose Bld gHb Est-mCnc 177 mg/dL  Microalbumin / creatinine urine ratio  Result Value Ref Range   Creatinine, Urine 10.3 Not Estab. mg/dL   Microalbumin, Urine <3.0 Not Estab. ug/mL   Microalb/Creat Ratio <29 0 - 29 mg/g creat     Very nice patient with challenging issues.  She is trying to do the best she can.  Has very little additional money to help her with all of these costs  Is reluctant to go on other medications currently Has underlying fatty liver but it could be compounded by the cholesterol medicine hard to tell    Review of Systems     Objective:   Physical Exam  General-in no  acute distress Eyes-no discharge Lungs-respiratory rate normal, CTA CV-no murmurs,RRR Extremities skin warm dry no edema Neuro grossly normal Behavior normal, alert       Assessment & Plan:  1. Fatty liver Elevated liver enzymes recommend repeat liver profile within 3 months stop Lipitor for now - Hepatic function panel  2. [name] (nonalcoholic steatohepatitis) See per above healthy diet and portion control stay active try to keep weight down and do the above  3. Hypothyroidism, unspecified type Thyroid under decent control continue levothyroxine currently  4. Hyperlipidemia associated with type 2 diabetes mellitus ([place]) Cholesterol not under great control plus also her liver enzymes went up we will stop the atorvastatin repeat lipid liver 3 months if liver enzymes have come down then statin is a cause of liver enzyme stay up then [name] is the cause if [name] the cause.  Potentially continue statin reinitiate Crestor or Lipitor if liver enzymes come down and it is  statins as the cause Repatha would be a good choice - Lipid panel  5. Abnormal LFTs See above discussion  6. Type 2 diabetes mellitus without complication, without long-term current use of insulin (HCC) We did discuss various ways to get this under control including GLP-1 SGLT 2 unfortunately patient does not want to start either 1 of these currently bumped up glipizide to twice daily continue everything else as it has repeat A1c in 3 months - Hemoglobin A1c  7. Need for vaccination Go ahead today - Flu Vaccine QUAD High Dose(Fluad)

## 2021-08-14 ENCOUNTER — Other Ambulatory Visit: Payer: Self-pay

## 2021-08-14 ENCOUNTER — Ambulatory Visit (HOSPITAL_COMMUNITY)
Admission: RE | Admit: 2021-08-14 | Discharge: 2021-08-14 | Disposition: A | Discharge status: Home / Self Care | Payer: Medicare HMO | Source: Ambulatory Visit | Attending: Family Medicine | Admitting: Family Medicine

## 2021-08-14 DIAGNOSIS — Z1231 Encounter for screening mammogram for malignant neoplasm of breast: Secondary | ICD-10-CM | POA: Diagnosis not present

## 2021-10-11 ENCOUNTER — Encounter: Payer: Self-pay | Admitting: Family Medicine

## 2021-10-11 ENCOUNTER — Other Ambulatory Visit: Payer: Self-pay

## 2021-10-11 ENCOUNTER — Ambulatory Visit (INDEPENDENT_AMBULATORY_CARE_PROVIDER_SITE_OTHER): Payer: Medicare HMO | Admitting: Family Medicine

## 2021-10-11 VITALS — HR 104 | Temp 98.0°F | Ht 59.0 in | Wt 133.0 lb

## 2021-10-11 DIAGNOSIS — J019 Acute sinusitis, unspecified: Secondary | ICD-10-CM | POA: Diagnosis not present

## 2021-10-11 DIAGNOSIS — R058 Other specified cough: Secondary | ICD-10-CM

## 2021-10-11 DIAGNOSIS — R0789 Other chest pain: Secondary | ICD-10-CM | POA: Diagnosis not present

## 2021-10-11 MED ORDER — AMOXICILLIN 500 MG PO TABS: 500.0000 mg | ORAL_TABLET | Freq: Three times a day (TID) | ORAL | 0 refills | Status: DC

## 2021-10-11 NOTE — Progress Notes (Signed)
° °  Subjective:    Patient ID: Nicole Lam, female    DOB: 18-Oct-1950, 70 y.o.   MRN: [id number]  HPI  Dry cough - non productive with chest tightness and throat dryness x 2 days  Patient with chest tightness throat dryness over the past couple days fatigue tiredness not feeling good.  Review of Systems     Objective:   Physical Exam Gen-NAD not toxic TMS-normal bilateral T- normal no redness Chest-CTA respiratory rate normal no crackles CV RRR no murmur Skin-warm dry Neuro-grossly normal        Assessment & Plan:  Because of her chest tightness she was having with her coughing she said there is causing her trustful [place] she does have some tenderness on the chest wall.  I elected to do a EKG to make sure we do not see any signs of any underlying coronary artery disease/MI I do not see that with this.  Her characterization of the chest pain goes along more with musculoskeletal chest pain from the respiratory illness rather than MI.  She may well have some secondary rhinosinusitis we will go ahead with antibiotics COVID triple testing done today She was instructed to lay low over the next few days

## 2021-10-12 LAB — COVID-19, FLU A+B AND RSV
Influenza A, NAA: NOT DETECTED
Influenza B, NAA: NOT DETECTED
RSV, NAA: NOT DETECTED
SARS-CoV-2, NAA: NOT DETECTED

## 2021-10-19 ENCOUNTER — Telehealth: Payer: Self-pay | Admitting: Family Medicine

## 2021-10-19 ENCOUNTER — Other Ambulatory Visit: Payer: Self-pay | Admitting: Family Medicine

## 2021-10-19 MED ORDER — DOXYCYCLINE HYCLATE 100 MG PO TABS: 100.0000 mg | ORAL_TABLET | Freq: Two times a day (BID) | ORAL | 0 refills | Status: DC

## 2021-10-19 NOTE — Telephone Encounter (Signed)
Pt called and stated she was here last week and was prescribed Amoxicillin for URI. She stated she is not any better and wanted to know if something stronger could be called into the [facility] in [place]. She also stated she needed something easy to swallow. Call husband at home. [phone]

## 2021-10-19 NOTE — Progress Notes (Signed)
Doxycycline sent to the pharmacy.  Thersa Salt DO

## 2021-10-20 NOTE — Telephone Encounter (Signed)
Patient notified

## 2021-10-20 NOTE — Telephone Encounter (Signed)
Coral Spikes, DO    Sent in new antibiotic to requested pharmacy.

## 2021-10-20 NOTE — Telephone Encounter (Signed)
Left message to return call 

## 2021-11-09 ENCOUNTER — Other Ambulatory Visit: Payer: Self-pay

## 2021-11-09 ENCOUNTER — Ambulatory Visit (INDEPENDENT_AMBULATORY_CARE_PROVIDER_SITE_OTHER): Payer: Medicare HMO | Admitting: Nurse Practitioner

## 2021-11-09 ENCOUNTER — Encounter: Payer: Self-pay | Admitting: Nurse Practitioner

## 2021-11-09 VITALS — BP 152/74 | HR 67 | Temp 97.7°F | Ht 59.0 in | Wt 133.0 lb

## 2021-11-09 DIAGNOSIS — K760 Fatty (change of) liver, not elsewhere classified: Secondary | ICD-10-CM | POA: Diagnosis not present

## 2021-11-09 DIAGNOSIS — E785 Hyperlipidemia, unspecified: Secondary | ICD-10-CM | POA: Diagnosis not present

## 2021-11-09 DIAGNOSIS — M79672 Pain in left foot: Secondary | ICD-10-CM | POA: Diagnosis not present

## 2021-11-09 DIAGNOSIS — L989 Disorder of the skin and subcutaneous tissue, unspecified: Secondary | ICD-10-CM | POA: Diagnosis not present

## 2021-11-09 DIAGNOSIS — R21 Rash and other nonspecific skin eruption: Secondary | ICD-10-CM

## 2021-11-09 DIAGNOSIS — E1169 Type 2 diabetes mellitus with other specified complication: Secondary | ICD-10-CM | POA: Diagnosis not present

## 2021-11-09 DIAGNOSIS — E119 Type 2 diabetes mellitus without complications: Secondary | ICD-10-CM | POA: Diagnosis not present

## 2021-11-09 MED ORDER — TRIAMCINOLONE ACETONIDE 0.1 % EX CREA: 1.0000 "application " | TOPICAL_CREAM | Freq: Two times a day (BID) | CUTANEOUS | 0 refills | Status: DC

## 2021-11-09 NOTE — Progress Notes (Signed)
° °  Subjective:    Patient ID: Nicole Lam, female    DOB: 09-25-1951, 71 y.o.   MRN: [id number]  HPI  Patient here with complaints of left foot pain for several months.  Patient states that the pain is intermittent and it has a burning sensation when it does hurt.  Pain is worse in the evening after she is done at work or wearing shoes. Patient states she also notices sometimes in the evening while watching TV.  Patient denies radiating pain, swelling, redness, and tingling.  Patient also concerned about a rash on her right arm that has been there for a month and also a spot to the proximal side of her right arm that has been there for several months.  Patient states that the rash sometimes itches but is better with lotion.  Patient states that the spots on her arm is painful when hit on the arm of a chair but otherwise not problematic.  Patient denies that the area has grown or is red or warm to touch.  Review of Systems  All other systems reviewed and are negative.     Objective:   Physical Exam Constitutional:      General: She is not in acute distress.    Appearance: Normal appearance. She is normal weight. She is not ill-appearing or toxic-appearing.  HENT:     Head: Normocephalic.  Cardiovascular:     Rate and Rhythm: Normal rate and regular rhythm.     Pulses: Normal pulses.     Heart sounds: No murmur heard. Pulmonary:     Effort: Pulmonary effort is normal. No respiratory distress.     Breath sounds: No wheezing.  Musculoskeletal:        General: Normal range of motion.     Right foot: Normal.     Left foot: Normal range of motion and normal capillary refill. Deformity present. No swelling, bunion, Charcot foot, foot drop, prominent metatarsal heads, laceration, tenderness, bony tenderness or crepitus. Normal pulse.     Comments: Patient has bony prominence to her left lateral foot near her fifth metatarsal.  Patient denies tenderness to palpitation to.  Skin:    General:  Skin is warm.     Comments: Rash noted to lateral right arm.  Area is slightly raised, thickened skin, pink to red in color.  Brown to tan skin lesion noted to back of forearm is macular in nature.  Nonraised.  Slightly scaly nontender to touch at this moment.  Well-circumscribed and uniform in color.  Neurological:     Mental Status: She is alert.          Assessment & Plan:   1. Left foot pain -Possible tailor's bunion versus fifth metatarsal arthritis - Patient to use bunion patch for symptom relief. - Patient may use larger shoes to prevent friction against the area for symptom relief. - Patient to return to clinic if area becomes increasingly worse. - Podiatry referral offered however patient states that she will return for podiatry referral if pain gets worse  2. Rash -Likely atopic dermatitis - Continue using moisturizing lotions as needed. - May use triamcinolone as needed for itching. -Caution with steroid ointment due to history of diabetes. -Return to clinic if area does not improve or becomes worse  3. Arm skin lesion, right -Unsure of etiology.  Possible dermafibrotic lesion -Referral to dermatology for evaluation. - Return to clinic if area becomes larger or new symptoms arise.

## 2021-11-10 LAB — HEMOGLOBIN A1C
Est. average glucose Bld gHb Est-mCnc: 169 mg/dL
Hgb A1c MFr Bld: 7.5 % — ABNORMAL HIGH (ref 4.8–5.6)

## 2021-11-10 LAB — LIPID PANEL
Chol/HDL Ratio: 8.9 ratio — ABNORMAL HIGH (ref 0.0–4.4)
Cholesterol, Total: 275 mg/dL — ABNORMAL HIGH (ref 100–199)
HDL: 31 mg/dL — ABNORMAL LOW (ref >39)
LDL Chol Calc (NIH): 181 mg/dL — ABNORMAL HIGH (ref 0–99)
Triglycerides: 319 mg/dL — ABNORMAL HIGH (ref 0–149)
VLDL Cholesterol Cal: 63 mg/dL — ABNORMAL HIGH (ref 5–40)

## 2021-11-10 LAB — HEPATIC FUNCTION PANEL
ALT: 93 IU/L — ABNORMAL HIGH (ref 0–32)
AST: 110 IU/L — ABNORMAL HIGH (ref 0–40)
Albumin: 4.4 g/dL (ref 3.7–4.7)
Alkaline Phosphatase: 72 IU/L (ref 44–121)
Bilirubin Total: 0.6 mg/dL (ref 0.0–1.2)
Bilirubin, Direct: 0.15 mg/dL (ref 0.00–0.40)
Total Protein: 7.1 g/dL (ref 6.0–8.5)

## 2021-11-11 ENCOUNTER — Encounter: Payer: Self-pay | Admitting: Family Medicine

## 2021-11-24 ENCOUNTER — Ambulatory Visit (INDEPENDENT_AMBULATORY_CARE_PROVIDER_SITE_OTHER): Payer: Medicare HMO | Admitting: Family Medicine

## 2021-11-24 ENCOUNTER — Other Ambulatory Visit: Payer: Self-pay

## 2021-11-24 DIAGNOSIS — M19049 Primary osteoarthritis, unspecified hand: Secondary | ICD-10-CM | POA: Insufficient documentation

## 2021-11-24 DIAGNOSIS — M19041 Primary osteoarthritis, right hand: Secondary | ICD-10-CM

## 2021-11-24 MED ORDER — DICLOFENAC SODIUM 75 MG PO TBEC: 75.0000 mg | DELAYED_RELEASE_TABLET | Freq: Two times a day (BID) | ORAL | 0 refills | Status: AC

## 2021-11-24 NOTE — Patient Instructions (Signed)
Ice and heat.  Use medication as directed.  Do not use ibuprofen, Aleve, Goody powder, etc. with this medication.  Follow-up with Dr. Wolfgang Phoenix if this persists/does not improve.

## 2021-11-24 NOTE — Progress Notes (Signed)
Subjective:  Patient ID: Nicole Lam, female    DOB: 1950-12-04  Age: 71 y.o. MRN: [id number]  CC: Chief Complaint  Patient presents with   right arm and hand pain    Has been going on for 1 1/2 weeks. No swelling noted.    HPI:  71 year old female presents with the above complaint.  Patient reports pain of the right hand which radiates upward.  She states that this has been going on for the past 1.5 weeks.  She has been taking Motrin without relief.  She has also been taking her home Vicodin without resolution.  Denies any fall, trauma, injury.  Worse with range of motion/movement.  Patient Active Problem List   Diagnosis Date Noted   Osteoarthritis, hand 11/24/2021   [name] (nonalcoholic steatohepatitis) 06/21/2021   Hyperlipidemia associated with type 2 diabetes mellitus ([place]) 08/11/2020   Iron deficiency anemia 01/12/2020   External hemorrhoid 07/15/2017   Right hip pain 01/20/2016   Abnormal LFTs 11/24/2015   Constipation 11/24/2015   DM type 2 (diabetes mellitus, type 2) ([place]) 09/16/2015   Herpes simplex type II infection 06/05/2015   GERD (gastroesophageal reflux disease) 03/30/2014   Chronic back pain 03/30/2014   Peripheral neuropathy 10/20/2013   Hypothyroidism 03/23/2013   Arthralgia 03/23/2013    Social Hx   Social History   Socioeconomic History   Marital status: Married    Spouse name: Holiday representative   Number of children: 4   Years of education: Not on file   Highest education level: Not on file  Occupational History   Not on file  Tobacco Use   Smoking status: Never   Smokeless tobacco: Never  Substance and Sexual Activity   Alcohol use: No    Alcohol/week: 0.0 standard drinks   Drug use: No   Sexual activity: Yes    Birth control/protection: Surgical    Comment: hyst  Other Topics Concern   Not on file  Social History Narrative   Total of 4 kids between pt and husband. 3 living.    Married x 28 years.    Social Determinants of Health    Financial Resource Strain: Low Risk    Difficulty of Paying Living Expenses: Not hard at all  Food Insecurity: No Food Insecurity   Worried About Charity fundraiser in the Last Year: Never true   [place] in the Last Year: Never true  Transportation Needs: No Transportation Needs   Lack of Transportation (Medical): No   Lack of Transportation (Non-Medical): No  Physical Activity: Inactive   Days of Exercise per Week: 0 days   Minutes of Exercise per Session: 0 min  Stress: No Stress Concern Present   Feeling of Stress : Not at all  Social Connections: Moderately Integrated   Frequency of Communication with Friends and Family: Twice a week   Frequency of Social Gatherings with Friends and Family: Never   Attends Religious Services: More than 4 times per year   Active Member of [employer] or Organizations: Yes   Attends Music therapist: More than 4 times per year   Marital Status: Married    Review of Systems Per HPI  Objective:  BP (!) 156/73    Pulse 75    Temp 98.4 F (36.9 C) (Oral)    Ht 4' 11"  (1.499 m)    Wt 133 lb 6.4 oz (60.5 kg)    SpO2 98%    BMI 26.94 kg/m   BP/Weight 11/24/2021  11/09/2021 82/42/3536  Systolic BP 144 315 -  Diastolic BP 73 74 -  Wt. (Lbs) 133.4 133 133  BMI 26.94 26.86 26.86    Physical Exam Vitals and nursing note reviewed.  Constitutional:      General: She is not in acute distress.    Appearance: She is not ill-appearing.  HENT:     Head: Normocephalic and atraumatic.  Pulmonary:     Effort: Pulmonary effort is normal. No respiratory distress.  Musculoskeletal:     Comments: Good range of motion of the right shoulder.  Good range of motion and no tenderness to palpation of the right elbow.  Right wrist has good passive range of motion.  There are no discrete areas of tenderness.  Heberden's nodes noted on the right hand.  Patient has evidence of osteoarthritis.  Decreased range of motion of the digits.   Neurological:     Mental Status: She is alert.  Psychiatric:        Mood and Affect: Mood normal.        Behavior: Behavior normal.    Lab Results  Component Value Date   WBC 6.3 06/12/2021   HGB 12.9 06/12/2021   HCT 39.5 06/12/2021   PLT 267 06/12/2021   GLUCOSE 158 (H) 08/04/2021   CHOL 275 (H) 11/09/2021   TRIG 319 (H) 11/09/2021   HDL 31 (L) 11/09/2021   LDLCALC 181 (H) 11/09/2021   ALT 93 (H) 11/09/2021   AST 110 (H) 11/09/2021   NA 140 08/04/2021   K 4.6 08/04/2021   CL 99 08/04/2021   CREATININE 0.89 08/04/2021   BUN 11 08/04/2021   CO2 25 08/04/2021   TSH 1.500 02/03/2021   INR 1.1 12/19/2020   HGBA1C 7.5 (H) 11/09/2021   MICROALBUR 0.2 10/16/2014     Assessment & Plan:   Problem List Items Addressed This Visit       Musculoskeletal and Integument   Osteoarthritis, hand    Clinically, patient appears to have osteoarthritis. Brief use of diclofenac.  Patient can continue her home pain medication if the pain is significant enough to warrant use. Advised close follow-up with her PCP. Also advised ice and heat.      Relevant Medications   diclofenac (VOLTAREN) 75 MG EC tablet    Meds ordered this encounter  Medications   diclofenac (VOLTAREN) 75 MG EC tablet    Sig: Take 1 tablet (75 mg total) by mouth 2 (two) times daily for 7 days.    Dispense:  14 tablet    Refill:  [place]

## 2021-11-24 NOTE — Assessment & Plan Note (Signed)
Clinically, patient appears to have osteoarthritis. Brief use of diclofenac.  Patient can continue her home pain medication if the pain is significant enough to warrant use. Advised close follow-up with her PCP. Also advised ice and heat.

## 2021-11-29 ENCOUNTER — Telehealth: Payer: Self-pay

## 2021-11-29 MED ORDER — EZETIMIBE 10 MG PO TABS: 10.0000 mg | ORAL_TABLET | Freq: Every day | ORAL | 1 refills | Status: DC

## 2021-11-29 NOTE — Telephone Encounter (Signed)
Medication-Zetia-10 mg 1 daily can help, it is not a perfect medicine in regards to it might only lower the cholesterol some but it is generally well-tolerated may have #30 with 6 refills

## 2021-11-29 NOTE — Telephone Encounter (Signed)
Patient made aware of her new prescription zetia being sent to the pharmacy per drs orders.

## 2021-11-29 NOTE — Telephone Encounter (Signed)
Left message to return call 

## 2021-12-11 ENCOUNTER — Other Ambulatory Visit: Payer: Self-pay | Admitting: Family Medicine

## 2021-12-20 NOTE — Progress Notes (Signed)
Referring Provider: Kathyrn Drown, MD Primary Care Physician:  Kathyrn Drown, MD Primary GI Physician: Dr. Gala Romney  Chief Complaint  Patient presents with   Follow-up    HPI:   Nicole Lam is a 71 y.o. female presenting today for follow-up with history of IDA, dysphagia with Schatzki's ring s/p dilation June 2021, constipation, suspected [name] with mildly elevated LFTs and Medicare F2/F3 in 2017, undergoing yearly [place].   Ultrasound abdomen with elastography March 2022 with increased liver parenchymal echogenicity without mention of nodularity.  No focal liver lesion.  Spleen size normal.  Median kPa 3.8 with high probability of being normal. Extensive serologies in the past including hepatitis B, hepatitis C, smooth muscle antibody, mitochondrial antibody, ANA, immunoglobulins, ceruloplasmin, TTG IgA all negative. LFTs normalized October 2021.   EGD and colonoscopy June 2021 for IDA with 1 tubular adenoma, small Schatzki's ring s/p dilation, small hiatal hernia, otherwise normal exam. Patient unable to swallow Givens capsule.  Offered endoscopic placement patency capsule followed by endoscopic placement of Givens capsule if appropriate, but patient declined.  Also previously offered CT enterography, but patient also declined due to contrast causing nausea.   Last seen in our office 06/21/2021.  She denied overt GI bleeding.  No signs or symptoms of decompensated liver disease.  No significant upper GI symptoms.  MiraLAX was little too strong for her constipation.  No lower GI alarm symptoms.  Plan to repeat CBC and iron panel in 6 months, continue Protonix daily, HFP and yearly ultrasound in March 2023, stop MiraLAX and start Colace 100-200 mg daily as needed.  Follow-up in 6 months.  Labs I requested were not completed.  Labs with PCP October 2022 with AST 88 (H), ALT 86 (H), alk phos and total bilirubin within normal limits.  Triglycerides elevated at 189, LDL elevated at 125.  Hemoglobin  A1c 7.8.  Atorvastatin was stopped.  Repeat labs in January with AST 110, ALT 93, alk phos and total bilirubin normal.  Hemoglobin A1c 7.5.  Total cholesterol elevated at 275, triglycerides 319, LDL 91.  Repatha mentioned as an option for cholesterol management.  Today:  [name]: Less active now that she isn't working. Quit working on 2/9. Was eating more while she was working. Snacks while at work, but still healthy- yogurt and fruit bar. Lunch and dinner. Now eating breakfast and dinner only because she sleeps later. Rare fast food. Occasionally 2 ruth bar minis, but not daily. No other sweets. Rare chips or other snack foods. 1 soda at dinner. No tylenol, alcohol, or illicit drug use. When she was working, she was taking energin C with tumeric and ginger. Now every now and then. No herbal teas.   Started Zetia in [place] February after labs. No other new medications.   Denies abdominal distention, lower extremity edema, mental status changes, jaundice, bruising/bleeding.   IDA: Hemoglobin and iron panel last checked in August 2022 with hemoglobin 12.9, iron panel within normal limits, ferritin low normal at 27.    No bebpr, melena, or any other obvious blood loss. Ibuprofen a couple days a week for the last couple of weeks as she has been working more outside.  Usually, takes ibuprofen very rarely.  Taking Protonix 40 mg daily. No GERD symptoms, nausea, or vomiting. Started Protonix when she was having dysphagia previously.  None at this time. Schatzki's ring dilated in 2021.  Constipation: Taking MiraLAX daily. Bowels are moving well. No abdominal pain.   Past Medical History:  Diagnosis Date   Diabetes ([place])    Fatty liver    with mild LFT elevation, F2/F3 2017   Hemorrhoid    Hyperlipidemia    Hypertension    Hypothyroidism    Lipoma of neck     Past Surgical History:  Procedure Laterality Date   ABDOMINAL HYSTERECTOMY     AGILE CAPSULE N/A 06/14/2020   Procedure: AGILE  CAPSULE;  Surgeon: Daneil Dolin, MD; unable to swallow agile   APPENDECTOMY     COLONOSCOPY  05/30/05   YKZ:[id number] anal papilla, otherwise normal rectum.and colon   COLONOSCOPY N/A 11/04/2015   RMR: normal colonoscopy   COLONOSCOPY WITH PROPOFOL N/A 04/07/2020   Procedure: COLONOSCOPY WITH PROPOFOL;  Surgeon: Daneil Dolin, MD; One 4 mm polyp at ileocecal valve, otherwise normal exam.  Patient was heme-negative.  Pathology with tubular adenoma.  No recommendations to repeat colonoscopy.   DILATION AND CURETTAGE OF UTERUS     ESOPHAGEAL DILATION  04/07/2020   Procedure: ESOPHAGEAL DILATION;  Surgeon: Daneil Dolin, MD;  [facility]: [facility];  Service: Endoscopy;;   ESOPHAGOGASTRODUODENOSCOPY (EGD) WITH PROPOFOL N/A 04/07/2020   Procedure: ESOPHAGOGASTRODUODENOSCOPY (EGD) WITH PROPOFOL;  Surgeon: Daneil Dolin, MD;  Small Schatzki's ring s/p dilated, small hiatal hernia, otherwise normal exam.   POLYPECTOMY  04/07/2020   Procedure: POLYPECTOMY;  Surgeon: Daneil Dolin, MD;  Location: [facility];  Service: Endoscopy;;    Current Outpatient Medications  Medication Sig Dispense Refill   Blood Glucose Monitoring Suppl (TRUE METRIX METER) w/Device KIT USE AS DIRECTED 1 kit 0   diclofenac sodium (VOLTAREN) 1 % GEL Apply 4 g topically 4 (four) times daily. (Patient taking differently: Apply 4 g topically 2 (two) times daily as needed (pain).) 5 Tube 5   estradiol (ESTRACE) 0.5 MG tablet TAKE 1 TABLET EVERY DAY 90 tablet 11   ezetimibe (ZETIA) 10 MG tablet Take 1 tablet (10 mg total) by mouth daily. 30 tablet 1   glipiZIDE (GLUCOTROL XL) 5 MG 24 hr tablet Take one tablet po twice day 180 tablet 1   HYDROcodone-acetaminophen (NORCO) 7.5-325 MG tablet 1 q4 hours prn 30 tablet 0   ibuprofen (ADVIL) 400 MG tablet Take 400 mg by mouth every 6 (six) hours as needed. As needed for pain     levothyroxine (SYNTHROID) 88 MCG tablet TAKE 1/2 TABLET ON MONDAY AND FRIDAYS AND TAKE 1 WHOLE TABLET ALL  OTHER DAYS 78 tablet 1   loratadine (CLARITIN) 10 MG tablet Take 10 mg by mouth daily.     losartan (COZAAR) 50 MG tablet Take 1 tablet (50 mg total) by mouth daily. 90 tablet 1   metFORMIN (GLUCOPHAGE) 500 MG tablet TAKE 2 TABLETS IN THE MORNING AND 2 TABLETS IN THE EVENING 360 tablet 1   Multiple Vitamins-Minerals (MULTIVITAMIN WITH MINERALS) tablet Take 1 tablet by mouth daily.     pantoprazole (PROTONIX) 40 MG tablet TAKE 1 TABLET EVERY DAY 90 tablet 1   phenylephrine-shark liver oil-mineral oil-petrolatum (PREPARATION H) 0.25-3-14-71.9 % rectal ointment Place 1 application rectally as needed for hemorrhoids.     polyethylene glycol (MIRALAX / GLYCOLAX) packet Take 17 g by mouth daily.      triamcinolone cream (KENALOG) 0.1 % Apply 1 application topically 2 (two) times daily. 30 g 0   TRUE METRIX BLOOD GLUCOSE TEST test strip TEST BLOOD SUGAR EVERY DAY 100 strip 0   TRUEplus Lancets 33G MISC TEST ONE TO TWO TIMES DAILY AS NEEDED DUE TO FLUCTUATING GLUCOSE  200 each 0   No current facility-administered medications for this visit.    Allergies as of 12/21/2021 - Review Complete 12/21/2021  Allergen Reaction Noted   Atorvastatin  11/11/2021    Family History  Problem Relation Age of Onset   Heart disease Mother    Hypertension Mother    Birth defects Daughter    Colon cancer Neg Hx    Liver disease Neg Hx    Colon polyps Neg Hx     Social History   Socioeconomic History   Marital status: Married    Spouse name: Holiday representative   Number of children: 4   Years of education: Not on file   Highest education level: Not on file  Occupational History   Not on file  Tobacco Use   Smoking status: Never   Smokeless tobacco: Never  Substance and Sexual Activity   Alcohol use: No    Alcohol/week: 0.0 standard drinks   Drug use: No   Sexual activity: Yes    Birth control/protection: Surgical    Comment: hyst  Other Topics Concern   Not on file  Social History Narrative   Total of 4 kids  between pt and husband. 3 living.    Married x 28 years.    Social Determinants of Health   Financial Resource Strain: Low Risk    Difficulty of Paying Living Expenses: Not hard at all  Food Insecurity: No Food Insecurity   Worried About Charity fundraiser in the Last Year: Never true   [place] in the Last Year: Never true  Transportation Needs: No Transportation Needs   Lack of Transportation (Medical): No   Lack of Transportation (Non-Medical): No  Physical Activity: Inactive   Days of Exercise per Week: 0 days   Minutes of Exercise per Session: 0 min  Stress: No Stress Concern Present   Feeling of Stress : Not at all  Social Connections: Moderately Integrated   Frequency of Communication with Friends and Family: Twice a week   Frequency of Social Gatherings with Friends and Family: Never   Attends Religious Services: More than 4 times per year   Active Member of [employer] or Organizations: Yes   Attends Music therapist: More than 4 times per year   Marital Status: Married    Review of Systems: Gen: Denies fever, chills, cold or flulike symptoms, presyncope, syncope. CV: Denies chest pain, palpitations. Resp: Denies dyspnea or cough. GI: See HPI Heme: See HPI  Physical Exam: BP 126/72    Pulse 74    Temp (!) 97.5 F (36.4 C) (Temporal)    Ht _0  (1.499 m)    Wt 135 lb (61.2 kg)    BMI 27.27 kg/m  General:   Alert and oriented. No distress noted. Pleasant and cooperative.  Head:  Normocephalic and atraumatic. Eyes:  Conjuctiva clear without scleral icterus. Heart:  S1, S2 present without murmurs appreciated. Lungs:  Clear to auscultation bilaterally. No wheezes, rales, or rhonchi. No distress.  Abdomen:  +BS, protuberant, soft, non-tender. No rebound or guarding. No HSM or masses noted. Msk:  Symmetrical without gross deformities. Normal posture. Extremities:  Without edema. Neurologic:  Alert and  oriented x4 Psych:  Normal mood and affect.     Assessment:  71 year old female with history of IDA, dysphagia with Schatzki's ring, constipation, suspected [name] with elevated LFTs, presenting today for routine follow-up with no acute complaints.  [name]:  Clinically doing well without signs or symptoms of  decompensated liver disease.  Undergoing yearly [place]. Previously with F2/F3 fibrosis in 2017, but last ultrasound with elastography in March 2022 with fatty liver without mention of nodularity, normal spleen, and median K PA 3.8. Extensive serologies previously including hepatitis A, B, C all negative,smooth muscle antibody, mitochondrial antibody, ANA, immunoglobulins, ceruloplasmin, TTG IgA all negative.  LFTs previously normalized in October 2021, but more recently has increased again in October 2022 with AST 88, ALT 86.  Atorvastatin which was on the medication at that time was discontinued.  LFTs increase further in January with AST 110, ALT 93.  She denies starting any new medications prior to this, Tylenol, alcohol, drug use, herbal teas.  She was taking energin C daily, but now rarely since February.  Notably, cholesterol had also increased in October and January, now started on Zetia.  Hemoglobin A1c has been slowly improving, down to 7.5 in January.  Weight is stable.   Unclear why LFTs are increasing. Possibly progression of [name]. We will update HFP, RUQ [place] with elastography. Counseled on importance of healthy diet, weight loss, and exercise.   IDA:  S/p EGD and colonoscopy June 2021 with 1 tubular adenoma, small Schatzki's ring s/p dilation, small hiatal hernia, otherwise normal exams. Patient unable to swallow Givens capsule.  Offered endoscopic placement patency capsule followed by endoscopic placement of Givens capsule if appropriate, but patient declined.  Also previously offered CT enterography, but patient also declined due to contrast causing nausea.  She was treated with oral iron. Hemoglobin returned to normal at 13.4 in March  2022 with iron panel within normal limits.  Iron was discontinued at that time.  Most recent labs 06/12/2021 with hemoglobin fairly stable at 12.9, iron panel remains within normal limits.  She denies overt GI bleeding or any other obvious blood loss.  Remains on pantoprazole 40 mg daily. Admits to recent ibuprofen use as she has been working outside, but usually takes ibuprofen very rarely.  Discussed the importance of avoiding NSAIDs. We will plan to continue to monitor for now.  If declining hemoglobin/iron panel, would need to reconsider further evaluation.   Constipation:  Likely influenced by chronic pain medications.  Well-controlled on MiraLAX daily.  No alarm symptoms.  Colonoscopy up-to-date in June 2021.   Plan:  CBC, iron panel with ferritin, HFP RUQ ultrasound with elastography Counseled on fatty liver.  Separate written instructions provided. Avoid alcohol, Tylenol, over-the-counter supplements, herbal teas. Continue MiraLAX daily. Follow-up in 6 months or sooner if needed.   Aliene Altes, PA-C [facility] 12/21/2021

## 2021-12-21 ENCOUNTER — Telehealth: Payer: Self-pay | Admitting: *Deleted

## 2021-12-21 ENCOUNTER — Encounter: Payer: Self-pay | Admitting: Gastroenterology

## 2021-12-21 ENCOUNTER — Other Ambulatory Visit: Payer: Self-pay

## 2021-12-21 ENCOUNTER — Ambulatory Visit: Payer: Medicare HMO | Admitting: Gastroenterology

## 2021-12-21 VITALS — BP 126/72 | HR 74 | Temp 97.5°F | Ht 59.0 in | Wt 135.0 lb

## 2021-12-21 DIAGNOSIS — D509 Iron deficiency anemia, unspecified: Secondary | ICD-10-CM | POA: Diagnosis not present

## 2021-12-21 DIAGNOSIS — K7581 Nonalcoholic steatohepatitis (NASH): Secondary | ICD-10-CM

## 2021-12-21 DIAGNOSIS — K5900 Constipation, unspecified: Secondary | ICD-10-CM

## 2021-12-21 NOTE — Patient Instructions (Signed)
Please have blood work completed at [facility]. ? ?We will arrange you to have an ultrasound of your abdomen and anything hospital.  ? ?Instructions for fatty liver: ?Recommend 1-2# weight loss per week until ideal body weight through exercise & diet. ?Low fat/cholesterol diet.   ?Avoid sweets, sodas, fruit juices, sweetened beverages like tea, etc. ?Gradually increase exercise from 15 min daily up to 1 hr per day 5 days/week. ?Avoid alcohol use.  ?Avoid tylenol, over-the-counter supplements, herbal teas. ? ?Continue MiraLAX daily. ? ?We will follow-up with you in 6 months.  Do not hesitate to call if you have questions or concerns prior to your next visit. ? ?It was good to see you again today! ? ?Aliene Altes, PA-C ?[facility] ? ? ?

## 2021-12-21 NOTE — Telephone Encounter (Signed)
LM for pt to call back to give [place] appt details for 3/14, arrival 10:15am, npo midnight ?

## 2021-12-21 NOTE — Telephone Encounter (Signed)
Pt called office, gave her [place] appt. ?

## 2021-12-26 ENCOUNTER — Ambulatory Visit (HOSPITAL_COMMUNITY)
Admission: RE | Admit: 2021-12-26 | Discharge: 2021-12-26 | Disposition: A | Discharge status: Home / Self Care | Payer: Medicare HMO | Source: Ambulatory Visit | Attending: Gastroenterology | Admitting: Gastroenterology

## 2021-12-26 ENCOUNTER — Other Ambulatory Visit: Payer: Self-pay

## 2021-12-26 DIAGNOSIS — K7689 Other specified diseases of liver: Secondary | ICD-10-CM | POA: Diagnosis not present

## 2021-12-26 DIAGNOSIS — K7581 Nonalcoholic steatohepatitis (NASH): Secondary | ICD-10-CM | POA: Diagnosis not present

## 2021-12-26 DIAGNOSIS — D509 Iron deficiency anemia, unspecified: Secondary | ICD-10-CM | POA: Diagnosis not present

## 2021-12-27 LAB — CBC WITH DIFFERENTIAL/PLATELET
Basophils Absolute: 0.1 10*3/uL (ref 0.0–0.2)
Basos: 1 %
EOS (ABSOLUTE): 0.3 10*3/uL (ref 0.0–0.4)
Eos: 7 %
Hematocrit: 37.3 % (ref 34.0–46.6)
Hemoglobin: 12.4 g/dL (ref 11.1–15.9)
Immature Grans (Abs): 0 10*3/uL (ref 0.0–0.1)
Immature Granulocytes: 0 %
Lymphocytes Absolute: 2.2 10*3/uL (ref 0.7–3.1)
Lymphs: 43 %
MCH: 29.4 pg (ref 26.6–33.0)
MCHC: 33.2 g/dL (ref 31.5–35.7)
MCV: 88 fL (ref 79–97)
Monocytes Absolute: 0.3 10*3/uL (ref 0.1–0.9)
Monocytes: 6 %
Neutrophils Absolute: 2.2 10*3/uL (ref 1.4–7.0)
Neutrophils: 43 %
Platelets: 265 10*3/uL (ref 150–450)
RBC: 4.22 x10E6/uL (ref 3.77–5.28)
RDW: 14 % (ref 11.7–15.4)
WBC: 5.1 10*3/uL (ref 3.4–10.8)

## 2021-12-27 LAB — HEPATIC FUNCTION PANEL
ALT: 67 IU/L — ABNORMAL HIGH (ref 0–32)
AST: 68 IU/L — ABNORMAL HIGH (ref 0–40)
Albumin: 4.5 g/dL (ref 3.7–4.7)
Alkaline Phosphatase: 71 IU/L (ref 44–121)
Bilirubin Total: 0.6 mg/dL (ref 0.0–1.2)
Bilirubin, Direct: 0.14 mg/dL (ref 0.00–0.40)
Total Protein: 7 g/dL (ref 6.0–8.5)

## 2021-12-27 LAB — IRON,TIBC AND FERRITIN PANEL
Ferritin: 28 ng/mL (ref 15–150)
Iron Saturation: 18 % (ref 15–55)
Iron: 61 ug/dL (ref 27–139)
Total Iron Binding Capacity: 340 ug/dL (ref 250–450)
UIBC: 279 ug/dL (ref 118–369)

## 2022-01-21 ENCOUNTER — Other Ambulatory Visit: Payer: Self-pay | Admitting: Family Medicine

## 2022-01-23 ENCOUNTER — Telehealth: Payer: Self-pay | Admitting: Family Medicine

## 2022-01-23 DIAGNOSIS — E119 Type 2 diabetes mellitus without complications: Secondary | ICD-10-CM

## 2022-01-23 DIAGNOSIS — E1169 Type 2 diabetes mellitus with other specified complication: Secondary | ICD-10-CM

## 2022-01-23 DIAGNOSIS — R7989 Other specified abnormal findings of blood chemistry: Secondary | ICD-10-CM

## 2022-01-23 DIAGNOSIS — E039 Hypothyroidism, unspecified: Secondary | ICD-10-CM

## 2022-01-23 NOTE — Telephone Encounter (Signed)
Last labs 12/26/21: CBC, Liver, Fe, TIBC, Ferritin ?                11/09/21: Lipid, Liver, HgbA1c ?

## 2022-01-23 NOTE — Telephone Encounter (Signed)
A1c, lipid, liver, metabolic 7, TSH ?

## 2022-01-23 NOTE — Telephone Encounter (Signed)
Blood work ordered in [employer]. Patient aware  ?

## 2022-01-23 NOTE — Telephone Encounter (Signed)
Patient wanting to know if she needs labs done for 6 month follow up on 4/25 please advise ?

## 2022-01-25 DIAGNOSIS — E785 Hyperlipidemia, unspecified: Secondary | ICD-10-CM | POA: Diagnosis not present

## 2022-01-25 DIAGNOSIS — E119 Type 2 diabetes mellitus without complications: Secondary | ICD-10-CM | POA: Diagnosis not present

## 2022-01-25 DIAGNOSIS — E1169 Type 2 diabetes mellitus with other specified complication: Secondary | ICD-10-CM | POA: Diagnosis not present

## 2022-01-25 DIAGNOSIS — R7989 Other specified abnormal findings of blood chemistry: Secondary | ICD-10-CM | POA: Diagnosis not present

## 2022-01-25 DIAGNOSIS — E039 Hypothyroidism, unspecified: Secondary | ICD-10-CM | POA: Diagnosis not present

## 2022-01-26 LAB — HEPATIC FUNCTION PANEL
ALT: 99 IU/L — ABNORMAL HIGH (ref 0–32)
AST: 99 IU/L — ABNORMAL HIGH (ref 0–40)
Albumin: 4.5 g/dL (ref 3.7–4.7)
Alkaline Phosphatase: 79 IU/L (ref 44–121)
Bilirubin Total: 0.4 mg/dL (ref 0.0–1.2)
Bilirubin, Direct: 0.11 mg/dL (ref 0.00–0.40)
Total Protein: 7 g/dL (ref 6.0–8.5)

## 2022-01-26 LAB — BASIC METABOLIC PANEL
BUN/Creatinine Ratio: 12 (ref 12–28)
BUN: 11 mg/dL (ref 8–27)
CO2: 24 mmol/L (ref 20–29)
Calcium: 9.6 mg/dL (ref 8.7–10.3)
Chloride: 98 mmol/L (ref 96–106)
Creatinine, Ser: 0.92 mg/dL (ref 0.57–1.00)
Glucose: 215 mg/dL — ABNORMAL HIGH (ref 70–99)
Potassium: 3.7 mmol/L (ref 3.5–5.2)
Sodium: 139 mmol/L (ref 134–144)
eGFR: 66 mL/min/{1.73_m2} (ref >59)

## 2022-01-26 LAB — HEMOGLOBIN A1C
Est. average glucose Bld gHb Est-mCnc: 197 mg/dL
Hgb A1c MFr Bld: 8.5 % — ABNORMAL HIGH (ref 4.8–5.6)

## 2022-01-26 LAB — LIPID PANEL
Chol/HDL Ratio: 8.9 ratio — ABNORMAL HIGH (ref 0.0–4.4)
Cholesterol, Total: 268 mg/dL — ABNORMAL HIGH (ref 100–199)
HDL: 30 mg/dL — ABNORMAL LOW (ref >39)
LDL Chol Calc (NIH): 170 mg/dL — ABNORMAL HIGH (ref 0–99)
Triglycerides: 353 mg/dL — ABNORMAL HIGH (ref 0–149)
VLDL Cholesterol Cal: 68 mg/dL — ABNORMAL HIGH (ref 5–40)

## 2022-01-26 LAB — TSH: TSH: 2.78 u[IU]/mL (ref 0.450–4.500)

## 2022-01-29 ENCOUNTER — Other Ambulatory Visit: Payer: Self-pay | Admitting: Family Medicine

## 2022-02-01 DIAGNOSIS — H25813 Combined forms of age-related cataract, bilateral: Secondary | ICD-10-CM | POA: Diagnosis not present

## 2022-02-01 DIAGNOSIS — H5200 Hypermetropia, unspecified eye: Secondary | ICD-10-CM | POA: Diagnosis not present

## 2022-02-01 DIAGNOSIS — E7800 Pure hypercholesterolemia, unspecified: Secondary | ICD-10-CM | POA: Diagnosis not present

## 2022-02-01 DIAGNOSIS — Z0100 Encounter for examination of eyes and vision without abnormal findings: Secondary | ICD-10-CM | POA: Diagnosis not present

## 2022-02-01 DIAGNOSIS — E119 Type 2 diabetes mellitus without complications: Secondary | ICD-10-CM | POA: Diagnosis not present

## 2022-02-01 DIAGNOSIS — I10 Essential (primary) hypertension: Secondary | ICD-10-CM | POA: Diagnosis not present

## 2022-02-01 LAB — HM DIABETES EYE EXAM

## 2022-02-06 ENCOUNTER — Ambulatory Visit (INDEPENDENT_AMBULATORY_CARE_PROVIDER_SITE_OTHER): Payer: Medicare HMO | Admitting: Family Medicine

## 2022-02-06 ENCOUNTER — Encounter: Payer: Self-pay | Admitting: Family Medicine

## 2022-02-06 VITALS — BP 129/77 | HR 89 | Temp 98.8°F | Wt 134.2 lb

## 2022-02-06 DIAGNOSIS — E1169 Type 2 diabetes mellitus with other specified complication: Secondary | ICD-10-CM | POA: Diagnosis not present

## 2022-02-06 DIAGNOSIS — K219 Gastro-esophageal reflux disease without esophagitis: Secondary | ICD-10-CM

## 2022-02-06 DIAGNOSIS — E039 Hypothyroidism, unspecified: Secondary | ICD-10-CM | POA: Diagnosis not present

## 2022-02-06 DIAGNOSIS — R7989 Other specified abnormal findings of blood chemistry: Secondary | ICD-10-CM | POA: Diagnosis not present

## 2022-02-06 DIAGNOSIS — E785 Hyperlipidemia, unspecified: Secondary | ICD-10-CM

## 2022-02-06 MED ORDER — METFORMIN HCL 500 MG PO TABS: ORAL_TABLET | ORAL | 1 refills | Status: DC

## 2022-02-06 MED ORDER — HYDROCODONE-ACETAMINOPHEN 7.5-325 MG PO TABS: ORAL_TABLET | ORAL | 0 refills | Status: DC

## 2022-02-06 NOTE — Progress Notes (Signed)
? ?Subjective:  ? ? Patient ID: Nicole Lam, female    DOB: [birth year], 71 y.o.   MRN: [id number] ? ?Diabetes ?She presents for her follow-up diabetic visit. She has type 2 diabetes mellitus. Hypoglycemia symptoms include dizziness. Associated symptoms include blurred vision. (Pt is getting new glasses) There are no hypoglycemic complications. There are no diabetic complications. She does not see a podiatrist.Eye exam is current.  ?Pt following up on DM and hypothyroidism.  ?Hypothyroidism, unspecified type ? ?Gastroesophageal reflux disease, unspecified whether esophagitis present ? ?Hyperlipidemia associated with type 2 diabetes mellitus ([place]) - Plan: Hemoglobin X9J, Basic Metabolic Panel (BMET) ? ?Abnormal LFTs - Plan: Hemoglobin Y7W, Basic Metabolic Panel (BMET) ? ? ?Review of Systems  ?Eyes:  Positive for blurred vision.  ?Neurological:  Positive for dizziness.  ? ?   ?Objective:  ? Physical Exam ? ?General-in no acute distress ?Eyes-no discharge ?Lungs-respiratory rate normal, CTA ?CV-no murmurs,RRR ?Extremities skin warm dry no edema ?Neuro grossly normal ?Behavior normal, alert ? ?Results for orders placed or performed in visit on 01/23/22  ?Lipid panel  ?Result Value Ref Range  ? Cholesterol, Total 268 (H) 100 - 199 mg/dL  ? Triglycerides 353 (H) 0 - 149 mg/dL  ? HDL 30 (L) >39 mg/dL  ? VLDL Cholesterol Cal 68 (H) 5 - 40 mg/dL  ? LDL Chol Calc (NIH) 170 (H) 0 - 99 mg/dL  ? Chol/HDL Ratio 8.9 (H) 0.0 - 4.4 ratio  ?Hepatic function panel  ?Result Value Ref Range  ? Total Protein 7.0 6.0 - 8.5 g/dL  ? Albumin 4.5 3.7 - 4.7 g/dL  ? Bilirubin Total 0.4 0.0 - 1.2 mg/dL  ? Bilirubin, Direct 0.11 0.00 - 0.40 mg/dL  ? Alkaline Phosphatase 79 44 - 121 IU/L  ? AST 99 (H) 0 - 40 IU/L  ? ALT 99 (H) 0 - 32 IU/L  ?Basic metabolic panel  ?Result Value Ref Range  ? Glucose 215 (H) 70 - 99 mg/dL  ? BUN 11 8 - 27 mg/dL  ? Creatinine, Ser 0.92 0.57 - 1.00 mg/dL  ? eGFR 66 >59 mL/min/1.73  ? BUN/Creatinine Ratio 12 12 - 28  ?  Sodium 139 134 - 144 mmol/L  ? Potassium 3.7 3.5 - 5.2 mmol/L  ? Chloride 98 96 - 106 mmol/L  ? CO2 24 20 - 29 mmol/L  ? Calcium 9.6 8.7 - 10.3 mg/dL  ?TSH  ?Result Value Ref Range  ? TSH 2.780 0.450 - 4.500 uIU/mL  ?Hemoglobin A1c  ?Result Value Ref Range  ? Hgb A1c MFr Bld 8.5 (H) 4.8 - 5.6 %  ? Est. average glucose Bld gHb Est-mCnc 197 mg/dL  ? ? ? ?   ?Assessment & Plan:  ?1. Hypothyroidism, unspecified type ?Continue thyroid medicine.  Lab work looks good. ? ?2. Gastroesophageal reflux disease, unspecified whether esophagitis present ?Patient is trying to do the best she can with the Protonix.  Seems to be helping her. ? ?3. Hyperlipidemia associated with type 2 diabetes mellitus ([place]) ?Patient does tolerate zetia but does not tolerate statins.  In addition to this does not want to be on injectables currently we did discuss this though including Repatha ?- Hemoglobin G9F ?- Basic Metabolic Panel (BMET) ? ?4. Abnormal LFTs ?Liver enzymes are elevated.  She has [name].  Very important for the patient to work hard on healthy diet regular activity ?- Hemoglobin A2Z ?- Basic Metabolic Panel (BMET) ? ?As for the diabetes she states she will do better with her medications  and watch diet closely if the A1c is not improved by the time she follows up we will need to initiate other measures possibly insulin ? ?A short course of hydrocodone was sent in to help her with pain she has chronic back pain she uses this sporadically.  Refill was given cautioned drowsiness precautions discussed ?

## 2022-02-09 ENCOUNTER — Ambulatory Visit: Payer: Medicare HMO | Admitting: Family Medicine

## 2022-02-21 ENCOUNTER — Other Ambulatory Visit: Payer: Self-pay

## 2022-02-21 MED ORDER — EZETIMIBE 10 MG PO TABS: 10.0000 mg | ORAL_TABLET | Freq: Every day | ORAL | 1 refills | Status: DC

## 2022-03-07 ENCOUNTER — Other Ambulatory Visit: Payer: Self-pay | Admitting: Family Medicine

## 2022-04-25 ENCOUNTER — Other Ambulatory Visit: Payer: Self-pay | Admitting: Family Medicine

## 2022-05-10 DIAGNOSIS — E785 Hyperlipidemia, unspecified: Secondary | ICD-10-CM | POA: Diagnosis not present

## 2022-05-10 DIAGNOSIS — E1169 Type 2 diabetes mellitus with other specified complication: Secondary | ICD-10-CM | POA: Diagnosis not present

## 2022-05-10 DIAGNOSIS — R7989 Other specified abnormal findings of blood chemistry: Secondary | ICD-10-CM | POA: Diagnosis not present

## 2022-05-11 LAB — BASIC METABOLIC PANEL
BUN/Creatinine Ratio: 14 (ref 12–28)
BUN: 13 mg/dL (ref 8–27)
CO2: 23 mmol/L (ref 20–29)
Calcium: 9.7 mg/dL (ref 8.7–10.3)
Chloride: 98 mmol/L (ref 96–106)
Creatinine, Ser: 0.93 mg/dL (ref 0.57–1.00)
Glucose: 208 mg/dL — ABNORMAL HIGH (ref 70–99)
Potassium: 4.3 mmol/L (ref 3.5–5.2)
Sodium: 138 mmol/L (ref 134–144)
eGFR: 65 mL/min/{1.73_m2} (ref >59)

## 2022-05-11 LAB — HEMOGLOBIN A1C
Est. average glucose Bld gHb Est-mCnc: 217 mg/dL
Hgb A1c MFr Bld: 9.2 % — ABNORMAL HIGH (ref 4.8–5.6)

## 2022-05-22 ENCOUNTER — Other Ambulatory Visit: Payer: Self-pay

## 2022-05-22 MED ORDER — METFORMIN HCL 1000 MG PO TABS: 1000.0000 mg | ORAL_TABLET | Freq: Two times a day (BID) | ORAL | 3 refills | Status: DC

## 2022-05-29 ENCOUNTER — Encounter: Payer: Self-pay | Admitting: *Deleted

## 2022-06-06 ENCOUNTER — Telehealth: Payer: Self-pay | Admitting: Family Medicine

## 2022-06-06 NOTE — Telephone Encounter (Signed)
That would be fine to do.  Metformin 500 mg take 2 tablets twice daily may have 90-day supply with 1 refill

## 2022-06-06 NOTE — Telephone Encounter (Signed)
[facility]-pharmacist from [place] calling to see if PCP could change current Metformin script back to 500 mg. Pharmacy spoke with patient and pt states that the Metformin 1000 mg she is taking twice daily with food is difficult to swallow. Requesting to go to Metformin 500 mg 2 tablets BID. Please advise. Thank you.

## 2022-06-07 MED ORDER — METFORMIN HCL 500 MG PO TABS
ORAL_TABLET | ORAL | 1 refills | Status: DC
Start: 2022-06-07 — End: 2022-08-08

## 2022-06-07 NOTE — Addendum Note (Signed)
Addended by: Vicente Males on: 06/07/2022 11:59 AM   Modules accepted: Orders

## 2022-06-07 NOTE — Telephone Encounter (Signed)
Medication sent to pharmacy  

## 2022-07-06 ENCOUNTER — Telehealth: Payer: Self-pay

## 2022-07-06 ENCOUNTER — Other Ambulatory Visit: Payer: Self-pay | Admitting: Family Medicine

## 2022-07-06 NOTE — Telephone Encounter (Signed)
Last labs completed 05/10/22 BMET and A1C. Please advise. Thank you

## 2022-07-06 NOTE — Telephone Encounter (Signed)
Caller name:Dream Barron Alvine  On DPR? :No  Call back number:[phone]  Provider they see: Wolfgang Phoenix   Reason for call:Pt has upcoming appt Oct 24th and wants to know if there needs to be blood work done

## 2022-07-09 ENCOUNTER — Other Ambulatory Visit: Payer: Self-pay | Admitting: Family Medicine

## 2022-07-09 ENCOUNTER — Other Ambulatory Visit: Payer: Self-pay

## 2022-07-09 ENCOUNTER — Other Ambulatory Visit (HOSPITAL_COMMUNITY): Payer: Self-pay | Admitting: Family Medicine

## 2022-07-09 DIAGNOSIS — E039 Hypothyroidism, unspecified: Secondary | ICD-10-CM

## 2022-07-09 DIAGNOSIS — E119 Type 2 diabetes mellitus without complications: Secondary | ICD-10-CM

## 2022-07-09 DIAGNOSIS — E1169 Type 2 diabetes mellitus with other specified complication: Secondary | ICD-10-CM

## 2022-07-09 DIAGNOSIS — R7989 Other specified abnormal findings of blood chemistry: Secondary | ICD-10-CM

## 2022-07-09 DIAGNOSIS — Z1231 Encounter for screening mammogram for malignant neoplasm of breast: Secondary | ICD-10-CM

## 2022-07-09 NOTE — Telephone Encounter (Signed)
Lipid, liver, metabolic 7, Y9D, urine ACR HTN, elevated liver enzymes, diabetes

## 2022-07-09 NOTE — Telephone Encounter (Signed)
Left message for patient to return the call for additional details and recommendations.

## 2022-07-09 NOTE — Progress Notes (Unsigned)
Referring Provider: Kathyrn Drown, MD Primary Care Physician:  Kathyrn Drown, MD Primary GI Physician: Dr. Gala Romney  No chief complaint on file.   HPI:   Nicole Lam is a 71 y.o. female  presenting today for follow-up with history of IDA, dysphagia with Schatzki's ring s/p dilation June 2021, constipation, suspected [name] with mildly elevated LFTs and Medicare F2/F3 in 2017, undergoing yearly [place].   Ultrasound abdomen with elastography March 2022 with increased liver parenchymal echogenicity without mention of nodularity.  No focal liver lesion.  Spleen size normal.  Median kPa 3.8 with high probability of being normal. Extensive serologies in the past including hepatitis B, hepatitis C, smooth muscle antibody, mitochondrial antibody, ANA, immunoglobulins, ceruloplasmin, TTG IgA all negative.   EGD and colonoscopy June 2021 for IDA with 1 tubular adenoma, small Schatzki's ring s/p dilation, small hiatal hernia, otherwise normal exam. Patient unable to swallow Givens capsule.  Offered endoscopic placement patency capsule followed by endoscopic placement of Givens capsule if appropriate, but patient declined.  Also previously offered CT enterography, but patient also declined due to contrast causing nausea.   Last seen in our office 12/21/2021.  She had quit work on 11/23/2021.  Noted she had some labs in January with elevated LFTs and elevated cholesterol.  She reported rare fast food, rare sweets, rare snack foods.  1 soda for dinner.  Previously taking an urgency with turmeric and ginger, but discontinued after she quit work.  She started [place] in [place] after having labs completed.  No other medication changes.  No signs or symptoms of decompensated liver disease.  No overt GI bleeding.  She was taking ibuprofen a couple times a week for the last couple of weeks as she had been working outside more.  Maintained on PPI daily without reflux symptoms.  This was actually started previously  due to dysphagia with history of Schatzki's ring.  Constipation doing well on MiraLAX daily.  Recommended updating labs, RUQ ultrasound with elastography, counseled on fatty liver, follow-up in 6 months.  Labs completed 12/26/2021.  Hemoglobin 12.4, platelets normal, iron panel within normal limits, HFP with AST 68, ALT 67.   RUQ ultrasound with elastography with increased hepatic parenchymal echogenicity, median K PA 5.6.  Today:   [name]:  Most recent labs 01/25/22 with AST 99, ALT 99, alk phos 79, bilirubin 0.4.  A1C up to 9.2 05/10/22  Anemia:   Constipation:   Past Medical History:  Diagnosis Date   Diabetes ([place])    Fatty liver    with mild LFT elevation, F2/F3 2017   Hemorrhoid    Hyperlipidemia    Hypertension    Hypothyroidism    Lipoma of neck     Past Surgical History:  Procedure Laterality Date   ABDOMINAL HYSTERECTOMY     AGILE CAPSULE N/A 06/14/2020   Procedure: AGILE CAPSULE;  Surgeon: Daneil Dolin, MD; unable to swallow agile   APPENDECTOMY     COLONOSCOPY  05/30/05   WJX:BJYNWG anal papilla, otherwise normal rectum.and colon   COLONOSCOPY N/A 11/04/2015   RMR: normal colonoscopy   COLONOSCOPY WITH PROPOFOL N/A 04/07/2020   Procedure: COLONOSCOPY WITH PROPOFOL;  Surgeon: Daneil Dolin, MD; One 4 mm polyp at ileocecal valve, otherwise normal exam.  Patient was heme-negative.  Pathology with tubular adenoma.  No recommendations to repeat colonoscopy.   DILATION AND CURETTAGE OF UTERUS     ESOPHAGEAL DILATION  04/07/2020   Procedure: ESOPHAGEAL DILATION;  Surgeon: Daneil Dolin,  MD;  [facility];  Service: Endoscopy;;   ESOPHAGOGASTRODUODENOSCOPY (EGD) WITH PROPOFOL N/A 04/07/2020   Procedure: ESOPHAGOGASTRODUODENOSCOPY (EGD) WITH PROPOFOL;  Surgeon: Daneil Dolin, MD;  Small Schatzki's ring s/p dilated, small hiatal hernia, otherwise normal exam.   POLYPECTOMY  04/07/2020   Procedure: POLYPECTOMY;  Surgeon: Daneil Dolin, MD;  Location: [facility];  Service: Endoscopy;;    Current Outpatient Medications  Medication Sig Dispense Refill   Blood Glucose Monitoring Suppl (TRUE METRIX METER) w/Device KIT USE AS DIRECTED 1 kit 0   diclofenac sodium (VOLTAREN) 1 % GEL Apply 4 g topically 4 (four) times daily. (Patient taking differently: Apply 4 g topically 2 (two) times daily as needed (pain).) 5 Tube 5   estradiol (ESTRACE) 0.5 MG tablet TAKE 1 TABLET EVERY DAY 90 tablet 11   ezetimibe (ZETIA) 10 MG tablet TAKE 1 TABLET EVERY DAY 60 tablet 0   glipiZIDE (GLUCOTROL XL) 5 MG 24 hr tablet TAKE 1 TABLET TWICE DAILY 180 tablet 0   HYDROcodone-acetaminophen (NORCO) 7.5-325 MG tablet 1 q4 hours prn 30 tablet 0   ibuprofen (ADVIL) 400 MG tablet Take 400 mg by mouth every 6 (six) hours as needed. As needed for pain     levothyroxine (SYNTHROID) 88 MCG tablet TAKE 1/2 TABLET ON MONDAY AND FRIDAYS AND TAKE 1 WHOLE TABLET ALL OTHER DAYS 78 tablet 1   loratadine (CLARITIN) 10 MG tablet Take 10 mg by mouth daily.     losartan (COZAAR) 50 MG tablet TAKE 1 TABLET EVERY DAY 90 tablet 1   metFORMIN (GLUCOPHAGE) 500 MG tablet Take 2 tablets po BID 360 tablet 1   Multiple Vitamins-Minerals (MULTIVITAMIN WITH MINERALS) tablet Take 1 tablet by mouth daily.     pantoprazole (PROTONIX) 40 MG tablet TAKE 1 TABLET EVERY DAY 90 tablet 1   phenylephrine-shark liver oil-mineral oil-petrolatum (PREPARATION H) 0.25-3-14-71.9 % rectal ointment Place 1 application rectally as needed for hemorrhoids.     polyethylene glycol (MIRALAX / GLYCOLAX) packet Take 17 g by mouth daily.      triamcinolone cream (KENALOG) 0.1 % Apply 1 application topically 2 (two) times daily. 30 g 0   TRUE METRIX BLOOD GLUCOSE TEST test strip TEST BLOOD SUGAR EVERY DAY 100 strip 0   TRUEplus Lancets 33G MISC TEST ONE TO TWO TIMES DAILY AS NEEDED DUE TO FLUCTUATING GLUCOSE 200 each 0   No current facility-administered medications for this visit.    Allergies as of 07/12/2022 - Review  Complete 02/06/2022  Allergen Reaction Noted   Atorvastatin  11/11/2021    Family History  Problem Relation Age of Onset   Heart disease Mother    Hypertension Mother    Birth defects Daughter    Colon cancer Neg Hx    Liver disease Neg Hx    Colon polyps Neg Hx     Social History   Socioeconomic History   Marital status: Married    Spouse name: Holiday representative   Number of children: 4   Years of education: Not on file   Highest education level: Not on file  Occupational History   Not on file  Tobacco Use   Smoking status: Never   Smokeless tobacco: Never  Substance and Sexual Activity   Alcohol use: No    Alcohol/week: 0.0 standard drinks of alcohol   Drug use: No   Sexual activity: Yes    Birth control/protection: Surgical    Comment: hyst  Other Topics Concern   Not on file  Social History Narrative   Total of 4 kids between pt and husband. 3 living.    Married x 28 years.    Social Determinants of Health   Financial Resource Strain: Low Risk  (07/04/2021)   Overall Financial Resource Strain (CARDIA)    Difficulty of Paying Living Expenses: Not hard at all  Food Insecurity: No Food Insecurity (07/04/2021)   Hunger Vital Sign    Worried About Running Out of Food in the Last Year: Never true    Ran Out of Food in the Last Year: Never true  Transportation Needs: No Transportation Needs (07/04/2021)   PRAPARE - Hydrologist (Medical): No    Lack of Transportation (Non-Medical): No  Physical Activity: Inactive (07/04/2021)   Exercise Vital Sign    Days of Exercise per Week: 0 days    Minutes of Exercise per Session: 0 min  Stress: No Stress Concern Present (07/04/2021)   [place]    Feeling of Stress : Not at all  Social Connections: Moderately Integrated (07/04/2021)   Social Connection and Isolation Panel [NHANES]    Frequency of Communication with Friends and Family: Twice a  week    Frequency of Social Gatherings with Friends and Family: Never    Attends Religious Services: More than 4 times per year    Active Member of [employer] or Organizations: Yes    Attends Music therapist: More than 4 times per year    Marital Status: Married    Review of Systems: Gen: Denies fever, chills, cold or flulike symptoms, presyncope, syncope. CV: Denies chest pain, palpitations. Resp: Denies dyspnea, cough. GI: See HPI Heme: See HPI  Physical Exam: There were no vitals taken for this visit. General:   Alert and oriented. No distress noted. Pleasant and cooperative.  Head:  Normocephalic and atraumatic. Eyes:  Conjuctiva clear without scleral icterus. Heart:  S1, S2 present without murmurs appreciated. Lungs:  Clear to auscultation bilaterally. No wheezes, rales, or rhonchi. No distress.  Abdomen:  +BS, soft, non-tender and non-distended. No rebound or guarding. No HSM or masses noted. Msk:  Symmetrical without gross deformities. Normal posture. Extremities:  Without edema. Neurologic:  Alert and  oriented x4 Psych:  Normal mood and affect.    Assessment:     Plan:  ***   Aliene Altes, PA-C [facility] 07/12/2022

## 2022-07-09 NOTE — Telephone Encounter (Signed)
Patient informed per lab orders in place.

## 2022-07-10 ENCOUNTER — Ambulatory Visit (INDEPENDENT_AMBULATORY_CARE_PROVIDER_SITE_OTHER): Payer: Medicare HMO

## 2022-07-10 VITALS — Wt 134.0 lb

## 2022-07-10 DIAGNOSIS — Z0000 Encounter for general adult medical examination without abnormal findings: Secondary | ICD-10-CM | POA: Diagnosis not present

## 2022-07-10 NOTE — Patient Instructions (Signed)
Nicole Lam , Thank you for taking time to come for your Medicare Wellness Visit. I appreciate your ongoing commitment to your health goals. Please review the following plan we discussed and let me know if I can assist you in the future.   Screening recommendations/referrals: Colonoscopy: 04/07/20 Mammogram: scheduled for 08/16/22 Bone Density: 04/04/17 Recommended yearly ophthalmology/optometry visit for glaucoma screening and checkup Recommended yearly dental visit for hygiene and checkup  Vaccinations: Influenza vaccine: 08/11/21 Pneumococcal vaccine: 09/20/16 Tdap vaccine: n/d Shingles vaccine: Zostavax 10/15/13   Covid-19:02/12/20, 03/11/20  Advanced directives: no  Conditions/risks identified: none  Next appointment: Follow up in one year for your annual wellness visit 07/16/23 @ 10:45 am by phone   Preventive Care 65 Years and Older, Female Preventive care refers to lifestyle choices and visits with your health care provider that can promote health and wellness. What does preventive care include? A yearly physical exam. This is also called an annual well check. Dental exams once or twice a year. Routine eye exams. Ask your health care provider how often you should have your eyes checked. Personal lifestyle choices, including: Daily care of your teeth and gums. Regular physical activity. Eating a healthy diet. Avoiding tobacco and drug use. Limiting alcohol use. Practicing safe sex. Taking low-dose aspirin every day. Taking vitamin and mineral supplements as recommended by your health care provider. What happens during an annual well check? The services and screenings done by your health care provider during your annual well check will depend on your age, overall health, lifestyle risk factors, and family history of disease. Counseling  Your health care provider may ask you questions about your: Alcohol use. Tobacco use. Drug use. Emotional well-being. Home and  relationship well-being. Sexual activity. Eating habits. History of falls. Memory and ability to understand (cognition). Work and work Statistician. Reproductive health. Screening  You may have the following tests or measurements: Height, weight, and BMI. Blood pressure. Lipid and cholesterol levels. These may be checked every 5 years, or more frequently if you are over 48 years old. Skin check. Lung cancer screening. You may have this screening every year starting at age 95 if you have a 30-pack-year history of smoking and currently smoke or have quit within the past 15 years. Fecal occult blood test (FOBT) of the stool. You may have this test every year starting at age 47. Flexible sigmoidoscopy or colonoscopy. You may have a sigmoidoscopy every 5 years or a colonoscopy every 10 years starting at age 60. Hepatitis C blood test. Hepatitis B blood test. Sexually transmitted disease (STD) testing. Diabetes screening. This is done by checking your blood sugar (glucose) after you have not eaten for a while (fasting). You may have this done every 1-3 years. Bone density scan. This is done to screen for osteoporosis. You may have this done starting at age 52. Mammogram. This may be done every 1-2 years. Talk to your health care provider about how often you should have regular mammograms. Talk with your health care provider about your test results, treatment options, and if necessary, the need for more tests. Vaccines  Your health care provider may recommend certain vaccines, such as: Influenza vaccine. This is recommended every year. Tetanus, diphtheria, and acellular pertussis (Tdap, Td) vaccine. You may need a Td booster every 10 years. Zoster vaccine. You may need this after age 34. Pneumococcal 13-valent conjugate (PCV13) vaccine. One dose is recommended after age 11. Pneumococcal polysaccharide (PPSV23) vaccine. One dose is recommended after age 67. Talk to your  health care provider  about which screenings and vaccines you need and how often you need them. This information is not intended to replace advice given to you by your health care provider. Make sure you discuss any questions you have with your health care provider. Document Released: 10/28/2015 Document Revised: 06/20/2016 Document Reviewed: 08/02/2015 Elsevier Interactive Patient Education  2017 [place] Prevention in the Home Falls can cause injuries. They can happen to people of all ages. There are many things you can do to make your home safe and to help prevent falls. What can I do on the outside of my home? Regularly fix the edges of walkways and driveways and fix any cracks. Remove anything that might make you trip as you walk through a door, such as a raised step or threshold. Trim any bushes or trees on the path to your home. Use bright outdoor lighting. Clear any walking paths of anything that might make someone trip, such as rocks or tools. Regularly check to see if handrails are loose or broken. Make sure that both sides of any steps have handrails. Any raised decks and porches should have guardrails on the edges. Have any leaves, snow, or ice cleared regularly. Use sand or salt on walking paths during winter. Clean up any spills in your garage right away. This includes oil or grease spills. What can I do in the bathroom? Use night lights. Install grab bars by the toilet and in the tub and shower. Do not use towel bars as grab bars. Use non-skid mats or decals in the tub or shower. If you need to sit down in the shower, use a plastic, non-slip stool. Keep the floor dry. Clean up any water that spills on the floor as soon as it happens. Remove soap buildup in the tub or shower regularly. Attach bath mats securely with double-sided non-slip rug tape. Do not have throw rugs and other things on the floor that can make you trip. What can I do in the bedroom? Use night lights. Make sure  that you have a light by your bed that is easy to reach. Do not use any sheets or blankets that are too big for your bed. They should not hang down onto the floor. Have a firm chair that has side arms. You can use this for support while you get dressed. Do not have throw rugs and other things on the floor that can make you trip. What can I do in the kitchen? Clean up any spills right away. Avoid walking on wet floors. Keep items that you use a lot in easy-to-reach places. If you need to reach something above you, use a strong step stool that has a grab bar. Keep electrical cords out of the way. Do not use floor polish or wax that makes floors slippery. If you must use wax, use non-skid floor wax. Do not have throw rugs and other things on the floor that can make you trip. What can I do with my stairs? Do not leave any items on the stairs. Make sure that there are handrails on both sides of the stairs and use them. Fix handrails that are broken or loose. Make sure that handrails are as long as the stairways. Check any carpeting to make sure that it is firmly attached to the stairs. Fix any carpet that is loose or worn. Avoid having throw rugs at the top or bottom of the stairs. If you do have throw rugs, attach them  to the floor with carpet tape. Make sure that you have a light switch at the top of the stairs and the bottom of the stairs. If you do not have them, ask someone to add them for you. What else can I do to help prevent falls? Wear shoes that: Do not have high heels. Have rubber bottoms. Are comfortable and fit you well. Are closed at the toe. Do not wear sandals. If you use a stepladder: Make sure that it is fully opened. Do not climb a closed stepladder. Make sure that both sides of the stepladder are locked into place. Ask someone to hold it for you, if possible. Clearly mark and make sure that you can see: Any grab bars or handrails. First and last steps. Where the edge of  each step is. Use tools that help you move around (mobility aids) if they are needed. These include: Canes. Walkers. Scooters. Crutches. Turn on the lights when you go into a dark area. Replace any light bulbs as soon as they burn out. Set up your furniture so you have a clear path. Avoid moving your furniture around. If any of your floors are uneven, fix them. If there are any pets around you, be aware of where they are. Review your medicines with your doctor. Some medicines can make you feel dizzy. This can increase your chance of falling. Ask your doctor what other things that you can do to help prevent falls. This information is not intended to replace advice given to you by your health care provider. Make sure you discuss any questions you have with your health care provider. Document Released: 07/28/2009 Document Revised: 03/08/2016 Document Reviewed: 11/05/2014 Elsevier Interactive Patient Education  2017 [employer].

## 2022-07-10 NOTE — Progress Notes (Signed)
Virtual Visit via Telephone Note  I connected with  Nicole Lam on 07/10/22 at  3:00 PM EDT by telephone and verified that I am speaking with the correct person using two identifiers.  Location: Patient: home Provider: [facility] Persons participating in the virtual visit: patient/Nurse Health Advisor   I discussed the limitations, risks, security and privacy concerns of performing an evaluation and management service by telephone and the availability of in person appointments. The patient expressed understanding and agreed to proceed.  Interactive audio and video telecommunications were attempted between this nurse and patient, however failed, due to patient having technical difficulties OR patient did not have access to video capability.  We continued and completed visit with audio only.  Some vital signs may be absent or patient reported.   Dionisio David, LPN  Subjective:   Nicole Lam is a 71 y.o. female who presents for Medicare Annual (Subsequent) preventive examination.  Review of Systems     Cardiac Risk Factors include: advanced age (>71mn, >>63women);diabetes mellitus;dyslipidemia;sedentary lifestyle     Objective:    There were no vitals filed for this visit. There is no height or weight on file to calculate BMI.     07/10/2022    3:08 PM 07/04/2021    3:59 PM 04/07/2020    8:37 AM 04/04/2020    1:08 PM 07/15/2017    8:39 AM 05/28/2016    9:14 AM 03/21/2016    8:49 PM  Advanced Directives  Does Patient Have a Medical Advance Directive? _0  No No  Would patient like information on creating a medical advance directive? No - Patient declined No - Patient declined No - Patient declined No - Patient declined No - Patient declined No - patient declined information No - patient declined information    Current Medications (verified) Outpatient Encounter Medications as of 07/10/2022  Medication Sig   Blood Glucose Monitoring Suppl (TRUE METRIX METER) w/Device  KIT USE AS DIRECTED   diclofenac sodium (VOLTAREN) 1 % GEL Apply 4 g topically 4 (four) times daily. (Patient taking differently: Apply 4 g topically 2 (two) times daily as needed (pain).)   estradiol (ESTRACE) 0.5 MG tablet TAKE 1 TABLET EVERY DAY   ezetimibe (ZETIA) 10 MG tablet TAKE 1 TABLET EVERY DAY   glipiZIDE (GLUCOTROL XL) 5 MG 24 hr tablet TAKE 1 TABLET TWICE DAILY   HYDROcodone-acetaminophen (NORCO) 7.5-325 MG tablet 1 q4 hours prn   ibuprofen (ADVIL) 400 MG tablet Take 400 mg by mouth every 6 (six) hours as needed. As needed for pain   levothyroxine (SYNTHROID) 88 MCG tablet TAKE 1/2 TABLET ON MONDAY AND FRIDAYS AND TAKE 1 WHOLE TABLET ALL OTHER DAYS   losartan (COZAAR) 50 MG tablet TAKE 1 TABLET EVERY DAY   metFORMIN (GLUCOPHAGE) 500 MG tablet Take 2 tablets po BID   Multiple Vitamins-Minerals (MULTIVITAMIN WITH MINERALS) tablet Take 1 tablet by mouth daily.   pantoprazole (PROTONIX) 40 MG tablet TAKE 1 TABLET EVERY DAY   phenylephrine-shark liver oil-mineral oil-petrolatum (PREPARATION H) 0.25-3-14-71.9 % rectal ointment Place 1 application rectally as needed for hemorrhoids.   polyethylene glycol (MIRALAX / GLYCOLAX) packet Take 17 g by mouth daily.    triamcinolone cream (KENALOG) 0.1 % Apply 1 application topically 2 (two) times daily.   TRUE METRIX BLOOD GLUCOSE TEST test strip TEST BLOOD SUGAR EVERY DAY   TRUEplus Lancets 33G MISC TEST ONE TO TWO TIMES DAILY AS NEEDED DUE TO FLUCTUATING GLUCOSE   loratadine (  CLARITIN) 10 MG tablet Take 10 mg by mouth daily. (Patient not taking: Reported on 07/10/2022)   No facility-administered encounter medications on file as of 07/10/2022.    Allergies (verified) Atorvastatin   History: Past Medical History:  Diagnosis Date   Diabetes ([place])    Fatty liver    with mild LFT elevation, F2/F3 2017   Hemorrhoid    Hyperlipidemia    Hypertension    Hypothyroidism    Lipoma of neck    Past Surgical History:  Procedure Laterality Date    ABDOMINAL HYSTERECTOMY     AGILE CAPSULE N/A 06/14/2020   Procedure: AGILE CAPSULE;  Surgeon: Daneil Dolin, MD; unable to swallow agile   APPENDECTOMY     COLONOSCOPY  05/30/05   WUJ:[id number] anal papilla, otherwise normal rectum.and colon   COLONOSCOPY N/A 11/04/2015   RMR: normal colonoscopy   COLONOSCOPY WITH PROPOFOL N/A 04/07/2020   Procedure: COLONOSCOPY WITH PROPOFOL;  Surgeon: Daneil Dolin, MD; One 4 mm polyp at ileocecal valve, otherwise normal exam.  Patient was heme-negative.  Pathology with tubular adenoma.  No recommendations to repeat colonoscopy.   DILATION AND CURETTAGE OF UTERUS     ESOPHAGEAL DILATION  04/07/2020   Procedure: ESOPHAGEAL DILATION;  Surgeon: Daneil Dolin, MD;  [facility];  Service: Endoscopy;;   ESOPHAGOGASTRODUODENOSCOPY (EGD) WITH PROPOFOL N/A 04/07/2020   Procedure: ESOPHAGOGASTRODUODENOSCOPY (EGD) WITH PROPOFOL;  Surgeon: Daneil Dolin, MD;  Small Schatzki's ring s/p dilated, small hiatal hernia, otherwise normal exam.   POLYPECTOMY  04/07/2020   Procedure: POLYPECTOMY;  Surgeon: Daneil Dolin, MD;  [facility];  Service: Endoscopy;;   Family History  Problem Relation Age of Onset   Heart disease Mother    Hypertension Mother    Birth defects Daughter    Colon cancer Neg Hx    Liver disease Neg Hx    Colon polyps Neg Hx    Social History   Socioeconomic History   Marital status: Married    Spouse name: Holiday representative   Number of children: 4   Years of education: Not on file   Highest education level: Not on file  Occupational History   Not on file  Tobacco Use   Smoking status: Never   Smokeless tobacco: Never  Substance and Sexual Activity   Alcohol use: No    Alcohol/week: 0.0 standard drinks of alcohol   Drug use: No   Sexual activity: Yes    Birth control/protection: Surgical    Comment: hyst  Other Topics Concern   Not on file  Social History Narrative   Total of 4 kids between pt and husband. 3 living.     Married x 28 years.    Social Determinants of Health   Financial Resource Strain: Low Risk  (07/10/2022)   Overall Financial Resource Strain (CARDIA)    Difficulty of Paying Living Expenses: Not hard at all  Food Insecurity: No Food Insecurity (07/10/2022)   Hunger Vital Sign    Worried About Running Out of Food in the Last Year: Never true    Ran Out of Food in the Last Year: Never true  Transportation Needs: No Transportation Needs (07/10/2022)   PRAPARE - Hydrologist (Medical): No    Lack of Transportation (Non-Medical): No  Physical Activity: Insufficiently Active (07/10/2022)   Exercise Vital Sign    Days of Exercise per Week: 3 days    Minutes of Exercise per Session: 10 min  Stress: No Stress Concern Present (07/10/2022)   [place]    Feeling of Stress : Only a little  Social Connections: Moderately Integrated (07/10/2022)   Social Connection and Isolation Panel [NHANES]    Frequency of Communication with Friends and Family: Once a week    Frequency of Social Gatherings with Friends and Family: Three times a week    Attends Religious Services: More than 4 times per year    Active Member of Clubs or Organizations: No    Attends Archivist Meetings: Never    Marital Status: Married    Tobacco Counseling Counseling given: Not Answered   Clinical Intake:  Pre-visit preparation completed: Yes  Pain : No/denies pain     Nutritional Risks: None Diabetes: Yes CBG done?: No Did pt. bring in CBG monitor from home?: No  How often do you need to have someone help you when you read instructions, pamphlets, or other written materials from your doctor or pharmacy?: 1 - Never  Diabetic?yes Nutrition Risk Assessment:  Has the patient had any N/V/D within the last 2 months?  No  Does the patient have any non-healing wounds?  No  Has the patient had any unintentional  weight loss or weight gain?  No   Diabetes:  Is the patient diabetic?  Yes  If diabetic, was a CBG obtained today?  No  Did the patient bring in their glucometer from home?  No  How often do you monitor your CBG's? occasionally.   Financial Strains and Diabetes Management:  Are you having any financial strains with the device, your supplies or your medication? No .  Does the patient want to be seen by Chronic Care Management for management of their diabetes?  No  Would the patient like to be referred to a Nutritionist or for Diabetic Management?  No   Diabetic Exams:  Diabetic Eye Exam: Completed 02/01/22. Pt has been advised about the importance in completing this exam. Diabetic Foot Exam: Completed 04/25/21. Pt has been advised about the importance in completing this exam.   Interpreter Needed?: No  Information entered by :: Kirke Shaggy, LPN   Activities of Daily Living    07/10/2022    3:08 PM  In your present state of health, do you have any difficulty performing the following activities:  Hearing? 0  Vision? 0  Difficulty concentrating or making decisions? 0  Walking or climbing stairs? 1  Dressing or bathing? 0  Doing errands, shopping? 0  Preparing Food and eating ? N  Using the Toilet? N  In the past six months, have you accidently leaked urine? N  Do you have problems with loss of bowel control? N  Managing your Medications? N  Managing your Finances? N  Housekeeping or managing your Housekeeping? N    Patient Care Team: Kathyrn Drown, MD as PCP - General (Family Medicine) Gala Romney Cristopher Estimable, MD as Consulting Physician (Gastroenterology)  Indicate any recent Medical Services you may have received from other than [facility] providers in the past year (date may be approximate).     Assessment:   This is a routine wellness examination for [place].  Hearing/Vision screen Hearing Screening - Comments:: No aids Vision Screening - Comments:: Wears glasses- [facility] [place] issues and exercise activities discussed: Current Exercise Habits: Home exercise routine, Type of exercise: walking, Time (Minutes): 10, Frequency (Times/Week): 3, Weekly Exercise (Minutes/Week): 30, Intensity: Mild  Goals Addressed             This Visit's Progress    DIET - EAT MORE FRUITS AND VEGETABLES         Depression Screen    07/10/2022    3:06 PM 08/11/2021    1:19 PM 07/04/2021    3:54 PM 04/25/2021    4:01 PM 02/10/2021    1:22 PM 09/04/2019    8:36 AM 12/18/2017    8:27 AM  PHQ 2/9 Scores  PHQ - 2 Score 0 0 0 0 0 0 0  PHQ- 9 Score 0          Fall Risk    07/10/2022    3:08 PM 08/11/2021    1:18 PM 07/04/2021    4:00 PM 04/25/2021    4:01 PM 02/10/2021    1:22 PM  [place] in the past year? 0 1 0 0 0  Number falls in past yr: 0 0 0  0  Injury with Fall? 0 0 0  0  Risk for fall due to : No Fall Risks Impaired balance/gait Other (Comment);Orthopedic patient No Fall Risks No Fall Risks  Follow up Falls prevention discussed;Falls evaluation completed Falls evaluation completed  Falls evaluation completed Falls evaluation completed    FALL RISK PREVENTION PERTAINING TO THE HOME:  Any stairs in or around the home? Yes  If so, are there any without handrails? No  Home free of loose throw rugs in walkways, pet beds, electrical cords, etc? Yes  Adequate lighting in your home to reduce risk of falls? Yes   ASSISTIVE DEVICES UTILIZED TO PREVENT FALLS:  Life alert? No  Use of a cane, walker or w/c? No  Grab bars in the bathroom? No  Shower chair or bench in shower? No  Elevated toilet seat or a handicapped toilet? No    Cognitive Function:        07/10/2022    3:09 PM 07/04/2021    4:07 PM  6CIT Screen  What Year? 0 points 0 points  What month? 0 points 0 points  What time? 0 points 0 points  Count back from 20 0 points 0 points  Months in reverse 0 points 0 points  Repeat phrase 0 points 0 points  Total Score 0 points  0 points    Immunizations Immunization History  Administered Date(s) Administered   Fluad Quad(high Dose 65+) 08/11/2020, 08/11/2021   Influenza Split 07/16/2015   Influenza, High Dose Seasonal PF 08/13/2019   Influenza,inj,Quad PF,6+ Mos 08/08/2018   Influenza-Unspecified 118-Jun-1952, 08/03/2016, 08/13/2019   Moderna Sars-Covid-2 Vaccination 02/12/2020, 03/11/2020   Pneumococcal Conjugate-13 09/16/2015   Pneumococcal Polysaccharide-23 09/20/2016   Zoster, Live 10/15/2013    TDAP status: Due, Education has been provided regarding the importance of this vaccine. Advised may receive this vaccine at local pharmacy or Health Dept. Aware to provide a copy of the vaccination record if obtained from local pharmacy or Health Dept. Verbalized acceptance and understanding.  Flu Vaccine status: Up to date  Pneumococcal vaccine status: Up to date  Covid-19 vaccine status: Completed vaccines  Qualifies for Shingles Vaccine? Yes   Zostavax completed Yes   Shingrix Completed?: No.    Education has been provided regarding the importance of this vaccine. Patient has been advised to call insurance company to determine out of pocket expense if they have not yet received this vaccine. Advised may also receive vaccine at local pharmacy or Health Dept. Verbalized acceptance and understanding.  Screening Tests Health Maintenance  Topic Date Due   TETANUS/TDAP  Never done   Zoster Vaccines- Shingrix (1 of 2) Never done   COVID-19 Vaccine (3 - Moderna risk series) 04/08/2020   FOOT EXAM  04/25/2022   INFLUENZA VACCINE  05/15/2022   Diabetic kidney evaluation - Urine ACR  08/04/2022   HEMOGLOBIN A1C  11/10/2022   OPHTHALMOLOGY EXAM  02/02/2023   Diabetic kidney evaluation - GFR measurement  05/11/2023   MAMMOGRAM  08/15/2023   COLONOSCOPY (Pts 45-57yr Insurance coverage will need to be confirmed)  04/07/2030   Pneumonia Vaccine 72 Years old  Completed   DEXA SCAN  Completed   Hepatitis C  Screening  Completed   HPV VACCINES  Aged Out    Health Maintenance  Health Maintenance Due  Topic Date Due   TETANUS/TDAP  Never done   Zoster Vaccines- Shingrix (1 of 2) Never done   COVID-19 Vaccine (3 - Moderna risk series) 04/08/2020   FOOT EXAM  04/25/2022   INFLUENZA VACCINE  05/15/2022   Diabetic kidney evaluation - Urine ACR  08/04/2022    Colorectal cancer screening: Type of screening: Colonoscopy. Completed 04/07/20. Repeat every 10 years  Mammogram status: Completed 08/14/21. Repeat every year- scheduled one 08/16/22  Bone Density status: Completed 04/04/17. Results reflect: Bone density results: NORMAL. Repeat every 5 years.- declined referral  Lung Cancer Screening: (Low Dose CT Chest recommended if Age 263-80years, 30 pack-year currently smoking OR have quit w/in 15years.) does not qualify.   Additional Screening:  Hepatitis C Screening: does qualify; Completed 12/19/20  Vision Screening: Recommended annual ophthalmology exams for early detection of glaucoma and other disorders of the eye. Is the patient up to date with their annual eye exam?  Yes  Who is the provider or what is the name of the office in which the patient attends annual eye exams[place] [facility] [place] is not established with a provider, would they like to be referred to a provider to establish care? No .   Dental Screening: Recommended annual dental exams for proper oral hygiene  Community Resource Referral / Chronic Care Management: CRR required this visit?  No   CCM required this visit?  No      Plan:     I have personally reviewed and noted the following in the patient's chart:   Medical and social history Use of alcohol, tobacco or illicit drugs  Current medications and supplements including opioid prescriptions. Patient is currently taking opioid prescriptions. Information provided to patient regarding non-opioid alternatives. Patient advised to discuss non-opioid treatment plan  with their provider. Functional ability and status Nutritional status Physical activity Advanced directives List of other physicians Hospitalizations, surgeries, and ER visits in previous 12 months Vitals Screenings to include cognitive, depression, and falls Referrals and appointments  In addition, I have reviewed and discussed with patient certain preventive protocols, quality metrics, and best practice recommendations. A written personalized care plan for preventive services as well as general preventive health recommendations were provided to patient.     LDionisio David LPN   [id number]  Nurse Notes: none

## 2022-07-11 ENCOUNTER — Ambulatory Visit (INDEPENDENT_AMBULATORY_CARE_PROVIDER_SITE_OTHER): Payer: Medicare HMO | Admitting: Nurse Practitioner

## 2022-07-11 VITALS — BP 157/82 | HR 92 | Temp 97.5°F | Ht 59.0 in | Wt 133.0 lb

## 2022-07-11 DIAGNOSIS — J3489 Other specified disorders of nose and nasal sinuses: Secondary | ICD-10-CM

## 2022-07-11 DIAGNOSIS — R079 Chest pain, unspecified: Secondary | ICD-10-CM | POA: Diagnosis not present

## 2022-07-11 DIAGNOSIS — J019 Acute sinusitis, unspecified: Secondary | ICD-10-CM | POA: Diagnosis not present

## 2022-07-11 NOTE — Progress Notes (Signed)
Subjective:    Patient ID: Nicole Lam, female    DOB: [birth year], 71 y.o.   MRN: [id number]  HPI  Symptoms started x 3 days ago on Sunday w/ stuffy and runny nose- has taken sudafed, benadryl , alka seltzer, nyquil once each.  Patient denies any fevers, body aches, chills, nausea, vomiting, diarrhea, sore throat, runny nose.  Patient also admits to intermittent left-sided chest pain over the last months.  Patient describes pain as a dull pain.  Patient also admits to having increased shortness of breath.  Patient states that she normally feels pain while sitting.  Patient denies any radiation or heart palpitations. Current cough deep in chest  Review of Systems  HENT:  Positive for congestion and rhinorrhea.   Cardiovascular:  Positive for chest pain.  All other systems reviewed and are negative.      Objective:   Physical Exam Vitals reviewed.  Constitutional:      General: She is not in acute distress.    Appearance: Normal appearance. She is normal weight. She is not ill-appearing, toxic-appearing or diaphoretic.  HENT:     Head: Normocephalic and atraumatic.     Right Ear: Tympanic membrane, ear canal and external ear normal. There is no impacted cerumen.     Left Ear: Tympanic membrane, ear canal and external ear normal. There is no impacted cerumen.     Nose: Nose normal. No congestion or rhinorrhea.     Mouth/Throat:     Mouth: Mucous membranes are moist.     Pharynx: No oropharyngeal exudate or posterior oropharyngeal erythema.  Eyes:     General: No scleral icterus.       Right eye: No discharge.        Left eye: No discharge.     Extraocular Movements: Extraocular movements intact.     Conjunctiva/sclera: Conjunctivae normal.     Pupils: Pupils are equal, round, and reactive to light.  Neck:     Vascular: No carotid bruit.  Cardiovascular:     Rate and Rhythm: Normal rate and regular rhythm.     Pulses: Normal pulses.     Heart sounds: Normal heart  sounds. No murmur heard. Pulmonary:     Effort: Pulmonary effort is normal. No respiratory distress.     Breath sounds: Normal breath sounds. No wheezing.  Musculoskeletal:     Cervical back: Normal range of motion and neck supple. No rigidity or tenderness.     Comments: Grossly intact  Lymphadenopathy:     Cervical: No cervical adenopathy.  Skin:    General: Skin is warm.     Capillary Refill: Capillary refill takes less than 2 seconds.  Neurological:     Mental Status: She is alert.     Comments: Grossly intact  Psychiatric:        Mood and Affect: Mood normal.        Behavior: Behavior normal.           Assessment & Plan:   1. Acute rhinosinusitis -Likely viral or allergy related -May continue taking over-the-counter medications to help with symptoms - COVID-19, Flu A+B and RSV -Return to clinic if symptoms do not improve or worsen  2. Chest pain, unspecified type -Etiology unclear at this time -Possible costochondritis secondary to coughing -Possible poor R progression noted on EKG -Recommend patient be evaluated by cardiology for intermittent chest pain - EKG 12-Lead - Ambulatory referral to Cardiology -If chest pain returns or worsens return to clinic for  evaluation or go to emergency room.    Note:  This document was prepared using Dragon voice recognition software and may include unintentional dictation errors. Note - This record has been created using [employer].  Chart creation errors have been sought, but may not always  have been located. Such creation errors do not reflect on  the standard of medical care.

## 2022-07-12 ENCOUNTER — Ambulatory Visit: Payer: Medicare HMO | Admitting: Gastroenterology

## 2022-07-12 ENCOUNTER — Encounter: Payer: Self-pay | Admitting: Gastroenterology

## 2022-07-12 ENCOUNTER — Encounter: Payer: Self-pay | Admitting: Nurse Practitioner

## 2022-07-12 VITALS — BP 163/78 | HR 77 | Temp 97.7°F | Ht 59.0 in | Wt 133.0 lb

## 2022-07-12 DIAGNOSIS — D509 Iron deficiency anemia, unspecified: Secondary | ICD-10-CM | POA: Diagnosis not present

## 2022-07-12 DIAGNOSIS — K5900 Constipation, unspecified: Secondary | ICD-10-CM | POA: Diagnosis not present

## 2022-07-12 DIAGNOSIS — K7581 Nonalcoholic steatohepatitis (NASH): Secondary | ICD-10-CM | POA: Diagnosis not present

## 2022-07-12 NOTE — Patient Instructions (Addendum)
Keep your upcoming appointment for routine blood work with Dr. Wolfgang Phoenix.  I have added a CBC and iron panel to follow-up on your iron deficiency anemia.  Continue Pantoprazole 40 mg daily.   Continue MiraLAX daily.   Instructions for fatty liver/elevated liver enzymes: Recommend 1-2# weight loss per week until ideal body weight through exercise & diet. Low fat/cholesterol diet.   Avoid sweets, sodas, fruit juices, sweetened beverages like tea, etc. Gradually increase exercise from 15 min daily up to 1 hr per day 5 days/week. Avoid alcohol use. Avoid over-the-counter supplements and herbal teas.  It is also very important to keep your diabetes under good control.  We will plan to see you back in about 6 months.  Do not hesitate to call sooner if you have questions or concerns.  Aliene Altes, PA-C [facility] Gastroenterology

## 2022-07-13 LAB — COVID-19, FLU A+B AND RSV
Influenza A, NAA: NOT DETECTED
Influenza B, NAA: NOT DETECTED
RSV, NAA: NOT DETECTED
SARS-CoV-2, NAA: NOT DETECTED

## 2022-08-01 DIAGNOSIS — D509 Iron deficiency anemia, unspecified: Secondary | ICD-10-CM | POA: Diagnosis not present

## 2022-08-01 DIAGNOSIS — E785 Hyperlipidemia, unspecified: Secondary | ICD-10-CM | POA: Diagnosis not present

## 2022-08-01 DIAGNOSIS — E119 Type 2 diabetes mellitus without complications: Secondary | ICD-10-CM | POA: Diagnosis not present

## 2022-08-01 DIAGNOSIS — E1169 Type 2 diabetes mellitus with other specified complication: Secondary | ICD-10-CM | POA: Diagnosis not present

## 2022-08-01 DIAGNOSIS — R7989 Other specified abnormal findings of blood chemistry: Secondary | ICD-10-CM | POA: Diagnosis not present

## 2022-08-02 LAB — CBC WITH DIFFERENTIAL/PLATELET
Basophils Absolute: 0.1 10*3/uL (ref 0.0–0.2)
Basos: 1 %
EOS (ABSOLUTE): 0.3 10*3/uL (ref 0.0–0.4)
Eos: 5 %
Hematocrit: 39.7 % (ref 34.0–46.6)
Hemoglobin: 13.1 g/dL (ref 11.1–15.9)
Immature Grans (Abs): 0 10*3/uL (ref 0.0–0.1)
Immature Granulocytes: 0 %
Lymphocytes Absolute: 2.4 10*3/uL (ref 0.7–3.1)
Lymphs: 38 %
MCH: 29 pg (ref 26.6–33.0)
MCHC: 33 g/dL (ref 31.5–35.7)
MCV: 88 fL (ref 79–97)
Monocytes Absolute: 0.4 10*3/uL (ref 0.1–0.9)
Monocytes: 6 %
Neutrophils Absolute: 3.2 10*3/uL (ref 1.4–7.0)
Neutrophils: 50 %
Platelets: 266 10*3/uL (ref 150–450)
RBC: 4.51 x10E6/uL (ref 3.77–5.28)
RDW: 14 % (ref 11.7–15.4)
WBC: 6.4 10*3/uL (ref 3.4–10.8)

## 2022-08-02 LAB — MICROALBUMIN / CREATININE URINE RATIO
Creatinine, Urine: 16.6 mg/dL
Microalb/Creat Ratio: <18 mg/g creat (ref 0–29)
Microalbumin, Urine: <3 ug/mL

## 2022-08-02 LAB — HEPATIC FUNCTION PANEL
ALT: 86 IU/L — ABNORMAL HIGH (ref 0–32)
AST: 99 IU/L — ABNORMAL HIGH (ref 0–40)
Albumin: 4.7 g/dL (ref 3.8–4.8)
Alkaline Phosphatase: 83 IU/L (ref 44–121)
Bilirubin Total: 0.8 mg/dL (ref 0.0–1.2)
Bilirubin, Direct: 0.17 mg/dL (ref 0.00–0.40)
Total Protein: 7.8 g/dL (ref 6.0–8.5)

## 2022-08-02 LAB — BASIC METABOLIC PANEL
BUN/Creatinine Ratio: 13 (ref 12–28)
BUN: 13 mg/dL (ref 8–27)
CO2: 23 mmol/L (ref 20–29)
Calcium: 9.8 mg/dL (ref 8.7–10.3)
Chloride: 97 mmol/L (ref 96–106)
Creatinine, Ser: 0.99 mg/dL (ref 0.57–1.00)
Glucose: 171 mg/dL — ABNORMAL HIGH (ref 70–99)
Potassium: 3.9 mmol/L (ref 3.5–5.2)
Sodium: 138 mmol/L (ref 134–144)
eGFR: 61 mL/min/{1.73_m2} (ref >59)

## 2022-08-02 LAB — LIPID PANEL
Chol/HDL Ratio: 8.3 ratio — ABNORMAL HIGH (ref 0.0–4.4)
Cholesterol, Total: 264 mg/dL — ABNORMAL HIGH (ref 100–199)
HDL: 32 mg/dL — ABNORMAL LOW (ref >39)
LDL Chol Calc (NIH): 175 mg/dL — ABNORMAL HIGH (ref 0–99)
Triglycerides: 296 mg/dL — ABNORMAL HIGH (ref 0–149)
VLDL Cholesterol Cal: 57 mg/dL — ABNORMAL HIGH (ref 5–40)

## 2022-08-02 LAB — IRON,TIBC AND FERRITIN PANEL
Ferritin: 64 ng/mL (ref 15–150)
Iron Saturation: 29 % (ref 15–55)
Iron: 105 ug/dL (ref 27–139)
Total Iron Binding Capacity: 358 ug/dL (ref 250–450)
UIBC: 253 ug/dL (ref 118–369)

## 2022-08-02 LAB — HEMOGLOBIN A1C
Est. average glucose Bld gHb Est-mCnc: 192 mg/dL
Hgb A1c MFr Bld: 8.3 % — ABNORMAL HIGH (ref 4.8–5.6)

## 2022-08-06 ENCOUNTER — Other Ambulatory Visit: Payer: Self-pay | Admitting: Obstetrics & Gynecology

## 2022-08-06 ENCOUNTER — Encounter: Payer: Self-pay | Admitting: Internal Medicine

## 2022-08-06 ENCOUNTER — Ambulatory Visit: Payer: Medicare HMO | Attending: Internal Medicine | Admitting: Internal Medicine

## 2022-08-06 VITALS — BP 136/74 | HR 74 | Ht <= 58 in | Wt 132.0 lb

## 2022-08-06 DIAGNOSIS — R079 Chest pain, unspecified: Secondary | ICD-10-CM | POA: Diagnosis not present

## 2022-08-06 DIAGNOSIS — G720 Drug-induced myopathy: Secondary | ICD-10-CM

## 2022-08-06 DIAGNOSIS — Z789 Other specified health status: Secondary | ICD-10-CM | POA: Diagnosis not present

## 2022-08-06 DIAGNOSIS — T466X5A Adverse effect of antihyperlipidemic and antiarteriosclerotic drugs, initial encounter: Secondary | ICD-10-CM

## 2022-08-06 DIAGNOSIS — I10 Essential (primary) hypertension: Secondary | ICD-10-CM | POA: Diagnosis not present

## 2022-08-06 MED ORDER — METOPROLOL TARTRATE 100 MG PO TABS: 100.0000 mg | ORAL_TABLET | Freq: Once | ORAL | 0 refills | Status: DC

## 2022-08-06 MED ORDER — NITROGLYCERIN 0.4 MG SL SUBL: 0.4000 mg | SUBLINGUAL_TABLET | SUBLINGUAL | 3 refills | Status: DC | PRN

## 2022-08-06 NOTE — Patient Instructions (Addendum)
Medication Instructions:  Your physician has recommended you make the following change in your medication:  Start nitroglycerin as needed for chest pain Continue all other medications as directed  Labwork: none  Testing/Procedures:   Your cardiac CT will be scheduled at one of the below locations:   [facility] [address] [place],  [address] [phone]  If scheduled at [facility], please arrive at the [facility] and Children's Entrance (Entrance C2) of [facility] 30 minutes prior to test start time. You can use the FREE valet parking offered at entrance C (encouraged to control the heart rate for the test)  Proceed to the [facility] Radiology Department (first floor) to check-in and test prep.  All radiology patients and guests should use entrance C2 at [facility], accessed from [facility], even though the hospital's physical address listed is [address]     Please follow these instructions carefully (unless otherwise directed):  Please hold metformin and glipizide the morning of the test and 48 hours after the test.  On the Night Before the Test: Be sure to Drink plenty of water. Do not consume any caffeinated/decaffeinated beverages or chocolate 12 hours prior to your test. Do not take any Zyrtec 12 hours prior to your test.  On the Day of the Test: Drink plenty of water until 1 hour prior to the test. Do not eat any food 4 hours prior to the test. You may take your regular medications prior to the test.  Take metoprolol (Lopressor) 100 mg two hours prior to test. HOLD Furosemide/Hydrochlorothiazide morning of the test. FEMALES- please wear underwire-free bra if available, avoid dresses & tight clothing      After the Test: Drink plenty of water. After receiving IV contrast, you may experience a mild flushed feeling. This is normal. On occasion, you may experience a mild rash up to 24 hours  after the test. This is not dangerous. If this occurs, you can take Benadryl 25 mg and increase your fluid intake. If you experience trouble breathing, this can be serious. If it is severe call 911 IMMEDIATELY. If it is mild, please call our office. If you take any of these medications: Glipizide/Metformin, Avandament, Glucavance, please do not take 48 hours after completing test unless otherwise instructed.  We will call to schedule your test 2-4 weeks out understanding that some insurance companies will need an authorization prior to the service being performed.   For non-scheduling related questions, please contact the cardiac imaging nurse navigator should you have any questions/concerns: Marchia Bond, Cardiac Imaging Nurse Navigator Gordy Clement, Cardiac Imaging Nurse Navigator [facility] Direct Office Dial: [phone]   For scheduling needs, including cancellations and rescheduling, please call [place], [phone].   Follow-Up:  Your physician recommends that you schedule a follow-up appointment in: 2 months  Any Other Special Instructions Will Be Listed Below (If Applicable).  If you need a refill on your cardiac medications before your next appointment, please call your pharmacy.

## 2022-08-06 NOTE — Progress Notes (Signed)
Cardiology Office Note  Date: 08/06/2022   ID: Nicole, Lam 11/23/1950, MRN [id number]  PCP:  Kathyrn Drown, MD  Cardiologist:  Chalmers Guest, MD Electrophysiologist:  None   Reason for Office Visit: Evaluation chest pain at the request of Ameduite, NP   History of Present Illness: Nicole Lam is a 71 y.o. female known to have HTN, DM 2, fatty liver, HLD, hypothyroidism presented to the cardiology clinic for evaluation of chest pain at the request of Ameduite, NP. Patient stated that she has had chest soreness after she played with her grandkids.  Did chest soreness did not happen while playing with the grandkids however occurred after a few days.  It lasted for a day or 2.  She did not have however sometimes she gets a different quality of chest pain which she is not able to describe during exertion and also at rest.  She also reported isolated neck tightness on the left side with no underlying chest discomfort. She did do not have any SL NTG tablets with her. She also stated she has baseline shortness of breath after she quit working in February of this year. Denied any associated LE swelling, palpitations, syncope, claudication. Has positional dizziness.  She denies smoking cigarettes, alcohol use, illicit drug use.  However she had secondhand exposure to cigarette smoke from her first husband and current husband.  She has a strong family history of stents and CABG in her mother and grandmother.  She is not able to recall when they underwent the stents/CABG, states probably in their 89s.  Patient has HLD, LDL was 175 five days ago. She has been on simvastatin 20 mg initially that was increased to 40 mg however was stopped due to worsened muscle cramps. Later, her PCP started her on atorvastatin 20 mg and she noticed muscle cramps worsening as well. Currently she is only on ezetimibe.  Past Medical History:  Diagnosis Date   Diabetes ([place])    Fatty liver    with mild  LFT elevation, F2/F3 2017   Hemorrhoid    Hyperlipidemia    Hypertension    Hypothyroidism    Lipoma of neck     Past Surgical History:  Procedure Laterality Date   ABDOMINAL HYSTERECTOMY     AGILE CAPSULE N/A 06/14/2020   Procedure: AGILE CAPSULE;  Surgeon: Daneil Dolin, MD; unable to swallow agile   APPENDECTOMY     COLONOSCOPY  05/30/05   WUJ:WJXBJY anal papilla, otherwise normal rectum.and colon   COLONOSCOPY N/A 11/04/2015   RMR: normal colonoscopy   COLONOSCOPY WITH PROPOFOL N/A 04/07/2020   Procedure: COLONOSCOPY WITH PROPOFOL;  Surgeon: Daneil Dolin, MD; One 4 mm polyp at ileocecal valve, otherwise normal exam.  Patient was heme-negative.  Pathology with tubular adenoma.  No recommendations to repeat colonoscopy.   DILATION AND CURETTAGE OF UTERUS     ESOPHAGEAL DILATION  04/07/2020   Procedure: ESOPHAGEAL DILATION;  Surgeon: Daneil Dolin, MD;  Location: [facility];  Service: Endoscopy;;   ESOPHAGOGASTRODUODENOSCOPY (EGD) WITH PROPOFOL N/A 04/07/2020   Procedure: ESOPHAGOGASTRODUODENOSCOPY (EGD) WITH PROPOFOL;  Surgeon: Daneil Dolin, MD;  Small Schatzki's ring s/p dilated, small hiatal hernia, otherwise normal exam.   POLYPECTOMY  04/07/2020   Procedure: POLYPECTOMY;  Surgeon: Daneil Dolin, MD;  Location: [facility];  Service: Endoscopy;;    Current Outpatient Medications  Medication Sig Dispense Refill   Blood Glucose Monitoring Suppl (TRUE METRIX METER) w/Device KIT USE AS  DIRECTED 1 kit 0   diclofenac sodium (VOLTAREN) 1 % GEL Apply 4 g topically 4 (four) times daily. (Patient taking differently: Apply 4 g topically 2 (two) times daily as needed (pain).) 5 Tube 5   estradiol (ESTRACE) 0.5 MG tablet TAKE 1 TABLET EVERY DAY 90 tablet 11   ezetimibe (ZETIA) 10 MG tablet TAKE 1 TABLET EVERY DAY 90 tablet 0   glipiZIDE (GLUCOTROL XL) 5 MG 24 hr tablet TAKE 1 TABLET TWICE DAILY 180 tablet 0   HYDROcodone-acetaminophen (NORCO) 7.5-325 MG tablet 1 q4 hours prn 30  tablet 0   ibuprofen (ADVIL) 400 MG tablet Take 400 mg by mouth every 6 (six) hours as needed. As needed for pain     levothyroxine (SYNTHROID) 88 MCG tablet TAKE 1/2 TABLET ON MONDAY AND FRIDAYS AND TAKE 1 WHOLE TABLET ALL OTHER DAYS 78 tablet 1   losartan (COZAAR) 50 MG tablet TAKE 1 TABLET EVERY DAY 90 tablet 1   metFORMIN (GLUCOPHAGE) 500 MG tablet Take 2 tablets po BID 360 tablet 1   metoprolol tartrate (LOPRESSOR) 100 MG tablet Take 1 tablet (100 mg total) by mouth once for 1 dose. 1 tablet 0   Multiple Vitamins-Minerals (MULTIVITAMIN WITH MINERALS) tablet Take 1 tablet by mouth daily.     nitroGLYCERIN (NITROSTAT) 0.4 MG SL tablet Place 1 tablet (0.4 mg total) under the tongue every 5 (five) minutes x 3 doses as needed for chest pain. 90 tablet 3   pantoprazole (PROTONIX) 40 MG tablet TAKE 1 TABLET EVERY DAY 90 tablet 1   phenylephrine-shark liver oil-mineral oil-petrolatum (PREPARATION H) 0.25-3-14-71.9 % rectal ointment Place 1 application rectally as needed for hemorrhoids.     polyethylene glycol (MIRALAX / GLYCOLAX) packet Take 17 g by mouth daily.      TRUE METRIX BLOOD GLUCOSE TEST test strip TEST BLOOD SUGAR EVERY DAY 100 strip 0   TRUEplus Lancets 33G MISC TEST ONE TO TWO TIMES DAILY AS NEEDED DUE TO FLUCTUATING GLUCOSE 200 each 0   No current facility-administered medications for this visit.   Allergies:  Atorvastatin   Social History: The patient  reports that she has never smoked. She has never used smokeless tobacco. She reports that she does not drink alcohol and does not use drugs.   Family History: The patient's family history includes Birth defects in her daughter; Heart disease in her mother; Hypertension in her mother.   ROS:  Please see the history of present illness. Otherwise, complete review of systems is positive for none.  All other systems are reviewed and negative.   Physical Exam: VS:  BP 136/74 (BP Location: Left Arm, Cuff Size: Normal)   Pulse 74   Ht  4' 10"  (1.473 m)   Wt 132 lb (59.9 kg)   SpO2 97%   BMI 27.59 kg/m , BMI Body mass index is 27.59 kg/m.  Wt Readings from Last 3 Encounters:  08/06/22 132 lb (59.9 kg)  07/12/22 133 lb (60.3 kg)  07/11/22 133 lb (60.3 kg)    General: Patient appears comfortable at rest. HEENT: Conjunctiva and lids normal, oropharynx clear with moist mucosa. Neck: Supple, no elevated JVP or carotid bruits, no thyromegaly. Lungs: Clear to auscultation, nonlabored breathing at rest. Cardiac: Regular rate and rhythm, no S3 or significant systolic murmur, no pericardial rub. Abdomen: Soft, nontender, no hepatomegaly, bowel sounds present, no guarding or rebound. Extremities: No pitting edema, distal pulses 2+. Skin: Warm and dry. Musculoskeletal: No kyphosis. Neuropsychiatric: Alert and oriented x3, affect grossly appropriate.  ECG:  An ECG dated 08/06/2022 was personally reviewed today and demonstrated:  Normal sinus rhythm  Recent Labwork: 01/25/2022: TSH 2.780 08/01/2022: ALT 86; AST 99; BUN 13; Creatinine, Ser 0.99; Hemoglobin 13.1; Platelets 266; Potassium 3.9; Sodium 138     Component Value Date/Time   CHOL 264 (H) 08/01/2022 1050   TRIG 296 (H) 08/01/2022 1050   HDL 32 (L) 08/01/2022 1050   CHOLHDL 8.3 (H) 08/01/2022 1050   CHOLHDL 4.3 10/16/2014 1033   VLDL 24 10/16/2014 1033   LDLCALC 175 (H) 08/01/2022 1050    Other Studies Reviewed Today: No studies on file  Assessment and Plan: Patient is a 71 year old F known to have HTN, DM 2, HLD, fatty liver presented to the cardiology clinic as a new patient visit for evaluation of chest pain at the request of Ameduite, NP.  #Possibly cardiac chest pain/atypical chest pain Plan -Obtain CTA cardiac for coronary artery evaluation due to atypical chest pain and strong family history of ASCVD. -SL NTG 0.4 mg as needed.  ER precautions for chest pain provided. -Obtain 2D Echo  #HLD with DM2, currently not at goal Plan -LDL was 175 five days  ago.  Patient is not a candidate for any statin therapy due to borderline liver enzyme elevation and also worsening muscle cramps with simvastatin/atorvastatin in the past. She will benefit from PCSK9 inhibitors or inclisiran. Recommend initiating PCSK9 inhibitors. Pharm.D. referral for PCSK9 inhibitors initiation.  If cost is prohibitive, can go with inclisiran although this will be the last option.  She will need nurse visit for Repatha/inclisiran administration.  #HTN, controlled Plan -Continue losartan 50 mg once daily  I have spent a total of 45 minutes with patient reviewing chart , telemetry, EKGs, labs and examining patient as well as establishing an assessment and plan that was discussed with the patient.  > 50% of time was spent in direct patient care.      Medication Adjustments/Labs and Tests Ordered: Current medicines are reviewed at length with the patient today.  Concerns regarding medicines are outlined above.   Tests Ordered: Orders Placed This Encounter  Procedures   CT CORONARY MORPH W/CTA COR W/SCORE W/CA W/CM &/OR WO/CM   AMB Referral to [facility] Pharm-[facility]   EKG 12-Lead   ECHOCARDIOGRAM COMPLETE    Medication Changes: Meds ordered this encounter  Medications   nitroGLYCERIN (NITROSTAT) 0.4 MG SL tablet    Sig: Place 1 tablet (0.4 mg total) under the tongue every 5 (five) minutes x 3 doses as needed for chest pain.    Dispense:  90 tablet    Refill:  3    08/06/22 New Start   metoprolol tartrate (LOPRESSOR) 100 MG tablet    Sig: Take 1 tablet (100 mg total) by mouth once for 1 dose.    Dispense:  1 tablet    Refill:  0    Disposition:  Follow up  2 months  Signed Damica Gravlin Fidel Levy, MD, 08/06/2022 1:02 PM    [facility], [place], [place] [address]

## 2022-08-08 ENCOUNTER — Ambulatory Visit: Payer: Medicare HMO | Attending: Internal Medicine

## 2022-08-08 ENCOUNTER — Ambulatory Visit (INDEPENDENT_AMBULATORY_CARE_PROVIDER_SITE_OTHER): Payer: Medicare HMO | Admitting: Family Medicine

## 2022-08-08 ENCOUNTER — Encounter: Payer: Self-pay | Admitting: Family Medicine

## 2022-08-08 VITALS — BP 132/72 | Wt 132.0 lb

## 2022-08-08 DIAGNOSIS — E1169 Type 2 diabetes mellitus with other specified complication: Secondary | ICD-10-CM | POA: Diagnosis not present

## 2022-08-08 DIAGNOSIS — I10 Essential (primary) hypertension: Secondary | ICD-10-CM | POA: Diagnosis not present

## 2022-08-08 DIAGNOSIS — Z789 Other specified health status: Secondary | ICD-10-CM

## 2022-08-08 DIAGNOSIS — G629 Polyneuropathy, unspecified: Secondary | ICD-10-CM | POA: Diagnosis not present

## 2022-08-08 DIAGNOSIS — Z23 Encounter for immunization: Secondary | ICD-10-CM | POA: Diagnosis not present

## 2022-08-08 DIAGNOSIS — R079 Chest pain, unspecified: Secondary | ICD-10-CM

## 2022-08-08 DIAGNOSIS — M5442 Lumbago with sciatica, left side: Secondary | ICD-10-CM

## 2022-08-08 DIAGNOSIS — E119 Type 2 diabetes mellitus without complications: Secondary | ICD-10-CM

## 2022-08-08 DIAGNOSIS — M542 Cervicalgia: Secondary | ICD-10-CM

## 2022-08-08 DIAGNOSIS — E039 Hypothyroidism, unspecified: Secondary | ICD-10-CM | POA: Diagnosis not present

## 2022-08-08 DIAGNOSIS — E785 Hyperlipidemia, unspecified: Secondary | ICD-10-CM

## 2022-08-08 DIAGNOSIS — G8929 Other chronic pain: Secondary | ICD-10-CM

## 2022-08-08 LAB — ECHOCARDIOGRAM COMPLETE
AR max vel: 1.91 cm2
AV Peak grad: 6.4 mmHg
Ao pk vel: 1.26 m/s
Area-P 1/2: 2.42 cm2
Calc EF: 66.7 %
MV M vel: 3.52 m/s
MV Peak grad: 49.4 mmHg
S' Lateral: 2.24 cm
Single Plane A2C EF: 65.2 %
Single Plane A4C EF: 66.5 %

## 2022-08-08 MED ORDER — GLIPIZIDE ER 5 MG PO TB24: ORAL_TABLET | ORAL | 1 refills | Status: DC

## 2022-08-08 MED ORDER — PANTOPRAZOLE SODIUM 40 MG PO TBEC: 40.0000 mg | DELAYED_RELEASE_TABLET | Freq: Every day | ORAL | 1 refills | Status: DC

## 2022-08-08 MED ORDER — LEVOTHYROXINE SODIUM 88 MCG PO TABS: ORAL_TABLET | ORAL | 1 refills | Status: DC

## 2022-08-08 MED ORDER — HYDROCODONE-ACETAMINOPHEN 7.5-325 MG PO TABS: ORAL_TABLET | ORAL | 0 refills | Status: DC

## 2022-08-08 MED ORDER — METFORMIN HCL 500 MG PO TABS: ORAL_TABLET | ORAL | 1 refills | Status: DC

## 2022-08-08 MED ORDER — LOSARTAN POTASSIUM 50 MG PO TABS: 50.0000 mg | ORAL_TABLET | Freq: Every day | ORAL | 1 refills | Status: DC

## 2022-08-08 MED ORDER — EZETIMIBE 10 MG PO TABS: 10.0000 mg | ORAL_TABLET | Freq: Every day | ORAL | 0 refills | Status: DC

## 2022-08-08 NOTE — Progress Notes (Signed)
   Subjective:    Patient ID: Nicole Lam, female    DOB: 10/29/1950, 71 y.o.   MRN: [id number]  Diabetes She presents for her follow-up diabetic visit. She has type 2 diabetes mellitus. Hypoglycemia symptoms include headaches. Associated symptoms include blurred vision. Eye exam is current.   Pt had ECHO today and EKG on Monday. Has CT scheduled for 08/21/22.  Pt arrives for follow up Hypothyroidism, unspecified type  Hyperlipidemia associated with type 2 diabetes mellitus ([place])  Type 2 diabetes mellitus without complication, without long-term current use of insulin ([place]) - Plan: Hemoglobin A1c  Primary hypertension  Statin intolerance  Peripheral polyneuropathy  Chronic bilateral low back pain with left-sided sciatica  Cervical pain  Need for vaccination - Plan: Flu Vaccine QUAD High Dose(Fluad)  The patient was seen today as part of a comprehensive diabetic check up. Patient has diabetes Patient relates good compliance with taking the medication. We discussed their diet and exercise activities  We also discussed the importance of notifying us if any excessively high glucoses or low sugars.   Patient for blood pressure check up.  The patient does have hypertension.   Patient relates dietary measures try to minimize salt The importance of healthy diet and activity were discussed Patient relates compliance  Patient here for follow-up regarding cholesterol.    Patient relates taking medication on a regular basis Denies problems with medication Importance of dietary measures discussed Regular lab work regarding lipid and liver was checked and if needing additional labs was appropriately ordered   Review of Systems  Eyes:  Positive for blurred vision.  Neurological:  Positive for headaches.       Objective:   Physical Exam General-in no acute distress Eyes-no discharge Lungs-respiratory rate normal, CTA CV-no murmurs,RRR Extremities skin warm dry no  edema Neuro grossly normal Behavior normal, alert         Assessment & Plan:   1. Hypothyroidism, unspecified type Takes her thyroid medicine regular basis TSH previously looked good  2. Hyperlipidemia associated with type 2 diabetes mellitus ([place]) Cannot tolerate statins but cardiology checking into Repatha  3. Type 2 diabetes mellitus without complication, without long-term current use of insulin ([facility]) Subpar A1c patient cannot afford higher cost medicine increase glipizide recheck A1c in 3 months patient warned about possibility of low sugars, if low sugars to notify us Important for patient to eat small amounts frequently throughout the day - Hemoglobin A1c  4. Primary hypertension HTN- patient seen for follow-up regarding HTN.   Diet, medication compliance, appropriate labs and refills were completed.   Importance of keeping blood pressure under good control to lessen the risk of complications discussed Regular follow-up visits discussed   5. Statin intolerance Patient has had myalgias with both statins and atorvastatin and pravastatin plus also elevated liver enzymes  6. Peripheral polyneuropathy She has neuropathy in the feet happens more so at night I am hopeful that if we get the A1c because now this will improve, hold off on any gabapentin or Lyrica currently  7. Chronic bilateral low back pain with left-sided sciatica Important for the patient to do gentle stretching exercises No weakness currently Hydrocodone prescribed caution drowsiness   8. Cervical pain Intermittent pain left side of neck recommend stretching exercises  9. Need for vaccination Today - Flu Vaccine QUAD High Dose(Fluad)  A1c in 3 months Follow-up visit in spring time

## 2022-08-16 ENCOUNTER — Ambulatory Visit (HOSPITAL_COMMUNITY)
Admission: RE | Admit: 2022-08-16 | Discharge: 2022-08-16 | Disposition: A | Discharge status: Home / Self Care | Payer: Medicare HMO | Source: Ambulatory Visit | Attending: Family Medicine | Admitting: Family Medicine

## 2022-08-16 DIAGNOSIS — Z1231 Encounter for screening mammogram for malignant neoplasm of breast: Secondary | ICD-10-CM | POA: Insufficient documentation

## 2022-08-20 ENCOUNTER — Telehealth (HOSPITAL_COMMUNITY): Payer: Self-pay | Admitting: Emergency Medicine

## 2022-08-20 NOTE — Telephone Encounter (Signed)
Pt answered, then hung up. Unable to leave vm [facility] [phone] Office  [phone] Cell

## 2022-08-21 ENCOUNTER — Ambulatory Visit (HOSPITAL_COMMUNITY)
Admission: RE | Admit: 2022-08-21 | Discharge: 2022-08-21 | Disposition: A | Discharge status: Home / Self Care | Payer: Medicare HMO | Source: Ambulatory Visit | Attending: Internal Medicine | Admitting: Internal Medicine

## 2022-08-21 DIAGNOSIS — I2510 Atherosclerotic heart disease of native coronary artery without angina pectoris: Secondary | ICD-10-CM | POA: Diagnosis not present

## 2022-08-21 DIAGNOSIS — R079 Chest pain, unspecified: Secondary | ICD-10-CM | POA: Diagnosis not present

## 2022-08-21 MED ORDER — IOHEXOL 350 MG/ML SOLN
100.0000 mL | Freq: Once | INTRAVENOUS | Status: AC | PRN
  Administered 2022-08-21: 100 mL via INTRAVENOUS

## 2022-08-21 MED ORDER — NITROGLYCERIN 0.4 MG SL SUBL
SUBLINGUAL_TABLET | SUBLINGUAL | Status: AC
  Filled 2022-08-21: qty 2

## 2022-08-21 MED ORDER — NITROGLYCERIN 0.4 MG SL SUBL
0.8000 mg | SUBLINGUAL_TABLET | Freq: Once | SUBLINGUAL | Status: AC
  Administered 2022-08-21: 0.8 mg via SUBLINGUAL

## 2022-08-27 ENCOUNTER — Ambulatory Visit (INDEPENDENT_AMBULATORY_CARE_PROVIDER_SITE_OTHER): Payer: Medicare HMO | Admitting: Nurse Practitioner

## 2022-08-27 VITALS — BP 128/86 | Temp 98.1°F | Ht <= 58 in | Wt 129.8 lb

## 2022-08-27 DIAGNOSIS — R7989 Other specified abnormal findings of blood chemistry: Secondary | ICD-10-CM | POA: Diagnosis not present

## 2022-08-27 DIAGNOSIS — R051 Acute cough: Secondary | ICD-10-CM | POA: Diagnosis not present

## 2022-08-27 DIAGNOSIS — R197 Diarrhea, unspecified: Secondary | ICD-10-CM | POA: Diagnosis not present

## 2022-08-27 NOTE — Progress Notes (Unsigned)
   Subjective:    Patient ID: Nicole Lam, female    DOB: September 09, 1951, 71 y.o.   MRN: [id number]  Cough This is a new problem. The current episode started in the past 7 days. Associated symptoms include headaches and nasal congestion. Associated symptoms comments: diarrhea.   Patient reports runny nose, cough, chills, fever, fatigue, headache x5 days and diarrhea x2 days. Patient denies blood in stool, abdominal pain, abdominal cramping, body aches, nausea, vomiting, mucous in stool.    Review of Systems  Respiratory:  Positive for cough.   Neurological:  Positive for headaches.       Objective:   Physical Exam Vitals reviewed.  Constitutional:      General: She is not in acute distress.    Appearance: Normal appearance. She is normal weight. She is not ill-appearing, toxic-appearing or diaphoretic.  HENT:     Head: Normocephalic and atraumatic.     Right Ear: Tympanic membrane, ear canal and external ear normal. There is no impacted cerumen.     Left Ear: Tympanic membrane, ear canal and external ear normal. There is no impacted cerumen.     Nose: Nose normal. No congestion or rhinorrhea.     Mouth/Throat:     Mouth: Mucous membranes are moist.     Pharynx: Oropharynx is clear. No oropharyngeal exudate or posterior oropharyngeal erythema.  Eyes:     Extraocular Movements: Extraocular movements intact.     Conjunctiva/sclera: Conjunctivae normal.     Pupils: Pupils are equal, round, and reactive to light.  Cardiovascular:     Rate and Rhythm: Normal rate and regular rhythm.     Pulses: Normal pulses.     Heart sounds: Normal heart sounds. No murmur heard. Pulmonary:     Effort: Pulmonary effort is normal. No respiratory distress.     Breath sounds: Normal breath sounds. No wheezing.  Abdominal:     General: Abdomen is flat. Bowel sounds are normal. There is no distension.     Palpations: Abdomen is soft. There is no mass.     Tenderness: There is no abdominal tenderness.  There is no right CVA tenderness, left CVA tenderness, guarding or rebound.     Hernia: No hernia is present.  Musculoskeletal:     Cervical back: Normal range of motion. No rigidity or tenderness.     Comments: Grossly intact  Lymphadenopathy:     Cervical: No cervical adenopathy.  Skin:    General: Skin is warm.     Capillary Refill: Capillary refill takes less than 2 seconds.  Neurological:     Mental Status: She is alert.     Comments: Grossly intact  Psychiatric:        Mood and Affect: Mood normal.        Behavior: Behavior normal.           Assessment & Plan:  1. Acute cough - Likely viral process - CBC with Differential/Platelet - COVID-19, Flu A+B and RSV  2. Diarrhea - Likely viral process - However, will access for leukocytosis and well as for hepatic etiologies. - CMP14+EGFR - Low suspicion for infectious causes - May use OTC pepto bismuth for symptoms relief.  - RTC if symptoms do not resolve or worsen.

## 2022-08-28 LAB — CMP14+EGFR
ALT: 82 IU/L — ABNORMAL HIGH (ref 0–32)
AST: 96 IU/L — ABNORMAL HIGH (ref 0–40)
Albumin/Globulin Ratio: 1.7 (ref 1.2–2.2)
Albumin: 4.5 g/dL (ref 3.8–4.8)
Alkaline Phosphatase: 73 IU/L (ref 44–121)
BUN/Creatinine Ratio: 12 (ref 12–28)
BUN: 11 mg/dL (ref 8–27)
Bilirubin Total: 0.7 mg/dL (ref 0.0–1.2)
CO2: 21 mmol/L (ref 20–29)
Calcium: 9.8 mg/dL (ref 8.7–10.3)
Chloride: 99 mmol/L (ref 96–106)
Creatinine, Ser: 0.89 mg/dL (ref 0.57–1.00)
Globulin, Total: 2.6 g/dL (ref 1.5–4.5)
Glucose: 266 mg/dL — ABNORMAL HIGH (ref 70–99)
Potassium: 4 mmol/L (ref 3.5–5.2)
Sodium: 137 mmol/L (ref 134–144)
Total Protein: 7.1 g/dL (ref 6.0–8.5)
eGFR: 69 mL/min/{1.73_m2} (ref >59)

## 2022-08-28 LAB — CBC WITH DIFFERENTIAL/PLATELET
Basophils Absolute: 0.1 10*3/uL (ref 0.0–0.2)
Basos: 1 %
EOS (ABSOLUTE): 0.4 10*3/uL (ref 0.0–0.4)
Eos: 6 %
Hematocrit: 39.2 % (ref 34.0–46.6)
Hemoglobin: 12.8 g/dL (ref 11.1–15.9)
Immature Grans (Abs): 0 10*3/uL (ref 0.0–0.1)
Immature Granulocytes: 0 %
Lymphocytes Absolute: 1.6 10*3/uL (ref 0.7–3.1)
Lymphs: 25 %
MCH: 28.8 pg (ref 26.6–33.0)
MCHC: 32.7 g/dL (ref 31.5–35.7)
MCV: 88 fL (ref 79–97)
Monocytes Absolute: 0.4 10*3/uL (ref 0.1–0.9)
Monocytes: 6 %
Neutrophils Absolute: 3.9 10*3/uL (ref 1.4–7.0)
Neutrophils: 62 %
Platelets: 261 10*3/uL (ref 150–450)
RBC: 4.44 x10E6/uL (ref 3.77–5.28)
RDW: 13.6 % (ref 11.7–15.4)
WBC: 6.4 10*3/uL (ref 3.4–10.8)

## 2022-08-29 ENCOUNTER — Encounter: Payer: Self-pay | Admitting: Nurse Practitioner

## 2022-09-02 LAB — SPECIMEN STATUS REPORT

## 2022-09-02 LAB — COVID-19, FLU A+B AND RSV
Influenza A, NAA: NOT DETECTED
Influenza B, NAA: NOT DETECTED
RSV, NAA: DETECTED — AB
SARS-CoV-2, NAA: NOT DETECTED

## 2022-09-13 ENCOUNTER — Telehealth: Payer: Self-pay | Admitting: Internal Medicine

## 2022-09-13 DIAGNOSIS — T466X5A Adverse effect of antihyperlipidemic and antiarteriosclerotic drugs, initial encounter: Secondary | ICD-10-CM | POA: Insufficient documentation

## 2022-09-13 NOTE — Telephone Encounter (Signed)
Spoke with Nicole Lam regarding the referral to Pharm D. She wants to cancel the referral at present time. She wants to speak with her PCP after having lab work done in January 2024. Will call back if she decides to keep the referral.

## 2022-09-24 ENCOUNTER — Ambulatory Visit (INDEPENDENT_AMBULATORY_CARE_PROVIDER_SITE_OTHER): Payer: Medicare HMO | Admitting: Family Medicine

## 2022-09-24 VITALS — BP 132/82 | HR 90 | Temp 97.5°F | Ht <= 58 in | Wt 129.0 lb

## 2022-09-24 DIAGNOSIS — J40 Bronchitis, not specified as acute or chronic: Secondary | ICD-10-CM | POA: Insufficient documentation

## 2022-09-24 MED ORDER — PROMETHAZINE-DM 6.25-15 MG/5ML PO SYRP: 5.0000 mL | ORAL_SOLUTION | Freq: Four times a day (QID) | ORAL | 0 refills | Status: DC | PRN

## 2022-09-24 MED ORDER — AZITHROMYCIN 250 MG PO TABS: ORAL_TABLET | ORAL | 0 refills | Status: AC

## 2022-09-24 NOTE — Assessment & Plan Note (Signed)
Given persistent symptoms and lack of improvement there is concern for superimposed bacterial infection.  Placing on azithromycin.  Promethazine DM for cough.

## 2022-09-24 NOTE — Progress Notes (Signed)
Subjective:  Patient ID: Nicole Lam, female    DOB: 1951-07-12  Age: 71 y.o. MRN: [id number]  CC: Chief Complaint  Patient presents with   Cough    Congestion- cough is worse at night and first thing in the morning- patient states she was seen last month and tested positive for RSV and still    HPI:  71 year old female with the below mentioned past medical history presents with respiratory symptoms.  Patient was seen in November and was diagnosed with RSV.  She has had ongoing respiratory symptoms since that time.  However, she has worsened over the past week.  She reports severe sore throat at night and severe cough which is worse at night as well.  She has had headache as well.  No fever.  Taking Mucinex and Alka-Seltzer with no relief.  Patient Active Problem List   Diagnosis Date Noted   Bronchitis 09/24/2022   HTN (hypertension) 08/06/2022   Statin intolerance 08/06/2022   Osteoarthritis, hand 11/24/2021   [name] (nonalcoholic steatohepatitis) 06/21/2021   Hyperlipidemia associated with type 2 diabetes mellitus ([place]) 08/11/2020   Iron deficiency anemia 01/12/2020   External hemorrhoid 07/15/2017   Abnormal LFTs 11/24/2015   DM type 2 (diabetes mellitus, type 2) ([place]) 09/16/2015   Herpes simplex type II infection 06/05/2015   GERD (gastroesophageal reflux disease) 03/30/2014   Chronic back pain 03/30/2014   Peripheral neuropathy 10/20/2013   Hypothyroidism 03/23/2013   Arthralgia 03/23/2013    Social Hx   Social History   Socioeconomic History   Marital status: Married    Spouse name: Holiday representative   Number of children: 4   Years of education: Not on file   Highest education level: Not on file  Occupational History   Not on file  Tobacco Use   Smoking status: Never   Smokeless tobacco: Never  Substance and Sexual Activity   Alcohol use: No    Alcohol/week: 0.0 standard drinks of alcohol   Drug use: No   Sexual activity: Yes    Birth control/protection: Surgical     Comment: hyst  Other Topics Concern   Not on file  Social History Narrative   Total of 4 kids between pt and husband. 3 living.    Married x 28 years.    Social Determinants of Health   Financial Resource Strain: Low Risk  (07/10/2022)   Overall Financial Resource Strain (CARDIA)    Difficulty of Paying Living Expenses: Not hard at all  Food Insecurity: No Food Insecurity (07/10/2022)   Hunger Vital Sign    Worried About Running Out of Food in the Last Year: Never true    Ran Out of Food in the Last Year: Never true  Transportation Needs: No Transportation Needs (07/10/2022)   PRAPARE - Hydrologist (Medical): No    Lack of Transportation (Non-Medical): No  Physical Activity: Insufficiently Active (07/10/2022)   Exercise Vital Sign    Days of Exercise per Week: 3 days    Minutes of Exercise per Session: 10 min  Stress: No Stress Concern Present (07/10/2022)   [place]    Feeling of Stress : Only a little  Social Connections: Moderately Integrated (07/10/2022)   Social Connection and Isolation Panel [NHANES]    Frequency of Communication with Friends and Family: Once a week    Frequency of Social Gatherings with Friends and Family: Three times a week    Attends Religious  Services: More than 4 times per year    Active Member of Clubs or Organizations: No    Attends Archivist Meetings: Never    Marital Status: Married    Review of Systems Per HPI  Objective:  BP 132/82   Pulse 90   Temp (!) 97.5 F (36.4 C) (Oral)   Ht 4' 10"  (1.473 m)   Wt 129 lb (58.5 kg)   SpO2 97%   BMI 26.96 kg/m      09/24/2022    3:04 PM 08/27/2022    9:38 AM 08/21/2022    9:41 AM  BP/Weight  Systolic BP 030 131 438  Diastolic BP 82 86 75  Wt. (Lbs) 129 129.8   BMI 26.96 kg/m2 27.13 kg/m2     Physical Exam Vitals and nursing note reviewed.  Constitutional:      General: She is  not in acute distress.    Appearance: Normal appearance.  HENT:     Head: Normocephalic and atraumatic.     Mouth/Throat:     Pharynx: Oropharynx is clear.  Eyes:     General:        Right eye: No discharge.        Left eye: No discharge.     Conjunctiva/sclera: Conjunctivae normal.  Cardiovascular:     Rate and Rhythm: Normal rate and regular rhythm.  Pulmonary:     Effort: Pulmonary effort is normal.     Breath sounds: Normal breath sounds. No wheezing, rhonchi or rales.  Neurological:     Mental Status: She is alert.     Lab Results  Component Value Date   WBC 6.4 08/27/2022   HGB 12.8 08/27/2022   HCT 39.2 08/27/2022   PLT 261 08/27/2022   GLUCOSE 266 (H) 08/27/2022   CHOL 264 (H) 08/01/2022   TRIG 296 (H) 08/01/2022   HDL 32 (L) 08/01/2022   LDLCALC 175 (H) 08/01/2022   ALT 82 (H) 08/27/2022   AST 96 (H) 08/27/2022   NA 137 08/27/2022   K 4.0 08/27/2022   CL 99 08/27/2022   CREATININE 0.89 08/27/2022   BUN 11 08/27/2022   CO2 21 08/27/2022   TSH 2.780 01/25/2022   INR 1.1 12/19/2020   HGBA1C 8.3 (H) 08/01/2022   MICROALBUR 0.2 10/16/2014     Assessment & Plan:   Problem List Items Addressed This Visit       Respiratory   Bronchitis - Primary    Given persistent symptoms and lack of improvement there is concern for superimposed bacterial infection.  Placing on azithromycin.  Promethazine DM for cough.       Meds ordered this encounter  Medications   azithromycin (ZITHROMAX) 250 MG tablet    Sig: Take 2 tablets on day 1, then 1 tablet daily on days 2 through 5    Dispense:  6 tablet    Refill:  0   promethazine-dextromethorphan (PROMETHAZINE-DM) 6.25-15 MG/5ML syrup    Sig: Take 5 mLs by mouth 4 (four) times daily as needed.    Dispense:  118 mL    Refill:  [facility]

## 2022-10-10 ENCOUNTER — Ambulatory Visit: Payer: Medicare HMO | Admitting: Nurse Practitioner

## 2022-10-16 DIAGNOSIS — H25811 Combined forms of age-related cataract, right eye: Secondary | ICD-10-CM | POA: Diagnosis not present

## 2022-10-16 DIAGNOSIS — H25813 Combined forms of age-related cataract, bilateral: Secondary | ICD-10-CM | POA: Diagnosis not present

## 2022-10-16 DIAGNOSIS — H25812 Combined forms of age-related cataract, left eye: Secondary | ICD-10-CM | POA: Diagnosis not present

## 2022-10-16 DIAGNOSIS — R6889 Other general symptoms and signs: Secondary | ICD-10-CM | POA: Diagnosis not present

## 2022-10-25 DIAGNOSIS — E119 Type 2 diabetes mellitus without complications: Secondary | ICD-10-CM | POA: Diagnosis not present

## 2022-10-26 LAB — HEMOGLOBIN A1C
Est. average glucose Bld gHb Est-mCnc: 166 mg/dL
Hgb A1c MFr Bld: 7.4 % — ABNORMAL HIGH (ref 4.8–5.6)

## 2022-10-27 ENCOUNTER — Encounter: Payer: Self-pay | Admitting: Family Medicine

## 2022-11-01 DIAGNOSIS — H269 Unspecified cataract: Secondary | ICD-10-CM | POA: Diagnosis not present

## 2022-11-01 DIAGNOSIS — H25812 Combined forms of age-related cataract, left eye: Secondary | ICD-10-CM | POA: Diagnosis not present

## 2022-11-01 HISTORY — PX: CATARACT EXTRACTION: SUR2

## 2022-11-15 ENCOUNTER — Encounter: Payer: Self-pay | Admitting: Internal Medicine

## 2022-11-15 ENCOUNTER — Other Ambulatory Visit: Payer: Self-pay | Admitting: Family Medicine

## 2022-11-15 ENCOUNTER — Telehealth: Payer: Self-pay | Admitting: Family Medicine

## 2022-11-15 DIAGNOSIS — E119 Type 2 diabetes mellitus without complications: Secondary | ICD-10-CM

## 2022-11-15 DIAGNOSIS — E039 Hypothyroidism, unspecified: Secondary | ICD-10-CM

## 2022-11-15 DIAGNOSIS — I10 Essential (primary) hypertension: Secondary | ICD-10-CM

## 2022-11-15 DIAGNOSIS — E1169 Type 2 diabetes mellitus with other specified complication: Secondary | ICD-10-CM

## 2022-11-15 MED ORDER — HYDROCODONE-ACETAMINOPHEN 7.5-325 MG PO TABS: ORAL_TABLET | ORAL | 0 refills | Status: DC

## 2022-11-15 NOTE — Telephone Encounter (Signed)
Refill onHYDROcodone-acetaminophen (NORCO) 7.5-325 MG table  called into [facility]

## 2022-11-15 NOTE — Telephone Encounter (Signed)
I did send in her medicine as requested Patient to do lab work before her follow-up office visit V3K, metabolic 7, lipid, liver, TSH Diabetes hypertension hyperlipidemia hypothyroidism

## 2022-11-16 NOTE — Telephone Encounter (Signed)
Lab orders placed and pt is aware. Pt verbalized understanding ?

## 2022-11-22 DIAGNOSIS — H25811 Combined forms of age-related cataract, right eye: Secondary | ICD-10-CM | POA: Diagnosis not present

## 2022-11-22 DIAGNOSIS — H269 Unspecified cataract: Secondary | ICD-10-CM | POA: Diagnosis not present

## 2022-11-29 DIAGNOSIS — H04123 Dry eye syndrome of bilateral lacrimal glands: Secondary | ICD-10-CM | POA: Diagnosis not present

## 2022-11-29 DIAGNOSIS — H524 Presbyopia: Secondary | ICD-10-CM | POA: Diagnosis not present

## 2022-11-30 ENCOUNTER — Other Ambulatory Visit: Payer: Self-pay | Admitting: Family Medicine

## 2022-12-18 NOTE — Progress Notes (Unsigned)
Referring Provider: Kathyrn Drown, MD Primary Care Physician:  Kathyrn Drown, MD Primary GI Physician: Dr. Gala Romney  No chief complaint on file.   HPI:   Nicole Lam is a 72 y.o. female presenting today for follow-up with history of IDA, dysphagia with Schatzki's ring s/p dilation June 2021 and maintained on daily PPI, constipation, suspected [name] with mildly elevated LFTs and Medicare F2/F3 in 2017, undergoing yearly [place].  RUQ ultrasound March 2023 with elastography with increased hepatic parenchymal echogenicity, median K PA 5.6. Extensive serologies in the past including hepatitis B, hepatitis C, smooth muscle antibody, mitochondrial antibody, ANA, immunoglobulins, ceruloplasmin, TTG IgA all negative.   EGD and colonoscopy June 2021 for IDA with 1 tubular adenoma, small Schatzki's ring s/p dilation, small hiatal hernia, otherwise normal exam. Patient unable to swallow Givens capsule.  Offered endoscopic placement patency capsule followed by endoscopic placement of Givens capsule if appropriate, but patient declined.  Also previously offered CT enterography, but patient also declined due to contrast causing nausea.     Last seen in our office 07/12/2022.  Labs in April with ST and ALT at 99.  Hemoglobin A1c was up to 9.2 in July.  Weight was stable.  No symptoms of decompensated liver disease.  No overt GI bleeding.  No iron since March 2022.  Rare use of ibuprofen.  Continues with pantoprazole daily for history of Schatzki's ring.  No significant upper GI symptoms.  Bowels moving well with MiraLAX daily.  Planning to have blood work prior to upcoming office visit with PCP in October.  Recommended updating CBC, iron panel, HFP, continuing current medications, counseled on fatty liver and discussed importance of tight glycemic control.  Needed ultrasound in March 2024 and plan for 38-monthfollow-up.  Hgb 13.1, iron panel wnl, AST 99, ALT 86.  Today:  [name]/elevated LFTs: Most recent labs  08/27/2022: AST 96, ALT 82.  Platelets within normal limits.  Fib-4 2.92 in November 2023. Advanced fibrosis likely (F3-F4) FIB-4 2.07 January 2022. Needs further investigation.  FIB-4 1.36 March 2022 advanced fibrosis excluded  Hemoglobin A1c 7.4 on 10/25/2022 Weight *** Tylenol: Alcohol: OTC supplements/herbal teas  IDA: Hemoglobin 12.8 on 08/27/2022. Iron panel within normal limits 08/01/2022.  Constipation:  Past Medical History:  Diagnosis Date   Diabetes ([place]    Fatty liver    with mild LFT elevation, F2/F3 2017   Hemorrhoid    Hyperlipidemia    Hypertension    Hypothyroidism    Lipoma of neck     Past Surgical History:  Procedure Laterality Date   ABDOMINAL HYSTERECTOMY     AGILE CAPSULE N/A 06/14/2020   Procedure: AGILE CAPSULE;  Surgeon: RDaneil Dolin MD; unable to swallow agile   APPENDECTOMY     COLONOSCOPY  05/30/05   RWU:[id number] papilla, otherwise normal rectum.and colon   COLONOSCOPY N/A 11/04/2015   RMR: normal colonoscopy   COLONOSCOPY WITH PROPOFOL N/A 04/07/2020   Procedure: COLONOSCOPY WITH PROPOFOL;  Surgeon: RDaneil Dolin MD; One 4 mm polyp at ileocecal valve, otherwise normal exam.  Patient was heme-negative.  Pathology with tubular adenoma.  No recommendations to repeat colonoscopy.   DILATION AND CURETTAGE OF UTERUS     ESOPHAGEAL DILATION  04/07/2020   Procedure: ESOPHAGEAL DILATION;  Surgeon: RDaneil Dolin MD;  [facility];  Service: Endoscopy;;   ESOPHAGOGASTRODUODENOSCOPY (EGD) WITH PROPOFOL N/A 04/07/2020   Procedure: ESOPHAGOGASTRODUODENOSCOPY (EGD) WITH PROPOFOL;  Surgeon: RDaneil Dolin MD;  Small Schatzki's ring  s/p dilated, small hiatal hernia, otherwise normal exam.   POLYPECTOMY  04/07/2020   Procedure: POLYPECTOMY;  Surgeon: Daneil Dolin, MD;  Location: [facility];  Service: Endoscopy;;    Current Outpatient Medications  Medication Sig Dispense Refill   Blood Glucose Monitoring Suppl (TRUE METRIX METER)  w/Device KIT USE AS DIRECTED 1 kit 0   diclofenac sodium (VOLTAREN) 1 % GEL Apply 4 g topically 4 (four) times daily. (Patient taking differently: Apply 4 g topically 2 (two) times daily as needed (pain).) 5 Tube 5   estradiol (ESTRACE) 0.5 MG tablet TAKE 1 TABLET EVERY DAY 90 tablet 10   ezetimibe (ZETIA) 10 MG tablet TAKE 1 TABLET EVERY DAY 90 tablet 0   glipiZIDE (GLUCOTROL XL) 5 MG 24 hr tablet 1 in the morning and 2 in the evening 270 tablet 1   HYDROcodone-acetaminophen (NORCO) 7.5-325 MG tablet 1 q4 hours prn 30 tablet 0   ibuprofen (ADVIL) 400 MG tablet Take 400 mg by mouth every 6 (six) hours as needed. As needed for pain     levothyroxine (SYNTHROID) 88 MCG tablet TAKE 1/2 TABLET ON MONDAY AND FRIDAYS AND TAKE 1 WHOLE TABLET ALL OTHER DAYS 78 tablet 1   losartan (COZAAR) 50 MG tablet Take 1 tablet (50 mg total) by mouth daily. 90 tablet 1   metFORMIN (GLUCOPHAGE) 500 MG tablet Take 2 tablets po BID 360 tablet 1   metoprolol tartrate (LOPRESSOR) 100 MG tablet Take 1 tablet (100 mg total) by mouth once for 1 dose. 1 tablet 0   Multiple Vitamins-Minerals (MULTIVITAMIN WITH MINERALS) tablet Take 1 tablet by mouth daily.     nitroGLYCERIN (NITROSTAT) 0.4 MG SL tablet Place 1 tablet (0.4 mg total) under the tongue every 5 (five) minutes x 3 doses as needed for chest pain. 90 tablet 3   pantoprazole (PROTONIX) 40 MG tablet Take 1 tablet (40 mg total) by mouth daily. 90 tablet 1   phenylephrine-shark liver oil-mineral oil-petrolatum (PREPARATION H) 0.25-3-14-71.9 % rectal ointment Place 1 application rectally as needed for hemorrhoids.     polyethylene glycol (MIRALAX / GLYCOLAX) packet Take 17 g by mouth daily.      promethazine-dextromethorphan (PROMETHAZINE-DM) 6.25-15 MG/5ML syrup Take 5 mLs by mouth 4 (four) times daily as needed. 118 mL 0   TRUE METRIX BLOOD GLUCOSE TEST test strip TEST BLOOD SUGAR EVERY DAY 100 strip 0   TRUEplus Lancets 33G MISC TEST ONE TO TWO TIMES DAILY AS NEEDED DUE  TO FLUCTUATING GLUCOSE 200 each 0   No current facility-administered medications for this visit.    Allergies as of 12/20/2022 - Review Complete 09/24/2022  Allergen Reaction Noted   Atorvastatin  11/11/2021    Family History  Problem Relation Age of Onset   Heart disease Mother    Hypertension Mother    Birth defects Daughter    Colon cancer Neg Hx    Liver disease Neg Hx    Colon polyps Neg Hx     Social History   Socioeconomic History   Marital status: Married    Spouse name: Holiday representative   Number of children: 4   Years of education: Not on file   Highest education level: Not on file  Occupational History   Not on file  Tobacco Use   Smoking status: Never   Smokeless tobacco: Never  Substance and Sexual Activity   Alcohol use: No    Alcohol/week: 0.0 standard drinks of alcohol   Drug use: No   Sexual activity:  Yes    Birth control/protection: Surgical    Comment: hyst  Other Topics Concern   Not on file  Social History Narrative   Total of 4 kids between pt and husband. 3 living.    Married x 28 years.    Social Determinants of Health   Financial Resource Strain: Low Risk  (07/10/2022)   Overall Financial Resource Strain (CARDIA)    Difficulty of Paying Living Expenses: Not hard at all  Food Insecurity: No Food Insecurity (07/10/2022)   Hunger Vital Sign    Worried About Running Out of Food in the Last Year: Never true    Ran Out of Food in the Last Year: Never true  Transportation Needs: No Transportation Needs (07/10/2022)   PRAPARE - Hydrologist (Medical): No    Lack of Transportation (Non-Medical): No  Physical Activity: Insufficiently Active (07/10/2022)   Exercise Vital Sign    Days of Exercise per Week: 3 days    Minutes of Exercise per Session: 10 min  Stress: No Stress Concern Present (07/10/2022)   [place]    Feeling of Stress : Only a little  Social  Connections: Moderately Integrated (07/10/2022)   Social Connection and Isolation Panel [NHANES]    Frequency of Communication with Friends and Family: Once a week    Frequency of Social Gatherings with Friends and Family: Three times a week    Attends Religious Services: More than 4 times per year    Active Member of Clubs or Organizations: No    Attends Archivist Meetings: Never    Marital Status: Married    Review of Systems: Gen: Denies fever, chills, anorexia. Denies fatigue, weakness, weight loss.  CV: Denies chest pain, palpitations, syncope, peripheral edema, and claudication. Resp: Denies dyspnea at rest, cough, wheezing, coughing up blood, and pleurisy. GI: Denies vomiting blood, jaundice, and fecal incontinence.   Denies dysphagia or odynophagia. Derm: Denies rash, itching, dry skin Psych: Denies depression, anxiety, memory loss, confusion. No homicidal or suicidal ideation.  Heme: Denies bruising, bleeding, and enlarged lymph nodes.  Physical Exam: There were no vitals taken for this visit. General:   Alert and oriented. No distress noted. Pleasant and cooperative.  Head:  Normocephalic and atraumatic. Eyes:  Conjuctiva clear without scleral icterus. Heart:  S1, S2 present without murmurs appreciated. Lungs:  Clear to auscultation bilaterally. No wheezes, rales, or rhonchi. No distress.  Abdomen:  +BS, soft, non-tender and non-distended. No rebound or guarding. No HSM or masses noted. Msk:  Symmetrical without gross deformities. Normal posture. Extremities:  Without edema. Neurologic:  Alert and  oriented x4 Psych:  Normal mood and affect.    Assessment:     Plan:  ***   Aliene Altes, PA-C [facility]astroenter[facility] 12/20/2022

## 2022-12-20 ENCOUNTER — Encounter: Payer: Self-pay | Admitting: Gastroenterology

## 2022-12-20 ENCOUNTER — Ambulatory Visit: Payer: Medicare HMO | Admitting: Gastroenterology

## 2022-12-20 VITALS — BP 147/79 | HR 75 | Temp 98.1°F | Ht <= 58 in | Wt 126.0 lb

## 2022-12-20 DIAGNOSIS — D509 Iron deficiency anemia, unspecified: Secondary | ICD-10-CM | POA: Diagnosis not present

## 2022-12-20 DIAGNOSIS — K7581 Nonalcoholic steatohepatitis (NASH): Secondary | ICD-10-CM | POA: Diagnosis not present

## 2022-12-20 DIAGNOSIS — K5900 Constipation, unspecified: Secondary | ICD-10-CM

## 2022-12-20 NOTE — Patient Instructions (Addendum)
We will arrange you to have an ultrasound of your liver at [facility] your upcoming appointment with Dr. Wolfgang Phoenix to have your liver enzymes rechecked.\  Instructions for fatty liver: Recommend 1-2# weight loss per week until ideal body weight through exercise & diet. Low fat/cholesterol diet.   Avoid sweets, sodas, fruit juices, sweetened beverages like tea, etc. Gradually increase exercise from 15 min daily up to 1 hr per day 5 days/week. Limit alcohol use.  As we discussed, it is important to have good control of your diabetes due to history of fatty liver.  Continue taking MiraLAX daily for constipation. If you notice that your bowels are not moving quite as well, you can increase MiraLAX to twice a day and also take 1 or 2 stool softeners daily until your bowels start moving well again.  Limit ibuprofen and all NSAIDs as much as possible due do your history of iron deficiency anemia.   Will plan to follow-up with you in 6 months.  Do not hesitate to call sooner if you have questions or concerns.   Aliene Altes, PA-C [facility]

## 2022-12-24 ENCOUNTER — Ambulatory Visit (HOSPITAL_COMMUNITY)
Admission: RE | Admit: 2022-12-24 | Discharge: 2022-12-24 | Disposition: A | Discharge status: Home / Self Care | Payer: Medicare HMO | Source: Ambulatory Visit | Attending: Gastroenterology | Admitting: Gastroenterology

## 2022-12-24 DIAGNOSIS — J019 Acute sinusitis, unspecified: Secondary | ICD-10-CM | POA: Diagnosis not present

## 2022-12-24 DIAGNOSIS — K7689 Other specified diseases of liver: Secondary | ICD-10-CM | POA: Diagnosis not present

## 2022-12-24 DIAGNOSIS — K7581 Nonalcoholic steatohepatitis (NASH): Secondary | ICD-10-CM | POA: Insufficient documentation

## 2022-12-24 DIAGNOSIS — B9789 Other viral agents as the cause of diseases classified elsewhere: Secondary | ICD-10-CM | POA: Diagnosis not present

## 2022-12-24 DIAGNOSIS — H6503 Acute serous otitis media, bilateral: Secondary | ICD-10-CM | POA: Diagnosis not present

## 2023-01-02 ENCOUNTER — Other Ambulatory Visit: Payer: Self-pay | Admitting: Family Medicine

## 2023-01-19 ENCOUNTER — Other Ambulatory Visit: Payer: Self-pay | Admitting: Family Medicine

## 2023-02-07 ENCOUNTER — Ambulatory Visit: Payer: Medicare HMO | Admitting: Family Medicine

## 2023-02-07 DIAGNOSIS — E119 Type 2 diabetes mellitus without complications: Secondary | ICD-10-CM | POA: Diagnosis not present

## 2023-02-07 DIAGNOSIS — E1169 Type 2 diabetes mellitus with other specified complication: Secondary | ICD-10-CM | POA: Diagnosis not present

## 2023-02-07 DIAGNOSIS — E039 Hypothyroidism, unspecified: Secondary | ICD-10-CM | POA: Diagnosis not present

## 2023-02-07 DIAGNOSIS — E785 Hyperlipidemia, unspecified: Secondary | ICD-10-CM | POA: Diagnosis not present

## 2023-02-07 DIAGNOSIS — I10 Essential (primary) hypertension: Secondary | ICD-10-CM | POA: Diagnosis not present

## 2023-02-08 LAB — HEPATIC FUNCTION PANEL
ALT: 88 IU/L — ABNORMAL HIGH (ref 0–32)
AST: 103 IU/L — ABNORMAL HIGH (ref 0–40)
Albumin: 4.7 g/dL (ref 3.8–4.8)
Alkaline Phosphatase: 71 IU/L (ref 44–121)
Bilirubin Total: 0.8 mg/dL (ref 0.0–1.2)
Bilirubin, Direct: 0.18 mg/dL (ref 0.00–0.40)
Total Protein: 7.5 g/dL (ref 6.0–8.5)

## 2023-02-08 LAB — LIPID PANEL
Chol/HDL Ratio: 7.9 ratio — ABNORMAL HIGH (ref 0.0–4.4)
Cholesterol, Total: 275 mg/dL — ABNORMAL HIGH (ref 100–199)
HDL: 35 mg/dL — ABNORMAL LOW (ref >39)
LDL Chol Calc (NIH): 185 mg/dL — ABNORMAL HIGH (ref 0–99)
Triglycerides: 281 mg/dL — ABNORMAL HIGH (ref 0–149)
VLDL Cholesterol Cal: 55 mg/dL — ABNORMAL HIGH (ref 5–40)

## 2023-02-08 LAB — BASIC METABOLIC PANEL
BUN/Creatinine Ratio: 13 (ref 12–28)
BUN: 11 mg/dL (ref 8–27)
CO2: 22 mmol/L (ref 20–29)
Calcium: 9.6 mg/dL (ref 8.7–10.3)
Chloride: 98 mmol/L (ref 96–106)
Creatinine, Ser: 0.82 mg/dL (ref 0.57–1.00)
Glucose: 179 mg/dL — ABNORMAL HIGH (ref 70–99)
Potassium: 4.4 mmol/L (ref 3.5–5.2)
Sodium: 138 mmol/L (ref 134–144)
eGFR: 75 mL/min/{1.73_m2} (ref >59)

## 2023-02-08 LAB — HEMOGLOBIN A1C
Est. average glucose Bld gHb Est-mCnc: 177 mg/dL
Hgb A1c MFr Bld: 7.8 % — ABNORMAL HIGH (ref 4.8–5.6)

## 2023-02-08 LAB — TSH: TSH: 2.12 u[IU]/mL (ref 0.450–4.500)

## 2023-02-11 ENCOUNTER — Telehealth: Payer: Self-pay | Admitting: Family Medicine

## 2023-02-11 NOTE — Telephone Encounter (Signed)
Contacted FEDRA LANTER to schedule their annual wellness visit. Appointment made for 07/19/2023. Thank you,  Judeth Cornfield,  AMB Clinical Support [facility] AWV Program Direct Dial ??[phone]

## 2023-02-12 ENCOUNTER — Ambulatory Visit (INDEPENDENT_AMBULATORY_CARE_PROVIDER_SITE_OTHER): Payer: Medicare HMO | Admitting: Family Medicine

## 2023-02-12 ENCOUNTER — Encounter: Payer: Self-pay | Admitting: Family Medicine

## 2023-02-12 VITALS — BP 134/78 | HR 98 | Ht <= 58 in | Wt 130.4 lb

## 2023-02-12 DIAGNOSIS — E1169 Type 2 diabetes mellitus with other specified complication: Secondary | ICD-10-CM | POA: Diagnosis not present

## 2023-02-12 DIAGNOSIS — E039 Hypothyroidism, unspecified: Secondary | ICD-10-CM | POA: Diagnosis not present

## 2023-02-12 DIAGNOSIS — M25512 Pain in left shoulder: Secondary | ICD-10-CM

## 2023-02-12 DIAGNOSIS — Z7984 Long term (current) use of oral hypoglycemic drugs: Secondary | ICD-10-CM

## 2023-02-12 DIAGNOSIS — G629 Polyneuropathy, unspecified: Secondary | ICD-10-CM

## 2023-02-12 DIAGNOSIS — I10 Essential (primary) hypertension: Secondary | ICD-10-CM

## 2023-02-12 DIAGNOSIS — E119 Type 2 diabetes mellitus without complications: Secondary | ICD-10-CM

## 2023-02-12 DIAGNOSIS — K7400 Hepatic fibrosis, unspecified: Secondary | ICD-10-CM

## 2023-02-12 DIAGNOSIS — E785 Hyperlipidemia, unspecified: Secondary | ICD-10-CM

## 2023-02-12 DIAGNOSIS — K7581 Nonalcoholic steatohepatitis (NASH): Secondary | ICD-10-CM | POA: Diagnosis not present

## 2023-02-12 MED ORDER — HYDROCODONE-ACETAMINOPHEN 7.5-325 MG PO TABS: ORAL_TABLET | ORAL | 0 refills | Status: DC

## 2023-02-12 MED ORDER — NAPROXEN 500 MG PO TABS: 500.0000 mg | ORAL_TABLET | Freq: Two times a day (BID) | ORAL | 2 refills | Status: DC

## 2023-02-12 MED ORDER — GABAPENTIN 100 MG PO CAPS: 100.0000 mg | ORAL_CAPSULE | Freq: Three times a day (TID) | ORAL | 3 refills | Status: DC

## 2023-02-12 NOTE — Patient Instructions (Signed)
It was good to see you today  We updated your refills  As for your left shoulder discomfort- Please try to do stretching exercises spin 10 to 15 minutes at least every other day doing these exercises Also anti-inflammatory twice daily for the next 10 to 14 days If the anti-inflammatory does not help you may stop it If your left shoulder/arm discomfort is not improving over the next 3 weeks let us know we may have to get you in with orthopedics As I discussed with you it is possible that this could be a pinched nerve in your neck or possibly in the shoulder  As for your diabetes is under fair control.  Please cut back on sugary drinks.  Continue your medications as is.  Please do blood work with a urine test in approximately 4 months with a follow-up visit with [place]  As for the burning feet I believe that this is neuropathy due to diabetes.  Gabapentin can help I would recommend starting off 1 in the evening for the first 5 days then 1 twice daily for the next week, if that did not help enough I would recommend increasing the gabapentin to 1 3 times daily.  Please give [place] feedback within the next month how that is doing.  Sometimes with gabapentin we have to adjust the dose to help.  If the gabapentin causes you to feel too sleepy, drug, or not yourself you can stop the medication and then let us know.  Please be cautious the first week when you are on this medicine in regards to driving.  If you are feeling drowsy or any other negative side effects I would recommend stopping the medicine if you should have additional questions or concerns please notify us  Please follow-up 4 months as planned

## 2023-02-12 NOTE — Progress Notes (Unsigned)
Subjective:    Patient ID: Nicole Lam, female    DOB: 26-Oct-1950, 72 y.o.   MRN: [id number]  HPI Very nice patient  Outpatient Encounter Medications as of 02/12/2023  Medication Sig   gabapentin (NEURONTIN) 100 MG capsule Take 1 capsule (100 mg total) by mouth 3 (three) times daily.   metFORMIN (GLUCOPHAGE) 500 MG tablet TAKE 2 TABLETS TWICE DAILY   naproxen (NAPROSYN) 500 MG tablet Take 1 tablet (500 mg total) by mouth 2 (two) times daily with a meal.   Blood Glucose Monitoring Suppl (TRUE METRIX METER) w/Device KIT USE AS DIRECTED   diclofenac sodium (VOLTAREN) 1 % GEL Apply 4 g topically 4 (four) times daily. (Patient taking differently: Apply 4 g topically 2 (two) times daily as needed (pain).)   estradiol (ESTRACE) 0.5 MG tablet TAKE 1 TABLET EVERY DAY   ezetimibe (ZETIA) 10 MG tablet TAKE 1 TABLET EVERY DAY   glipiZIDE (GLUCOTROL XL) 5 MG 24 hr tablet 1 in the morning and 2 in the evening (Patient taking differently: 2 in the morning and 2 in the evening)   HYDROcodone-acetaminophen (NORCO) 7.5-325 MG tablet 1 q4 hours prn   ibuprofen (ADVIL) 400 MG tablet Take 400 mg by mouth every 6 (six) hours as needed. As needed for pain   levothyroxine (SYNTHROID) 88 MCG tablet TAKE 1/2 TABLET ON MONDAY AND FRIDAYS AND TAKE 1 WHOLE TABLET ALL OTHER DAYS   losartan (COZAAR) 50 MG tablet Take 1 tablet (50 mg total) by mouth daily.   metoprolol tartrate (LOPRESSOR) 100 MG tablet Take 1 tablet (100 mg total) by mouth once for 1 dose.   Multiple Vitamins-Minerals (MULTIVITAMIN WITH MINERALS) tablet Take 1 tablet by mouth daily.   nitroGLYCERIN (NITROSTAT) 0.4 MG SL tablet Place 1 tablet (0.4 mg total) under the tongue every 5 (five) minutes x 3 doses as needed for chest pain.   pantoprazole (PROTONIX) 40 MG tablet TAKE 1 TABLET EVERY DAY   phenylephrine-shark liver oil-mineral oil-petrolatum (PREPARATION H) 0.25-3-14-71.9 % rectal ointment Place 1 application rectally as needed for hemorrhoids.    polyethylene glycol (MIRALAX / GLYCOLAX) packet Take 17 g by mouth daily.    promethazine-dextromethorphan (PROMETHAZINE-DM) 6.25-15 MG/5ML syrup Take 5 mLs by mouth 4 (four) times daily as needed.   TRUE METRIX BLOOD GLUCOSE TEST test strip TEST BLOOD SUGAR EVERY DAY   TRUEplus Lancets 33G MISC TEST ONE TO TWO TIMES DAILY AS NEEDED DUE TO FLUCTUATING GLUCOSE   [DISCONTINUED] HYDROcodone-acetaminophen (NORCO) 7.5-325 MG tablet 1 q4 hours prn   No facility-administered encounter medications on file as of 02/12/2023.    Results for orders placed or performed in visit on 11/15/22  Hemoglobin A1c  Result Value Ref Range   Hgb A1c MFr Bld 7.8 (H) 4.8 - 5.6 %   Est. average glucose Bld gHb Est-mCnc 177 mg/dL  Basic metabolic panel  Result Value Ref Range   Glucose 179 (H) 70 - 99 mg/dL   BUN 11 8 - 27 mg/dL   Creatinine, Ser 3.08 0.57 - 1.00 mg/dL   eGFR 75 >65 HQ/ION/6.29   BUN/Creatinine Ratio 13 12 - 28   Sodium 138 134 - 144 mmol/L   Potassium 4.4 3.5 - 5.2 mmol/L   Chloride 98 96 - 106 mmol/L   CO2 22 20 - 29 mmol/L   Calcium 9.6 8.7 - 10.3 mg/dL  Lipid panel  Result Value Ref Range   Cholesterol, Total 275 (H) 100 - 199 mg/dL   Triglycerides 528 (H) 0 -  149 mg/dL   HDL 35 (L) >16 mg/dL   VLDL Cholesterol Cal 55 (H) 5 - 40 mg/dL   LDL Chol Calc (NIH) 109 (H) 0 - 99 mg/dL   Chol/HDL Ratio 7.9 (H) 0.0 - 4.4 ratio  Hepatic function panel  Result Value Ref Range   Total Protein 7.5 6.0 - 8.5 g/dL   Albumin 4.7 3.8 - 4.8 g/dL   Bilirubin Total 0.8 0.0 - 1.2 mg/dL   Bilirubin, Direct 6.04 0.00 - 0.40 mg/dL   Alkaline Phosphatase 71 44 - 121 IU/L   AST 103 (H) 0 - 40 IU/L   ALT 88 (H) 0 - 32 IU/L  TSH  Result Value Ref Range   TSH 2.120 0.450 - 4.500 uIU/mL    Primary hypertension  [name] (nonalcoholic steatohepatitis)  Hyperlipidemia associated with type 2 diabetes mellitus (HCC)  Hypothyroidism, unspecified type  Type 2 diabetes mellitus without complication, without  long-term current use of insulin (HCC) - Plan: Hemoglobin A1c, Basic Metabolic Panel, Microalbumin/Creatinine Ratio, Urine  Peripheral polyneuropathy  Labs were reviewed in detail She relates compliance with medicine Trying to eat healthy Staying away from smoking Follows with specialist on a regular basis Consumes more sugary drinks than she should but at the same time not excessive She is concerned about going into the donut hole with higher cost medicines   Review of Systems     Objective:   Physical Exam General-in no acute distress Eyes-no discharge Lungs-respiratory rate normal, CTA CV-no murmurs,RRR Extremities skin warm dry no edema Neuro grossly normal Behavior normal, alert Subjective discomfort left shoulder around the joint and into the upper part of the arm slight decreased range of motion  She describes left shoulder pain from the shoulder into the humerus area and into the neck to some degree hurts with certain movements.  She would like to try conservative approaches first and if not getting better with that referral to Ortho it is possible and could be impingement of the cervical spine nerve versus shoulder arthritis-would need further testing     Assessment & Plan:  1. Primary hypertension HTN- patient seen for follow-up regarding HTN.   Diet, medication compliance, appropriate labs and refills were completed.   Importance of keeping blood pressure under good control to lessen the risk of complications discussed Regular follow-up visits discussed   2. [name] (nonalcoholic steatohepatitis) Healthy diet regular physical activity get A1c under better control limiting sugars from the diet follow-up with gastroenterology every 6 months  3. Hyperlipidemia associated with type 2 diabetes mellitus (HCC) Patient cannot tolerate statins, due to [name] cannot be on statins, also had myalgias with statins Healthy diet weight loss recommended We did discuss Repatha  patient concerned about cost Will reach out to clinical pharmacy 4. Hypothyroidism, unspecified type Under good control continue current medication  5. Type 2 diabetes mellitus without complication, without long-term current use of insulin (HCC) 7.8 Goal is to be 7.0 or less Continue current medications Patient will cut back on sugars If not doing better by the time she does follow-up visit in 4 months we will be adding additional medicine She is concerned about the cost - Hemoglobin A1c - Basic Metabolic Panel - Microalbumin/Creatinine Ratio, Urine  6. Peripheral polyneuropathy Gabapentin slowly titrate up side effects discussed of side effects stop medication  Follow-up in 4 months  Shoulder pain try anti-inflammatory over the next 2 weeks if that does not take care of it referral to Ortho could be rotator cuff could be  tendinitis could be nerve impingement in the neck

## 2023-02-12 NOTE — Progress Notes (Unsigned)
   Subjective:    Patient ID: Nicole Lam, female    DOB: 1951-06-04, 72 y.o.   MRN: [id number]  HPI    Review of Systems     Objective:   Physical Exam        Assessment & Plan:

## 2023-02-26 ENCOUNTER — Telehealth: Payer: Self-pay | Admitting: Gastroenterology

## 2023-02-26 NOTE — Telephone Encounter (Signed)
Reviewed labs completed with PCP 02/07/2023.  LFTs remain elevated, but fairly stable.  Cholesterol is elevated.  She is being followed by her PCP for hyperlipidemia.  No statins in the setting of myalgia and already elevated LFTs.  They are considering Repatha.  No further recommendations at this time.  Will follow-up as planned around September 2024.

## 2023-03-22 ENCOUNTER — Telehealth: Payer: Self-pay

## 2023-03-22 ENCOUNTER — Other Ambulatory Visit: Payer: Self-pay

## 2023-03-22 NOTE — Telephone Encounter (Signed)
May stop Naprosyn May remove this from her med list As for gabapentin may take 2 of these in the evening time If any ongoing troubles let us know

## 2023-03-22 NOTE — Telephone Encounter (Signed)
Pt is calling to let Dr Lorin Picket naproxen (NAPROSYN) 500 MG tablet is making her stomach hurt and she can not take that medication. On the gabapentin (NEURONTIN) 100 MG capsule she is only taking 1 at night because she does not feel good during the day she is wanting to know if she can take two at night since she can not take during the day time?  Nicole [phone]

## 2023-03-22 NOTE — Telephone Encounter (Signed)
Pt is calling to let Dr Lorin Picket naproxen (NAPROSYN) 500 MG tablet is making her stomach hurt and she can not take that medication. On the gabapentin (NEURONTIN) 100 MG capsule she is only taking 1 at night because she does not feel good during the day she is wanting to know if she can take two at night since she can not take during the day time?

## 2023-03-22 NOTE — Telephone Encounter (Signed)
Spoke with patient and informed per provider recommendations for medications, gabapentin and naproxen- removed from medication list.

## 2023-03-27 DIAGNOSIS — M545 Low back pain, unspecified: Secondary | ICD-10-CM | POA: Diagnosis not present

## 2023-03-27 DIAGNOSIS — S39012A Strain of muscle, fascia and tendon of lower back, initial encounter: Secondary | ICD-10-CM | POA: Diagnosis not present

## 2023-04-04 ENCOUNTER — Other Ambulatory Visit: Payer: Self-pay | Admitting: Family Medicine

## 2023-04-11 ENCOUNTER — Other Ambulatory Visit: Payer: Self-pay | Admitting: Family Medicine

## 2023-04-12 NOTE — Telephone Encounter (Signed)
Med refill for     HYDROcodone-acetaminophen (NORCO) 7.5-325 MG tablet   [facility]   Verdia Kuba [phone]

## 2023-04-29 ENCOUNTER — Other Ambulatory Visit: Payer: Self-pay

## 2023-04-29 MED ORDER — HYDROCODONE-ACETAMINOPHEN 7.5-325 MG PO TABS: ORAL_TABLET | ORAL | 0 refills | Status: DC

## 2023-04-29 NOTE — Telephone Encounter (Signed)
Prescription Request  04/29/2023  LOV: Visit date not found  What is the name of the medication or equipment? HYDROcodone-acetaminophen (NORCO) 7.5-325 MG tablet   Have you contacted your pharmacy to request a refill? Yes   Which pharmacy would you like this sent to? [facility]   Patient notified that their request is being sent to the clinical staff for review and that they should receive a response within 2 business days.   Please advise at Mobile [phone] (mobile)

## 2023-05-07 ENCOUNTER — Telehealth: Payer: Self-pay

## 2023-05-07 NOTE — Telephone Encounter (Signed)
Pt is having pain in butt cheek hurts when she lays down or stands and walk huts in right hip all way to foot. Pt went to massage therapy she is being told she needs a referral to orthopedic Dr can this referral be placed.  Jose [phone]

## 2023-05-07 NOTE — Telephone Encounter (Signed)
Spoke with patient she states would like to see Dr Lorin Picket for eval, appt scheduled

## 2023-05-07 NOTE — Telephone Encounter (Signed)
What she has going on sounds like sciatica which is an impingement of the nerve in the back.  The majority of these will get better over time Unfortunately currently I do not have any openings this week We can see her next week Or if she does want to see orthopedics we can go ahead with referral to orthopedics but would have to maneuver with him that her insurance thank you

## 2023-05-07 NOTE — Telephone Encounter (Signed)
Pt is having pain in butt cheek hurts when she lays down or stands and walk huts in right hip all way to foot. Pt went to massage therapy she is being told she needs a referral to orthopedic Dr can this referral be placed.

## 2023-05-09 ENCOUNTER — Other Ambulatory Visit: Payer: Self-pay | Admitting: Family Medicine

## 2023-05-13 ENCOUNTER — Encounter: Payer: Self-pay | Admitting: Family Medicine

## 2023-05-13 ENCOUNTER — Ambulatory Visit (INDEPENDENT_AMBULATORY_CARE_PROVIDER_SITE_OTHER): Payer: Medicare HMO | Admitting: Family Medicine

## 2023-05-13 VITALS — BP 151/71 | HR 93 | Temp 98.0°F | Ht <= 58 in | Wt 127.0 lb

## 2023-05-13 DIAGNOSIS — M5431 Sciatica, right side: Secondary | ICD-10-CM

## 2023-05-13 NOTE — Progress Notes (Signed)
   Subjective:    Patient ID: Nicole Lam, female    DOB: April 24, 1951, 72 y.o.   MRN: [id number]  HPI  Low back, R buttock, right leg and r foot pain x 3 weeks  Pain is worse when sitting and laying down at night  No known injury Relates a lot of pain in the leg but no weakness  Review of Systems     Objective:   Physical Exam Sciatica right leg positive straight leg raise left side negative Lungs clear heart regular No strength issues       Assessment & Plan:  Sciatica Stretches shown Increase gabapentin to each evening Hold off on Cymbalta If progressive symptoms or worse follow-up sooner No need for MRI currently Has a regular follow-up in September

## 2023-05-19 ENCOUNTER — Encounter: Payer: Self-pay | Admitting: Family Medicine

## 2023-05-19 DIAGNOSIS — G720 Drug-induced myopathy: Secondary | ICD-10-CM | POA: Insufficient documentation

## 2023-05-29 ENCOUNTER — Other Ambulatory Visit: Payer: Self-pay | Admitting: Family Medicine

## 2023-05-30 ENCOUNTER — Encounter: Payer: Self-pay | Admitting: Gastroenterology

## 2023-06-19 ENCOUNTER — Ambulatory Visit: Payer: Medicare HMO | Admitting: Family Medicine

## 2023-06-20 DIAGNOSIS — E119 Type 2 diabetes mellitus without complications: Secondary | ICD-10-CM | POA: Diagnosis not present

## 2023-07-03 ENCOUNTER — Ambulatory Visit: Payer: Medicare HMO | Admitting: Family Medicine

## 2023-07-03 VITALS — BP 122/72 | HR 72 | Temp 98.1°F | Wt 124.4 lb

## 2023-07-03 DIAGNOSIS — E119 Type 2 diabetes mellitus without complications: Secondary | ICD-10-CM

## 2023-07-03 DIAGNOSIS — Z789 Other specified health status: Secondary | ICD-10-CM

## 2023-07-03 DIAGNOSIS — I10 Essential (primary) hypertension: Secondary | ICD-10-CM | POA: Diagnosis not present

## 2023-07-03 DIAGNOSIS — K7581 Nonalcoholic steatohepatitis (NASH): Secondary | ICD-10-CM

## 2023-07-03 DIAGNOSIS — M542 Cervicalgia: Secondary | ICD-10-CM | POA: Diagnosis not present

## 2023-07-03 DIAGNOSIS — E1169 Type 2 diabetes mellitus with other specified complication: Secondary | ICD-10-CM

## 2023-07-03 DIAGNOSIS — E039 Hypothyroidism, unspecified: Secondary | ICD-10-CM

## 2023-07-03 DIAGNOSIS — Z23 Encounter for immunization: Secondary | ICD-10-CM | POA: Diagnosis not present

## 2023-07-03 DIAGNOSIS — E785 Hyperlipidemia, unspecified: Secondary | ICD-10-CM

## 2023-07-03 DIAGNOSIS — Z7984 Long term (current) use of oral hypoglycemic drugs: Secondary | ICD-10-CM

## 2023-07-03 NOTE — Addendum Note (Signed)
Addended by: Lilyan Punt A on: 07/03/2023 05:56 PM   Modules accepted: Orders

## 2023-07-03 NOTE — Progress Notes (Signed)
Subjective:    Patient ID: Nicole Lam, female    DOB: 01-27-1951, 72 y.o.   MRN: [id number]  HPI Patient arrives 4 month follow up. Patient states she has had a life line screening and she has results.  Needs flu shot - Plan: Flu Vaccine Trivalent High Dose (Fluad)  Primary hypertension  [name] (nonalcoholic steatohepatitis)  Type 2 diabetes mellitus without complication, without long-term current use of insulin (HCC)  Hypothyroidism, unspecified type  Hyperlipidemia associated with type 2 diabetes mellitus (HCC)  Statin intolerance  Cervical pain  Patient for blood pressure check up.  The patient does have hypertension.   Patient relates dietary measures try to minimize salt The importance of healthy diet and activity were discussed Patient relates compliance  The patient was seen today as part of a comprehensive diabetic check up. Patient has diabetes Patient relates good compliance with taking the medication. We discussed their diet and exercise activities  We also discussed the importance of notifying us if any excessively high glucoses or low sugars.   She is trying to eat healthy trying to stay active but A1c much higher than what we like to see Cannot afford high cost of medications well Patient ideally would not like to be on insulin unless necessary  Results for orders placed or performed in visit on 02/12/23  Hemoglobin A1c  Result Value Ref Range   Hgb A1c MFr Bld 7.8 (H) 4.8 - 5.6 %   Est. average glucose Bld gHb Est-mCnc 177 mg/dL  Basic Metabolic Panel  Result Value Ref Range   Glucose 149 (H) 70 - 99 mg/dL   BUN 13 8 - 27 mg/dL   Creatinine, Ser 3.08 0.57 - 1.00 mg/dL   eGFR 61 >65 HQ/ION/6.29   BUN/Creatinine Ratio 13 12 - 28   Sodium 139 134 - 144 mmol/L   Potassium 4.7 3.5 - 5.2 mmol/L   Chloride 100 96 - 106 mmol/L   CO2 25 20 - 29 mmol/L   Calcium 10.3 8.7 - 10.3 mg/dL  Microalbumin/Creatinine Ratio, Urine  Result Value Ref Range    Creatinine, Urine 20.7 Not Estab. mg/dL   Microalbumin, Urine <5.2 Not Estab. ug/mL   Microalb/Creat Ratio <14 0 - 29 mg/g creat     Review of Systems     Objective:   Physical Exam General-in no acute distress Eyes-no discharge Lungs-respiratory rate normal, CTA CV-no murmurs,RRR Extremities skin warm dry no edema Neuro grossly normal Behavior normal, alert        Assessment & Plan:  1. Needs flu shot Flu shot - Flu Vaccine Trivalent High Dose (Fluad)  2. Primary hypertension HTN- patient seen for follow-up regarding HTN.   Diet, medication compliance, appropriate labs and refills were completed.   Importance of keeping blood pressure under good control to lessen the risk of complications discussed Regular follow-up visits discussed   3. [name] (nonalcoholic steatohepatitis) Healthy diet regular activity try to keep weight down   4. Type 2 diabetes mellitus without complication, without long-term current use of insulin (HCC) Patient will try to get her A1c down by improving her diet along with glipizide 2 tablets twice daily, metformin 2 tablets twice daily, if her A1c stays elevated we will consider the possibility of a low-dose GLP-1 which could help her diabetes as well as help her fatty liver disease but would get the clinical pharmacist involved on that decision  5. Hypothyroidism, unspecified type Continue medication  6. Hyperlipidemia associated with type 2 diabetes mellitus (  HCC) Labs not indicated currently continue Zetia cannot tolerate statins  7. Statin intolerance Unable to tolerate statins  8. Cervical pain Mid back pain cervical pain patient requested a prescription for therapeutic massage once a month we gave her prescription for this  Handicap placard filled out as well I have encouraged patient to get eye exam she states she is eligible to do so in January Follow-up in approximately 4 to 6 months with lab work before that visit

## 2023-07-03 NOTE — Patient Instructions (Signed)

## 2023-07-09 ENCOUNTER — Other Ambulatory Visit (HOSPITAL_COMMUNITY): Payer: Self-pay | Admitting: Family Medicine

## 2023-07-09 DIAGNOSIS — Z1231 Encounter for screening mammogram for malignant neoplasm of breast: Secondary | ICD-10-CM

## 2023-07-10 NOTE — Progress Notes (Signed)
Referring Provider: Babs Sciara, MD Primary Care Physician:  Babs Sciara, MD Primary GI Physician: Dr. Jena Gauss  Chief Complaint  Patient presents with   Follow-up    Follow up. No problems     HPI:   Nicole Lam is a 72 y.o. female presenting today for follow-up.  She has history of IDA, dysphagia with Schatzki's ring s/p dilation June 2021 and maintained on daily PPI, constipation, suspected MASH with mildly elevated LFTs and metavir F2/F3 in 2017, undergoing yearly [place].  RUQ ultrasound March 2023 with elastography with increased hepatic parenchymal echogenicity, median K PA 5.6. Extensive serologies in the past including hepatitis B, hepatitis C, smooth muscle antibody, mitochondrial antibody, ANA, immunoglobulins, ceruloplasmin, TTG IgA all negative.   EGD and colonoscopy June 2021 for IDA with 1 tubular adenoma, small Schatzki's ring s/p dilation, small hiatal hernia, otherwise normal exam. Patient unable to swallow Givens capsule.  Offered endoscopic placement patency capsule followed by endoscopic placement of Givens capsule if appropriate, but patient declined.  Also previously offered CT enterography, but patient also declined due to contrast causing nausea.  She was treated with oral iron with normalization of hemoglobin and iron panel in March 2022.    Last seen in the office 12/20/2022.  No signs or symptoms of decompensated liver disease.  LFTs remain elevated, but stable in November.  Platelets remain within normal limits.  She had lost a few pounds with eliminating fast food.  Hemoglobin A1c was improved to 7.4.  No overt GI bleeding.  She was not taking iron.  MiraLAX had been controlling constipation well, but some mild breakthrough over the last few days.  No significant dysphagia, maintaining on pantoprazole daily. Plan for RUQ ultrasound with elastography, keep appointment with PCP for labs in April, recommended yearly monitoring of hemoglobin, increase MiraLAX to twice  daily and add stool softeners temporarily until bowels start moving well, follow-up in 6 months.  Ultrasound elastography 12/24/2022 with hepatic steatosis, median K PA 6.3.  Labs completed 02/07/2023 with LFTs fairly stable.  AST 103, ALT 88.  Today: MASLD:  Denies abdominal distention, lower extremity edema, yellowing of the eyes or skin, bruising, bleeding, mental status changes/confusion.  She has lost a few pounds since I saw her last.  She does not eat out anymore.  States that she cannot afford it.  Also not snacking.  Wt Readings from Last 6 Encounters:  07/11/23 123 lb 6.4 oz (56 kg)  07/03/23 124 lb 6.4 oz (56.4 kg)  05/13/23 127 lb (57.6 kg)  02/12/23 130 lb 6.4 oz (59.1 kg)  12/20/22 126 lb (57.2 kg)  09/24/22 129 lb (58.5 kg)   A1C 7.8 in April 2024   IDA:  Denies BRBPR or melena. Needs labs updated.  Constipation:  Bowels moving well with MiraLAX daily.    Continues to take pantoprazole daily due to history of Schatzki's ring.  No recurrent dysphagia, heartburn, nausea, vomiting.    Past Medical History:  Diagnosis Date   Diabetes (HCC)    Fatty liver    with mild LFT elevation, F2/F3 2017   Hemorrhoid    Hyperlipidemia    Hypertension    Hypothyroidism    Lipoma of neck     Past Surgical History:  Procedure Laterality Date   ABDOMINAL HYSTERECTOMY     AGILE CAPSULE N/A 06/14/2020   Procedure: AGILE CAPSULE;  Surgeon: Corbin Ade, MD; unable to swallow agile   APPENDECTOMY     CATARACT EXTRACTION  Bilateral 11/01/2022   right eye , feb 2024 left eye   COLONOSCOPY  05/30/2005   [id number] anal papilla, otherwise normal rectum.and colon   COLONOSCOPY N/A 11/04/2015   RMR: normal colonoscopy   COLONOSCOPY WITH PROPOFOL N/A 04/07/2020   Procedure: COLONOSCOPY WITH PROPOFOL;  Surgeon: Corbin Ade, MD; One 4 mm polyp at ileocecal valve, otherwise normal exam.  Patient was heme-negative.  Pathology with tubular adenoma.  No recommendations to  repeat colonoscopy.   DILATION AND CURETTAGE OF UTERUS     ESOPHAGEAL DILATION  04/07/2020   Procedure: ESOPHAGEAL DILATION;  Surgeon: Corbin Ade, MD;  [facility]: [facility];  Service: Endoscopy;;   ESOPHAGOGASTRODUODENOSCOPY (EGD) WITH PROPOFOL N/A 04/07/2020   Procedure: ESOPHAGOGASTRODUODENOSCOPY (EGD) WITH PROPOFOL;  Surgeon: Corbin Ade, MD;  Small Schatzki's ring s/p dilated, small hiatal hernia, otherwise normal exam.   POLYPECTOMY  04/07/2020   Procedure: POLYPECTOMY;  Surgeon: Corbin Ade, MD;  Location: [facility];  Service: Endoscopy;;    Current Outpatient Medications  Medication Sig Dispense Refill   Blood Glucose Monitoring Suppl (TRUE METRIX METER) w/Device KIT USE AS DIRECTED 1 kit 0   diclofenac sodium (VOLTAREN) 1 % GEL Apply 4 g topically 4 (four) times daily. (Patient taking differently: Apply 4 g topically 2 (two) times daily as needed (pain).) 5 Tube 5   estradiol (ESTRACE) 0.5 MG tablet TAKE 1 TABLET EVERY DAY 90 tablet 10   ezetimibe (ZETIA) 10 MG tablet TAKE 1 TABLET EVERY DAY 90 tablet 3   gabapentin (NEURONTIN) 100 MG capsule Take 1 capsule (100 mg total) by mouth 3 (three) times daily. (Patient taking differently: Take 100 mg by mouth 3 (three) times daily. At night only) 90 capsule 3   glipiZIDE (GLUCOTROL XL) 5 MG 24 hr tablet TAKE 1 TABLET EVERY MORNING AND TAKE 2 TABLETS EVERY EVENING 270 tablet 3   HYDROcodone-acetaminophen (NORCO) 7.5-325 MG tablet 1 q4 hours prn (Patient taking differently: 1/2 tab as needed) 30 tablet 0   ibuprofen (ADVIL) 400 MG tablet Take 400 mg by mouth every 6 (six) hours as needed. As needed for pain     levothyroxine (SYNTHROID) 88 MCG tablet TAKE 1/2 TABLET ON MONDAY AND FRIDAYS AND TAKE 1 WHOLE TABLET ALL OTHER DAYS 78 tablet 3   losartan (COZAAR) 50 MG tablet TAKE 1 TABLET EVERY DAY 90 tablet 3   metFORMIN (GLUCOPHAGE) 500 MG tablet TAKE 2 TABLETS TWICE DAILY 360 tablet 3   metoprolol tartrate (LOPRESSOR) 100 MG  tablet Take 1 tablet (100 mg total) by mouth once for 1 dose. 1 tablet 0   Multiple Vitamins-Minerals (MULTIVITAMIN WITH MINERALS) tablet Take 1 tablet by mouth daily.     pantoprazole (PROTONIX) 40 MG tablet TAKE 1 TABLET EVERY DAY 90 tablet 3   phenylephrine-shark liver oil-mineral oil-petrolatum (PREPARATION H) 0.25-3-14-71.9 % rectal ointment Place 1 application rectally as needed for hemorrhoids.     polyethylene glycol (MIRALAX / GLYCOLAX) packet Take 17 g by mouth daily.      TRUE METRIX BLOOD GLUCOSE TEST test strip TEST BLOOD SUGAR EVERY DAY 100 strip 0   TRUEplus Lancets 33G MISC TEST ONE TO TWO TIMES DAILY AS NEEDED DUE TO FLUCTUATING GLUCOSE 200 each 0   nitroGLYCERIN (NITROSTAT) 0.4 MG SL tablet Place 1 tablet (0.4 mg total) under the tongue every 5 (five) minutes x 3 doses as needed for chest pain. (Patient not taking: Reported on 07/11/2023) 90 tablet 3   No current facility-administered medications for this  visit.    Allergies as of 07/11/2023 - Review Complete 07/11/2023  Allergen Reaction Noted   Atorvastatin  11/11/2021    Family History  Problem Relation Age of Onset   Heart disease Mother    Hypertension Mother    Birth defects Daughter    Colon cancer Neg Hx    Liver disease Neg Hx    Colon polyps Neg Hx     Social History   Socioeconomic History   Marital status: Married    Spouse name: Therapist, nutritional   Number of children: 4   Years of education: Not on file   Highest education level: Not on file  Occupational History   Not on file  Tobacco Use   Smoking status: Never   Smokeless tobacco: Never  Substance and Sexual Activity   Alcohol use: No    Alcohol/week: 0.0 standard drinks of alcohol   Drug use: No   Sexual activity: Yes    Birth control/protection: Surgical    Comment: hyst  Other Topics Concern   Not on file  Social History Narrative   Total of 4 kids between pt and husband. 3 living.    Married x 28 years.    Social Determinants of Health    Financial Resource Strain: Low Risk  (07/10/2022)   Overall Financial Resource Strain (CARDIA)    Difficulty of Paying Living Expenses: Not hard at all  Food Insecurity: No Food Insecurity (07/10/2022)   Hunger Vital Sign    Worried About Running Out of Food in the Last Year: Never true    Ran Out of Food in the Last Year: Never true  Transportation Needs: No Transportation Needs (07/10/2022)   PRAPARE - Administrator, Civil Service (Medical): No    Lack of Transportation (Non-Medical): No  Physical Activity: Insufficiently Active (07/10/2022)   Exercise Vital Sign    Days of Exercise per Week: 3 days    Minutes of Exercise per Session: 10 min  Stress: No Stress Concern Present (07/10/2022)   [employer] of Occupational Health - Occupational Stress Questionnaire    Feeling of Stress : Only a little  Social Connections: Moderately Integrated (07/10/2022)   Social Connection and Isolation Panel [NHANES]    Frequency of Communication with Friends and Family: Once a week    Frequency of Social Gatherings with Friends and Family: Three times a week    Attends Religious Services: More than 4 times per year    Active Member of Clubs or Organizations: No    Attends Banker Meetings: Never    Marital Status: Married    Review of Systems: Gen: Denies fever, chills, cold or flulike symptoms, presyncope, syncope. CV: Denies chest pain, palpitations.  Resp: Denies dyspnea, cough. GI: See HPI  Heme: See HPI  Physical Exam: BP 130/70 (BP Location: Right Arm, Patient Position: Sitting, Cuff Size: Normal)   Pulse 92   Temp 98.4 F (36.9 C) (Temporal)   Ht 4\' 10"  (1.473 m)   Wt 123 lb 6.4 oz (56 kg)   SpO2 96%   BMI 25.79 kg/m  General:   Alert and oriented. No distress noted. Pleasant and cooperative.  Head:  Normocephalic and atraumatic. Eyes:  Conjuctiva clear without scleral icterus. Heart:  S1, S2 present without murmurs appreciated. Lungs:  Clear to  auscultation bilaterally. No wheezes, rales, or rhonchi. No distress.  Abdomen:  +BS, soft, non-tender and non-distended. No rebound or guarding. No HSM or masses noted. Msk:  Symmetrical  without gross deformities. Normal posture. Extremities:  Without edema. Neurologic:  Alert and  oriented x4 Psych:  Normal mood and affect.    Assessment:  72 year old female with history of IDA, dysphagia with Schatzki's ring s/p dilation June 2021 and maintained on daily PPI, constipation, suspected MASLD with mildly elevated LFTs and Metavir F2/F3 in 2017 undergoing yearly ultrasound, presenting today for routine follow-up.  IDA: S/p EGD and colonoscopy June 2021 with 1 tubular adenoma, small Schatzki's ring s/p dilation, small hiatal hernia, otherwise normal exams. Patient unable to swallow Givens capsule.  Offered endoscopic placement patency capsule followed by endoscopic placement of Givens capsule if appropriate, but patient declined.  Also previously offered CT enterography, but patient also declined due to contrast causing nausea.  She was treated with oral iron. Hemoglobin returned to normal in March 2022 with iron panel within normal limits.  Iron was discontinued at that time.  And hemoglobin has remained within normal limits.  She is due for updated blood work.  Last hemoglobin 12.8 in November 2023.   MASH:  Extensive serologies previously including hepatitis A, B, C all negative,smooth muscle antibody, mitochondrial antibody, ANA, immunoglobulins, ceruloplasmin, TTG IgA all negative. No alcohol, significant tylenol, or herbal supplements. Previously with F2/F3 fibrosis in 2017.  Most recent ultrasound with elastography March 2024 with hepatic steatosis, median kPa 6.3. LFTs remain elevated. Most recent labs 02/07/23 with AST 103, ALT 88. Clinically doing well without signs or symptoms of decompensated liver disease.  She had continued to lose weight slowly since eliminating fast foods and snacking.   Most recent Hemoglobin A1c elevated at 7.8.   I will plan to update LFTs, and check [name] Fibrosure and ELF score to see if patient is a candidate for Rezdiffra.    Constipation: Doing well on MiraLAX daily.  History of Schatzki's ring: Maintained on daily PPI.  No recurrent dysphagia symptoms.   Plan:  CBC, HFP, [name] FibroSure, ELF score.   Consider Rezdiffra pending lab results.  Instructions for fatty liver: Low fat/cholesterol diet.   Avoid sweets, sodas, fruit juices, sweetened beverages like tea, etc. Goal of 30 minutes of exercise daily.  Continue MiraLAX 17 g daily. Continue pantoprazole 40 mg daily. Follow-up in 6 months or sooner if needed.   Ermalinda Memos, PA-C [facility] 07/11/2023

## 2023-07-11 ENCOUNTER — Encounter: Payer: Self-pay | Admitting: Gastroenterology

## 2023-07-11 ENCOUNTER — Ambulatory Visit: Payer: Medicare HMO | Admitting: Gastroenterology

## 2023-07-11 VITALS — BP 130/70 | HR 92 | Temp 98.4°F | Ht <= 58 in | Wt 123.4 lb

## 2023-07-11 DIAGNOSIS — K760 Fatty (change of) liver, not elsewhere classified: Secondary | ICD-10-CM | POA: Insufficient documentation

## 2023-07-11 DIAGNOSIS — D509 Iron deficiency anemia, unspecified: Secondary | ICD-10-CM

## 2023-07-11 DIAGNOSIS — K7581 Nonalcoholic steatohepatitis (NASH): Secondary | ICD-10-CM | POA: Diagnosis not present

## 2023-07-11 DIAGNOSIS — K5900 Constipation, unspecified: Secondary | ICD-10-CM | POA: Diagnosis not present

## 2023-07-11 NOTE — Patient Instructions (Addendum)
Please have blood work completed at [facility].  Continue pantoprazole 40 mg daily.  Continue MiraLAX daily.  Instructions for fatty liver: Low fat/cholesterol diet.   Avoid sweets, sodas, fruit juices, sweetened beverages like tea, etc. Goal of 30 minutes of exercise daily.   I will reach out to you with your blood work results and any further recommendations.  We will plan to follow-up with you in 6 months or sooner if needed.  Ermalinda Memos, PA-C [facility]

## 2023-07-13 ENCOUNTER — Encounter: Payer: Self-pay | Admitting: Gastroenterology

## 2023-07-15 DIAGNOSIS — K760 Fatty (change of) liver, not elsewhere classified: Secondary | ICD-10-CM | POA: Diagnosis not present

## 2023-07-16 LAB — NASH FIBROSURE(R) PLUS

## 2023-07-17 LAB — CBC WITH DIFFERENTIAL/PLATELET
Basophils Absolute: 0.1 10*3/uL (ref 0.0–0.2)
Basos: 1 %
EOS (ABSOLUTE): 0.3 10*3/uL (ref 0.0–0.4)
Eos: 5 %
Hematocrit: 37.1 % (ref 34.0–46.6)
Hemoglobin: 12 g/dL (ref 11.1–15.9)
Immature Grans (Abs): 0 10*3/uL (ref 0.0–0.1)
Immature Granulocytes: 1 %
Lymphocytes Absolute: 2.2 10*3/uL (ref 0.7–3.1)
Lymphs: 37 %
MCH: 29.1 pg (ref 26.6–33.0)
MCHC: 32.3 g/dL (ref 31.5–35.7)
MCV: 90 fL (ref 79–97)
Monocytes Absolute: 0.4 10*3/uL (ref 0.1–0.9)
Monocytes: 6 %
Neutrophils Absolute: 3 10*3/uL (ref 1.4–7.0)
Neutrophils: 50 %
Platelets: 283 10*3/uL (ref 150–450)
RBC: 4.12 x10E6/uL (ref 3.77–5.28)
RDW: 14.2 % (ref 11.7–15.4)
WBC: 6 10*3/uL (ref 3.4–10.8)

## 2023-07-17 LAB — HEPATIC FUNCTION PANEL
ALT: 45 [IU]/L — ABNORMAL HIGH (ref 0–32)
AST: 39 [IU]/L (ref 0–40)
Albumin: 4.4 g/dL (ref 3.8–4.8)
Alkaline Phosphatase: 58 [IU]/L (ref 44–121)
Bilirubin Total: 0.6 mg/dL (ref 0.0–1.2)
Bilirubin, Direct: 0.14 mg/dL (ref 0.00–0.40)
Total Protein: 7 g/dL (ref 6.0–8.5)

## 2023-07-17 LAB — NASH FIBROSURE(R) PLUS
ALPHA 2-MACROGLOBULINS, QN: 333 mg/dL — ABNORMAL HIGH (ref 110–276)
ALT (SGPT) P5P: 56 IU/L — ABNORMAL HIGH (ref 0–40)
AST (SGOT) P5P: 42 IU/L — ABNORMAL HIGH (ref 0–40)
Apolipoprotein A-1: 112 mg/dL — ABNORMAL LOW (ref 116–209)
Bilirubin, Total: 0.4 mg/dL (ref 0.0–1.2)
Cholesterol, Total: 238 mg/dL — ABNORMAL HIGH (ref 100–199)
Fibrosis Score: 0.52 — ABNORMAL HIGH (ref 0.00–0.21)
GGT: 25 IU/L (ref 0–60)
Glucose: 137 mg/dL — ABNORMAL HIGH (ref 70–99)
Haptoglobin: 170 mg/dL (ref 42–346)
NASH Score: 0.77 — ABNORMAL HIGH (ref 0.00–0.25)
Steatosis Score: 0.81 — ABNORMAL HIGH (ref 0.00–0.40)
Triglycerides: 229 mg/dL — ABNORMAL HIGH (ref 0–149)

## 2023-07-17 LAB — ENHANCED LIVER FIBROSIS (ELF): ELF(TM) Score: 10.11 — ABNORMAL HIGH (ref <9.80)

## 2023-07-19 ENCOUNTER — Ambulatory Visit (INDEPENDENT_AMBULATORY_CARE_PROVIDER_SITE_OTHER): Payer: Medicare HMO

## 2023-07-19 VITALS — Ht <= 58 in | Wt 124.0 lb

## 2023-07-19 DIAGNOSIS — Z0000 Encounter for general adult medical examination without abnormal findings: Secondary | ICD-10-CM

## 2023-07-19 NOTE — Progress Notes (Signed)
Subjective:   Nicole Lam is a 72 y.o. female who presents for Medicare Annual (Subsequent) preventive examination.  Visit Complete: Virtual  I connected with  Nicole Lam on 07/19/23 by a audio enabled telemedicine application and verified that I am speaking with the correct person using two identifiers.  Patient Location: Home  Provider Location: Home Office  I discussed the limitations of evaluation and management by telemedicine. The patient expressed understanding and agreed to proceed.  Patient Medicare AWV questionnaire was completed by the patient on 07/19/2023; I have confirmed that all information answered by patient is correct and no changes since this date.  Cardiac Risk Factors include: advanced age (>64men, >71 women);diabetes mellitus;dyslipidemia;hypertensionBecause this visit was a virtual/telehealth visit, some criteria may be missing or patient reported. Any vitals not documented were not able to be obtained and vitals that have been documented are patient reported.       Objective:    Today's Vitals   07/19/23 1010  Weight: 124 lb (56.2 kg)  Height: 4\' 10"  (1.473 m)   Body mass index is 25.92 kg/m.     07/19/2023   10:13 AM 07/10/2022    3:08 PM 07/04/2021    3:59 PM 04/07/2020    8:37 AM 04/04/2020    1:08 PM 07/15/2017    8:39 AM 05/28/2016    9:14 AM  Advanced Directives  Does Patient Have a Medical Advance Directive? No No No No No No No  Would patient like information on creating a medical advance directive? Yes (MAU/Ambulatory/Procedural Areas - Information given) No - Patient declined No - Patient declined No - Patient declined No - Patient declined No - Patient declined No - patient declined information    Current Medications (verified) Outpatient Encounter Medications as of 07/19/2023  Medication Sig   Blood Glucose Monitoring Suppl (TRUE METRIX METER) w/Device KIT USE AS DIRECTED   diclofenac sodium (VOLTAREN) 1 % GEL Apply 4 g topically  4 (four) times daily. (Patient taking differently: Apply 4 g topically 2 (two) times daily as needed (pain).)   estradiol (ESTRACE) 0.5 MG tablet TAKE 1 TABLET EVERY DAY   ezetimibe (ZETIA) 10 MG tablet TAKE 1 TABLET EVERY DAY   gabapentin (NEURONTIN) 100 MG capsule Take 1 capsule (100 mg total) by mouth 3 (three) times daily. (Patient taking differently: Take 100 mg by mouth 3 (three) times daily. At night only)   glipiZIDE (GLUCOTROL XL) 5 MG 24 hr tablet TAKE 1 TABLET EVERY MORNING AND TAKE 2 TABLETS EVERY EVENING   HYDROcodone-acetaminophen (NORCO) 7.5-325 MG tablet 1 q4 hours prn (Patient taking differently: 1/2 tab as needed)   ibuprofen (ADVIL) 400 MG tablet Take 400 mg by mouth every 6 (six) hours as needed. As needed for pain   levothyroxine (SYNTHROID) 88 MCG tablet TAKE 1/2 TABLET ON MONDAY AND FRIDAYS AND TAKE 1 WHOLE TABLET ALL OTHER DAYS   losartan (COZAAR) 50 MG tablet TAKE 1 TABLET EVERY DAY   metFORMIN (GLUCOPHAGE) 500 MG tablet TAKE 2 TABLETS TWICE DAILY   Multiple Vitamins-Minerals (MULTIVITAMIN WITH MINERALS) tablet Take 1 tablet by mouth daily.   pantoprazole (PROTONIX) 40 MG tablet TAKE 1 TABLET EVERY DAY   phenylephrine-shark liver oil-mineral oil-petrolatum (PREPARATION H) 0.25-3-14-71.9 % rectal ointment Place 1 application rectally as needed for hemorrhoids.   polyethylene glycol (MIRALAX / GLYCOLAX) packet Take 17 g by mouth daily.    TRUE METRIX BLOOD GLUCOSE TEST test strip TEST BLOOD SUGAR EVERY DAY   TRUEplus Lancets 33G  MISC TEST ONE TO TWO TIMES DAILY AS NEEDED DUE TO FLUCTUATING GLUCOSE   metoprolol tartrate (LOPRESSOR) 100 MG tablet Take 1 tablet (100 mg total) by mouth once for 1 dose.   nitroGLYCERIN (NITROSTAT) 0.4 MG SL tablet Place 1 tablet (0.4 mg total) under the tongue every 5 (five) minutes x 3 doses as needed for chest pain. (Patient not taking: Reported on 07/11/2023)   No facility-administered encounter medications on file as of 07/19/2023.     Allergies (verified) Atorvastatin   History: Past Medical History:  Diagnosis Date   Diabetes (HCC)    Fatty liver    with mild LFT elevation, F2/F3 2017   Hemorrhoid    Hyperlipidemia    Hypertension    Hypothyroidism    Lipoma of neck    Past Surgical History:  Procedure Laterality Date   ABDOMINAL HYSTERECTOMY     AGILE CAPSULE N/A 06/14/2020   Procedure: AGILE CAPSULE;  Surgeon: Corbin Ade, MD; unable to swallow agile   APPENDECTOMY     CATARACT EXTRACTION Bilateral 11/01/2022   right eye , feb 2024 left eye   COLONOSCOPY  05/30/2005   ZOX:WRUEAV anal papilla, otherwise normal rectum.and colon   COLONOSCOPY N/A 11/04/2015   RMR: normal colonoscopy   COLONOSCOPY WITH PROPOFOL N/A 04/07/2020   Procedure: COLONOSCOPY WITH PROPOFOL;  Surgeon: Corbin Ade, MD; One 4 mm polyp at ileocecal valve, otherwise normal exam.  Patient was heme-negative.  Pathology with tubular adenoma.  No recommendations to repeat colonoscopy.   DILATION AND CURETTAGE OF UTERUS     ESOPHAGEAL DILATION  04/07/2020   Procedure: ESOPHAGEAL DILATION;  Surgeon: Corbin Ade, MD;  [facility];  Service: Endoscopy;;   ESOPHAGOGASTRODUODENOSCOPY (EGD) WITH PROPOFOL N/A 04/07/2020   Procedure: ESOPHAGOGASTRODUODENOSCOPY (EGD) WITH PROPOFOL;  Surgeon: Corbin Ade, MD;  Small Schatzki's ring s/p dilated, small hiatal hernia, otherwise normal exam.   POLYPECTOMY  04/07/2020   Procedure: POLYPECTOMY;  Surgeon: Corbin Ade, MD;  Location: [facility];  Service: Endoscopy;;   Family History  Problem Relation Age of Onset   Heart disease Mother    Hypertension Mother    Birth defects Daughter    Colon cancer Neg Hx    Liver disease Neg Hx    Colon polyps Neg Hx    Social History   Socioeconomic History   Marital status: Married    Spouse name: Therapist, nutritional   Number of children: 4   Years of education: Not on file   Highest education level: Not on file  Occupational  History   Not on file  Tobacco Use   Smoking status: Never   Smokeless tobacco: Never  Substance and Sexual Activity   Alcohol use: No    Alcohol/week: 0.0 standard drinks of alcohol   Drug use: No   Sexual activity: Yes    Birth control/protection: Surgical    Comment: hyst  Other Topics Concern   Not on file  Social History Narrative   Total of 4 kids between pt and husband. 3 living.    Married x 28 years.    Social Determinants of Health   Financial Resource Strain: Low Risk  (07/19/2023)   Overall Financial Resource Strain (CARDIA)    Difficulty of Paying Living Expenses: Not hard at all  Food Insecurity: No Food Insecurity (07/19/2023)   Hunger Vital Sign    Worried About Running Out of Food in the Last Year: Never true    Ran Out of  Food in the Last Year: Never true  Transportation Needs: No Transportation Needs (07/19/2023)   PRAPARE - Administrator, Civil Service (Medical): No    Lack of Transportation (Non-Medical): No  Physical Activity: Insufficiently Active (07/19/2023)   Exercise Vital Sign    Days of Exercise per Week: 3 days    Minutes of Exercise per Session: 30 min  Stress: No Stress Concern Present (07/19/2023)   [employer] of Occupational Health - Occupational Stress Questionnaire    Feeling of Stress : Not at all  Social Connections: Moderately Integrated (07/19/2023)   Social Connection and Isolation Panel [NHANES]    Frequency of Communication with Friends and Family: More than three times a week    Frequency of Social Gatherings with Friends and Family: More than three times a week    Attends Religious Services: More than 4 times per year    Active Member of [employer] or Organizations: No    Attends Engineer, structural: Never    Marital Status: Married    Tobacco Counseling Counseling given: Not Answered   Clinical Intake:  Pre-visit preparation completed: Yes  Pain : No/denies pain     Nutritional Risks:  None Diabetes: Yes CBG done?: No Did pt. bring in CBG monitor from home?: No  How often do you need to have someone help you when you read instructions, pamphlets, or other written materials from your doctor or pharmacy?: 1 - Never  Interpreter Needed?: No  Information entered by :: Renie Ora, LPN   Activities of Daily Living    07/19/2023   10:14 AM  In your present state of health, do you have any difficulty performing the following activities:  Hearing? 0  Vision? 0  Difficulty concentrating or making decisions? 0  Walking or climbing stairs? 0  Dressing or bathing? 0  Doing errands, shopping? 0  Preparing Food and eating ? N  Using the Toilet? N  In the past six months, have you accidently leaked urine? N  Do you have problems with loss of bowel control? N  Managing your Medications? N  Managing your Finances? N  Housekeeping or managing your Housekeeping? N    Patient Care Team: Babs Sciara, MD as PCP - General (Family Medicine) Mallipeddi, Orion Modest, MD as PCP - Cardiology (Cardiology) Jena Gauss Gerrit Friends, MD as Consulting Physician (Gastroenterology)  Indicate any recent Medical Services you may have received from other than [facility] providers in the past year (date may be approximate).     Assessment:   This is a routine wellness examination for Nicole Lam.  Hearing/Vision screen Vision Screening - Comments:: Wears rx glasses - up to date with routine eye exams with  Dr.cotter    Goals Addressed             This Visit's Progress    DIET - EAT MORE FRUITS AND VEGETABLES   On track      Depression Screen    07/19/2023   10:12 AM 07/03/2023   10:46 AM 05/13/2023    3:27 PM 02/12/2023    9:16 AM 08/08/2022    1:13 PM 07/10/2022    3:06 PM 08/11/2021    1:19 PM  PHQ 2/9 Scores  PHQ - 2 Score 0 0 0 0 0 0 0  PHQ- 9 Score 0 3 4 5   0     Fall Risk    07/19/2023   10:11 AM 07/03/2023   10:45 AM 05/13/2023    3:29  PM 02/12/2023    9:16 AM 08/08/2022    1:13 PM   Fall Risk   Falls in the past year? 0 0 0 0 0  Number falls in past yr: 0 0 0 0 0  Injury with Fall? 0 0 0 0 0  Risk for fall due to : No Fall Risks  No Fall Risks  No Fall Risks  Follow up Falls prevention discussed  Falls evaluation completed  Falls evaluation completed    MEDICARE RISK AT HOME: Medicare Risk at Home Any stairs in or around the home?: No If so, are there any without handrails?: No Home free of loose throw rugs in walkways, pet beds, electrical cords, etc?: Yes Adequate lighting in your home to reduce risk of falls?: Yes Life alert?: No Use of a cane, walker or w/c?: No Grab bars in the bathroom?: Yes Shower chair or bench in shower?: Yes Elevated toilet seat or a handicapped toilet?: Yes  TIMED UP AND GO:  Was the test performed?  No    Cognitive Function:        07/19/2023   10:14 AM 07/10/2022    3:09 PM 07/04/2021    4:07 PM  6CIT Screen  What Year? 0 points 0 points 0 points  What month? 0 points 0 points 0 points  What time? 0 points 0 points 0 points  Count back from 20 0 points 0 points 0 points  Months in reverse 0 points 0 points 0 points  Repeat phrase 0 points 0 points 0 points  Total Score 0 points 0 points 0 points    Immunizations Immunization History  Administered Date(s) Administered   Fluad Quad(high Dose 65+) 08/11/2020, 08/11/2021, 08/08/2022   Fluad Trivalent(High Dose 65+) 07/03/2023   Influenza Split 07/16/2015   Influenza, High Dose Seasonal PF 08/13/2019   Influenza,inj,Quad PF,6+ Mos 08/08/2018   Influenza-Unspecified 112/31/51, 08/03/2016, 08/13/2019   Moderna Sars-Covid-2 Vaccination 02/12/2020, 03/11/2020   Pneumococcal Conjugate-13 09/16/2015   Pneumococcal Polysaccharide-23 09/20/2016   Zoster, Live 10/15/2013    TDAP status: Due, Education has been provided regarding the importance of this vaccine. Advised may receive this vaccine at local pharmacy or Health Dept. Aware to provide a copy of the vaccination  record if obtained from local pharmacy or Health Dept. Verbalized acceptance and understanding.  Flu Vaccine status: Due, Education has been provided regarding the importance of this vaccine. Advised may receive this vaccine at local pharmacy or Health Dept. Aware to provide a copy of the vaccination record if obtained from local pharmacy or Health Dept. Verbalized acceptance and understanding.  Pneumococcal vaccine status: Up to date  Covid-19 vaccine status: Completed vaccines  Qualifies for Shingles Vaccine? Yes   Zostavax completed No   Shingrix Completed?: No.    Education has been provided regarding the importance of this vaccine. Patient has been advised to call insurance company to determine out of pocket expense if they have not yet received this vaccine. Advised may also receive vaccine at local pharmacy or Health Dept. Verbalized acceptance and understanding.  Screening Tests Health Maintenance  Topic Date Due   DTaP/Tdap/Td (1 - Tdap) Never done   Zoster Vaccines- Shingrix (1 of 2) 10/17/1968   COVID-19 Vaccine (3 - Moderna risk series) 04/08/2020   OPHTHALMOLOGY EXAM  02/02/2023   HEMOGLOBIN A1C  12/18/2023   FOOT EXAM  02/12/2024   Diabetic kidney evaluation - eGFR measurement  06/19/2024   Diabetic kidney evaluation - Urine ACR  06/19/2024   Medicare  Annual Wellness (AWV)  07/18/2024   MAMMOGRAM  08/16/2024   Colonoscopy  04/07/2030   Pneumonia Vaccine 45+ Years old  Completed   INFLUENZA VACCINE  Completed   DEXA SCAN  Completed   Hepatitis C Screening  Completed   HPV VACCINES  Aged Out    Health Maintenance  Health Maintenance Due  Topic Date Due   DTaP/Tdap/Td (1 - Tdap) Never done   Zoster Vaccines- Shingrix (1 of 2) 10/17/1968   COVID-19 Vaccine (3 - Moderna risk series) 04/08/2020   OPHTHALMOLOGY EXAM  02/02/2023    Colorectal cancer screening: Type of screening: Colonoscopy. Completed 04/07/2020. Repeat every 10 years  Mammogram status: Ordered  scheduled 08/19/2023. Pt provided with contact info and advised to call to schedule appt.   Bone Density status: Completed 04/04/2017. Results reflect: Bone density results: NORMAL. Repeat every 5 years.  Lung Cancer Screening: (Low Dose CT Chest recommended if Age 59-80 years, 20 pack-year currently smoking OR have quit w/in 15years.) does not qualify.   Lung Cancer Screening Referral: n/a  Additional Screening:  Hepatitis C Screening: does not qualify; Completed 12/19/2020  Vision Screening: Recommended annual ophthalmology exams for early detection of glaucoma and other disorders of the eye. Is the patient up to date with their annual eye exam?  Yes  Who is the provider or what is the name of the office in which the patient attends annual eye exams? Dr.Cotter  If pt is not established with a provider, would they like to be referred to a provider to establish care? No .   Dental Screening: Recommended annual dental exams for proper oral hygiene   Community Resource Referral / Chronic Care Management: CRR required this visit?  No   CCM required this visit?  No     Plan:     I have personally reviewed and noted the following in the patient's chart:   Medical and social history Use of alcohol, tobacco or illicit drugs  Current medications and supplements including opioid prescriptions. Patient is not currently taking opioid prescriptions. Functional ability and status Nutritional status Physical activity Advanced directives List of other physicians Hospitalizations, surgeries, and ER visits in previous 12 months Vitals Screenings to include cognitive, depression, and falls Referrals and appointments  In addition, I have reviewed and discussed with patient certain preventive protocols, quality metrics, and best practice recommendations. A written personalized care plan for preventive services as well as general preventive health recommendations were provided to patient.      Lorrene Reid, LPN   16/10/958   After Visit Summary: (MyChart) Due to this being a telephonic visit, the after visit summary with patients personalized plan was offered to patient via MyChart   Nurse Notes: none

## 2023-07-19 NOTE — Patient Instructions (Signed)
Nicole Lam , Thank you for taking time to come for your Medicare Wellness Visit. I appreciate your ongoing commitment to your health goals. Please review the following plan we discussed and let me know if I can assist you in the future.   Referrals/Orders/Follow-Ups/Clinician Recommendations: Aim for 30 minutes of exercise or brisk walking, 6-8 glasses of water, and 5 servings of fruits and vegetables each day.   This is a list of the screening recommended for you and due dates:  Health Maintenance  Topic Date Due   DTaP/Tdap/Td vaccine (1 - Tdap) Never done   Zoster (Shingles) Vaccine (1 of 2) 10/17/1968   COVID-19 Vaccine (3 - Moderna risk series) 04/08/2020   Eye exam for diabetics  02/02/2023   Hemoglobin A1C  12/18/2023   Complete foot exam   02/12/2024   Yearly kidney function blood test for diabetes  06/19/2024   Yearly kidney health urinalysis for diabetes  06/19/2024   Medicare Annual Wellness Visit  07/18/2024   Mammogram  08/16/2024   Colon Cancer Screening  04/07/2030   Pneumonia Vaccine  Completed   Flu Shot  Completed   DEXA scan (bone density measurement)  Completed   Hepatitis C Screening  Completed   HPV Vaccine  Aged Out    Advanced directives: (Provided) Advance directive discussed with you today. I have provided a copy for you to complete at home and have notarized. Once this is complete, please bring a copy in to our office so we can scan it into your chart. Information on Advanced Care Planning can be found at [facility] Advance Health Care Directives Advance Health Care Directives ([email or web address]    Next Medicare Annual Wellness Visit scheduled for next year: Yes  Insert Preventive Care attachment Insert FALL PREVENTION attachment if needed

## 2023-07-23 ENCOUNTER — Other Ambulatory Visit: Payer: Self-pay | Admitting: *Deleted

## 2023-07-23 ENCOUNTER — Other Ambulatory Visit: Payer: Self-pay | Admitting: Gastroenterology

## 2023-07-23 DIAGNOSIS — K7400 Hepatic fibrosis, unspecified: Secondary | ICD-10-CM

## 2023-07-23 DIAGNOSIS — K7581 Nonalcoholic steatohepatitis (NASH): Secondary | ICD-10-CM

## 2023-07-23 NOTE — Progress Notes (Signed)
Error

## 2023-07-23 NOTE — Progress Notes (Signed)
error 

## 2023-08-02 ENCOUNTER — Telehealth: Payer: Self-pay | Admitting: *Deleted

## 2023-08-02 NOTE — Telephone Encounter (Signed)
Paperwork has been completed and returned to [place], [place].

## 2023-08-02 NOTE — Telephone Encounter (Signed)
Faxed papers to [facility]. Waiting on response

## 2023-08-02 NOTE — Telephone Encounter (Signed)
Forms from [facility] came for [employer]. Need to be filled out and signed. Placed on providers desk.

## 2023-08-03 DIAGNOSIS — R053 Chronic cough: Secondary | ICD-10-CM | POA: Diagnosis not present

## 2023-08-06 ENCOUNTER — Ambulatory Visit (INDEPENDENT_AMBULATORY_CARE_PROVIDER_SITE_OTHER): Payer: Medicare HMO | Admitting: Family Medicine

## 2023-08-06 ENCOUNTER — Other Ambulatory Visit: Payer: Self-pay | Admitting: Family Medicine

## 2023-08-06 ENCOUNTER — Telehealth: Payer: Self-pay | Admitting: Gastroenterology

## 2023-08-06 ENCOUNTER — Other Ambulatory Visit: Payer: Self-pay | Admitting: Obstetrics & Gynecology

## 2023-08-06 VITALS — BP 153/77 | HR 98 | Temp 98.5°F | Ht <= 58 in | Wt 123.6 lb

## 2023-08-06 DIAGNOSIS — J209 Acute bronchitis, unspecified: Secondary | ICD-10-CM

## 2023-08-06 MED ORDER — PROMETHAZINE-DM 6.25-15 MG/5ML PO SYRP: 5.0000 mL | ORAL_SOLUTION | Freq: Four times a day (QID) | ORAL | 0 refills | Status: DC | PRN

## 2023-08-06 MED ORDER — HYDROCODONE-ACETAMINOPHEN 7.5-325 MG PO TABS: ORAL_TABLET | ORAL | 0 refills | Status: DC

## 2023-08-06 MED ORDER — DOXYCYCLINE HYCLATE 100 MG PO TABS: 100.0000 mg | ORAL_TABLET | Freq: Two times a day (BID) | ORAL | 0 refills | Status: DC

## 2023-08-06 NOTE — Assessment & Plan Note (Signed)
Treating with Doxycycline and Promethazine DM.

## 2023-08-06 NOTE — Progress Notes (Signed)
Subjective:  Patient ID: Nicole Lam, female    DOB: Nov 15, 1950  Age: 72 y.o. MRN: [id number]  CC:  Respiratory symptoms   HPI:  72 year old female presents for evaluation of the above.   Patient reports that she has been sick for the past 2 weeks. Has been having sore throat, cough and congestion. No fever. No relieving factors. No other associated symptoms. No other complaints.   Patient Active Problem List   Diagnosis Date Noted   Acute bronchitis 08/06/2023   Metabolic dysfunction-associated steatotic liver disease (MASLD) 07/11/2023   HTN (hypertension) 08/06/2022   Statin intolerance 08/06/2022   Osteoarthritis, hand 11/24/2021   [name] (nonalcoholic steatohepatitis) 06/21/2021   Hyperlipidemia associated with type 2 diabetes mellitus (HCC) 08/11/2020   Iron deficiency anemia 01/12/2020   External hemorrhoid 07/15/2017   DM type 2 (diabetes mellitus, type 2) (HCC) 09/16/2015   Herpes simplex type II infection 06/05/2015   GERD (gastroesophageal reflux disease) 03/30/2014   Chronic back pain 03/30/2014   Peripheral neuropathy 10/20/2013   Hypothyroidism 03/23/2013    Social Hx   Social History   Socioeconomic History   Marital status: Married    Spouse name: Therapist, nutritional   Number of children: 4   Years of education: Not on file   Highest education level: 12th grade  Occupational History   Not on file  Tobacco Use   Smoking status: Never   Smokeless tobacco: Never  Substance and Sexual Activity   Alcohol use: No    Alcohol/week: 0.0 standard drinks of alcohol   Drug use: No   Sexual activity: Yes    Birth control/protection: Surgical    Comment: hyst  Other Topics Concern   Not on file  Social History Narrative   Total of 4 kids between pt and husband. 3 living.    Married x 28 years.    Social Determinants of Health   Financial Resource Strain: Medium Risk (08/05/2023)   Overall Financial Resource Strain (CARDIA)    Difficulty of Paying Living Expenses:  Somewhat hard  Food Insecurity: Food Insecurity Present (08/05/2023)   Hunger Vital Sign    Worried About Running Out of Food in the Last Year: Patient declined    Ran Out of Food in the Last Year: Sometimes true  Transportation Needs: No Transportation Needs (08/05/2023)   PRAPARE - Administrator, Civil Service (Medical): No    Lack of Transportation (Non-Medical): No  Physical Activity: Inactive (08/05/2023)   Exercise Vital Sign    Days of Exercise per Week: 0 days    Minutes of Exercise per Session: 30 min  Stress: No Stress Concern Present (08/05/2023)   [employer] of Occupational Health - Occupational Stress Questionnaire    Feeling of Stress : Only a little  Social Connections: Moderately Integrated (08/05/2023)   Social Connection and Isolation Panel [NHANES]    Frequency of Communication with Friends and Family: Once a week    Frequency of Social Gatherings with Friends and Family: Once a week    Attends Religious Services: More than 4 times per year    Active Member of [employer] or Organizations: Yes    Attends Engineer, structural: More than 4 times per year    Marital Status: Married    Review of Systems Per HPI  Objective:  BP (!) 153/77   Pulse 98   Temp 98.5 F (36.9 C)   Ht 4\' 10"  (1.473 m)   Wt 123 lb 9.6  oz (56.1 kg)   SpO2 97%   BMI 25.83 kg/m      08/06/2023    3:30 PM 07/19/2023   10:10 AM 07/11/2023    1:09 PM  BP/Weight  Systolic BP 153 -- 130  Diastolic BP 77 -- 70  Wt. (Lbs) 123.6 124 123.4  BMI 25.83 kg/m2 25.92 kg/m2 25.79 kg/m2    Physical Exam Vitals and nursing note reviewed.  Constitutional:      General: She is not in acute distress.    Appearance: Normal appearance.  HENT:     Head: Normocephalic and atraumatic.     Mouth/Throat:     Pharynx: Oropharynx is clear.  Cardiovascular:     Rate and Rhythm: Normal rate and regular rhythm.  Pulmonary:     Effort: Pulmonary effort is normal.     Breath  sounds: Normal breath sounds. No wheezing or rales.  Neurological:     Mental Status: She is alert.     Lab Results  Component Value Date   WBC 6.0 07/15/2023   HGB 12.0 07/15/2023   HCT 37.1 07/15/2023   PLT 283 07/15/2023   GLUCOSE 149 (H) 06/20/2023   CHOL 238 (H) 07/15/2023   TRIG 229 (H) 07/15/2023   HDL 35 (L) 02/07/2023   LDLCALC 185 (H) 02/07/2023   ALT 45 (H) 07/15/2023   AST 39 07/15/2023   NA 139 06/20/2023   K 4.7 06/20/2023   CL 100 06/20/2023   CREATININE 0.98 06/20/2023   BUN 13 06/20/2023   CO2 25 06/20/2023   TSH 2.120 02/07/2023   INR 1.1 12/19/2020   HGBA1C 7.8 (H) 06/20/2023   MICROALBUR 0.2 10/16/2014     Assessment & Plan:   Problem List Items Addressed This Visit       Respiratory   Acute bronchitis - Primary    Treating with Doxycycline and Promethazine DM.       Meds ordered this encounter  Medications   doxycycline (VIBRA-TABS) 100 MG tablet    Sig: Take 1 tablet (100 mg total) by mouth 2 (two) times daily.    Dispense:  14 tablet    Refill:  0   promethazine-dextromethorphan (PROMETHAZINE-DM) 6.25-15 MG/5ML syrup    Sig: Take 5 mLs by mouth 4 (four) times daily as needed for cough.    Dispense:  118 mL    Refill:  0    Follow-up:  Return if symptoms worsen or fail to improve.  Everlene Other DO [facility]

## 2023-08-06 NOTE — Telephone Encounter (Signed)
Patient came to front office window. She said she had filled out some papers for a medication and was told that she needed to date it. I told her the nurse would be back tomorrow (Wednesday) and I needed to get with her because I did not know where she had put the papers. Please call [phone] or [phone]

## 2023-08-06 NOTE — Progress Notes (Signed)
Patient uses pain medicine sparingly.  She is responsible with it.  She knows not to operate machinery if feeling drowsy.  We will send in a refill she requested a refill through her visit today

## 2023-08-06 NOTE — Patient Instructions (Signed)
Medications as prescribed.  Take antibiotic with food and water.  Take care  Dr. Adriana Simas

## 2023-08-07 NOTE — Telephone Encounter (Signed)
Spoke with [employer] patient support.  They requested that we fax the approval letter to them at [phone] and also have the patient call them back as they are trying to reach her to start the process for patient assistance evaluation.    The patient can call them back at [phone] with extension [phone].

## 2023-08-07 NOTE — Telephone Encounter (Signed)
Spoke to pt, she informed me that the forms needed to be dated and faxed back to [facility]. I signed and faxed back. Pt was approved for Rezdiffra, but was told it was 95$ and she can't afford it.

## 2023-08-08 NOTE — Telephone Encounter (Signed)
Faxed approval letter to [facility]. Informed pt to call them. She voiced understanding.

## 2023-08-08 NOTE — Telephone Encounter (Signed)
Noted  

## 2023-08-16 ENCOUNTER — Telehealth: Payer: Self-pay | Admitting: *Deleted

## 2023-08-16 DIAGNOSIS — J411 Mucopurulent chronic bronchitis: Secondary | ICD-10-CM | POA: Diagnosis not present

## 2023-08-16 NOTE — Telephone Encounter (Signed)
Pt called and wanted to know if she needed labs done next week, because she has a mammogram and would like to have them done than. Also was mailed a letter stating she was approved for Rezdiffra, but it was 95$ a month. Than got another letter stating she was denied.

## 2023-08-18 NOTE — Telephone Encounter (Signed)
Nicole Lam, can you reach out to [employer] to see what is going on with Nicole Lam? I though they were going to try to help her with the cost of the medication. Not sure if this is as low as they can get it. Not sure what the denial letter is about.   She doesn't need labs. The plan was to have labs completed after she had been on Nicole Lam for 4 weeks. As she hasn't started Nicole Lam, no need to do labs.

## 2023-08-19 ENCOUNTER — Ambulatory Visit (HOSPITAL_COMMUNITY): Payer: Medicare HMO

## 2023-08-20 NOTE — Telephone Encounter (Signed)
Noted. Is this something she will be able to afford? If not, I understand. This is not something she has to have, but is a new medication that has been approved to help prevent progression of liver fibrosis secondary to fatty liver, so it would be great if she could get it, but I understand if it is too expensive.

## 2023-08-20 NOTE — Telephone Encounter (Signed)
Spoke to [place] and pt was approved for Rezdiffra, but 95$ is her copy. She has to pay that part. There is no assistance for that, her insurance has paid there part and so has [employer].

## 2023-08-22 MED ORDER — FLUCONAZOLE 150 MG PO TABS: 150.0000 mg | ORAL_TABLET | Freq: Once | ORAL | 0 refills | Status: AC

## 2023-08-22 NOTE — Telephone Encounter (Signed)
Called and spoke to patient. Patient states no severe pelvic pain, vaginal bleeding, or fevers. Patient states she has itching, redness, and irritation. Patient states she just finished around of antibiotics for COVID. Medication is being sent to pharmacy. Per office protocol.   Copied from CRM [id number]. Topic: Clinical - Medication Refill >> Aug 22, 2023  9:47 AM Cassiday T wrote: Most Recent Primary Care Visit:  Provider: Tommie Sams  Department: [facility]  Visit Type: OFFICE VISIT  Date: 08/06/2023  Medication: fluconazole medication for yeast   Has the patient contacted their pharmacy? No (Agent: If no, request that the patient contact the pharmacy for the refill. If patient does not wish to contact the pharmacy document the reason why and proceed with request.) (Agent: If yes, when and what did the pharmacy advise?)  Is this the correct pharmacy for this prescription? No If no, delete pharmacy and type the correct one.  This is the patient's preferred pharmacy:  [facility] - [place], [place] - [address] [place] [address] Phone: [phone] Fax: [phone]  [facility] [address], [place] - [address] [place] [address] Phone: [phone] Fax: [phone]   Has the prescription been filled recently? No  Is the patient out of the medication? No  Has the patient been seen for an appointment in the last year OR does the patient have an upcoming appointment? Yes  Can we respond through MyChart? No  Agent: Please be advised that Rx refills may take up to 3 business days. We ask that you follow-up with your pharmacy.

## 2023-08-22 NOTE — Telephone Encounter (Signed)
Spoke to pt, she states no she can't afford that medications. Would like to know if there is anything cheaper she could try?

## 2023-08-22 NOTE — Addendum Note (Signed)
Addended by: Elizbeth Squires on: 08/22/2023 10:48 AM   Modules accepted: Orders

## 2023-08-22 NOTE — Telephone Encounter (Signed)
We need to find out if patient reached back out to [facility] patient support as previously recommended.  Spoke with drug rep today who states it looks like the financial assistance was canceled, but it does not say why.  It may be that she did not reach back out to them to answer their specific questions.  The drug rep did tell me today that if she qualified for financial assistance, the drug would be covered and full.  Unfortunately, there is not an alternative to Rezdiffra.

## 2023-08-23 NOTE — Telephone Encounter (Signed)
Spoke to pt informed her to call [employer] again and give them her financial information. She voiced understanding.

## 2023-09-04 ENCOUNTER — Ambulatory Visit (HOSPITAL_COMMUNITY)
Admission: RE | Admit: 2023-09-04 | Discharge: 2023-09-04 | Disposition: A | Discharge status: Home / Self Care | Payer: Medicare HMO | Source: Ambulatory Visit | Attending: Family Medicine | Admitting: Family Medicine

## 2023-09-04 DIAGNOSIS — Z1231 Encounter for screening mammogram for malignant neoplasm of breast: Secondary | ICD-10-CM | POA: Diagnosis not present

## 2023-09-23 ENCOUNTER — Encounter: Payer: Self-pay | Admitting: Gastroenterology

## 2023-10-19 NOTE — Progress Notes (Signed)
 Referring Provider: Alphonsa Glendia LABOR, MD Primary Care Physician:  Alphonsa Glendia LABOR, MD Primary GI Physician: Dr. Shaaron  Chief Complaint  Patient presents with   Follow-up    Follow up. No problems     HPI:   Nicole Lam is a 73 y.o. 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  Extensive serologies in the past including hepatitis B, hepatitis C, smooth muscle antibody, mitochondrial antibody, ANA, immunoglobulins, ceruloplasmin, TTG IgA all negative.    EGD and colonoscopy June 2021 for IDA with 1 tubular adenoma, small Schatzki's ring s/p dilation, small hiatal hernia, otherwise normal exam. Patient unable to swallow Givens capsule.  Offered endoscopic placement patency capsule followed by endoscopic placement of Givens capsule if appropriate, but patient declined.  Also previously offered CT enterography, but patient also declined due to contrast causing nausea.  She was treated with oral iron with normalization of hemoglobin and iron panel in March 2022.   Last seen in the office 07/11/2023.  She had no symptoms of decompensated liver disease.  She had continued to lose weight slowly as she had changed her diet, no longer eating out.  No overt GI bleeding.  Constipation controlled with MiraLAX daily.  No upper GI symptoms, maintaining on pantoprazole  daily.  Plan included updating LFTs, [name], elf to see if patient would be a candidate for Rezdiffra , continue MiraLAX, pantoprazole , and update CBC due to history of IDA.  Labs completed 07/15/2023 showing hemoglobin of 12.1 with normocytic indices, [name] with F2 fibrosis, elf elevated at 10.1, ALT 45, AST normalized.  Recommended starting Rezdiffra .  She was approved, but was not able to afford it as it was $95.  I spoke with [employer] patient support and  they were going to try to reach the patient to start the patient assistance evaluation.  There was some difficulty with [employer] reaching patient.  We had advised for the patient to call them back, but it is unclear if she ever did so and patient support was canceled.  Today: Doing well overall. No GI concerns for mw. Bowels are moving well with MiraLAX.  No BRBPR or melena.  Denies abdominal pain, nausea, vomiting, reflux symptoms.  Occasionally she does have pills getting stuck in her esophagus, but this has been unchanged for years and does not feel that she needs further evaluation at this time.  No food or liquid dysphagia.  Regarding Rezdiffra , patient states that the patient assistance program asked for her to send them all of her prescription medication cost.  She states she does not have any prescription medication cost as she gets them through [facility] for 90-day supply with $0 co-pay.  She is not sure if this precluded her from qualifying for Rezdiffra  as she did not call back thereafter.  Past Medical History:  Diagnosis Date   Diabetes (HCC)    Fatty liver    with mild LFT elevation, F2/F3 2017   Hemorrhoid    Hyperlipidemia    Hypertension    Hypothyroidism    Lipoma of neck     Past Surgical History:  Procedure Laterality Date   ABDOMINAL HYSTERECTOMY     AGILE CAPSULE N/A 06/14/2020   Procedure: AGILE CAPSULE;  Surgeon: Shaaron Lamar HERO, MD; unable to swallow agile   APPENDECTOMY     CATARACT EXTRACTION Bilateral 11/01/2022  right eye , feb 2024 left eye   COLONOSCOPY  05/30/2005   MFM:Dpwhoz anal papilla, otherwise normal rectum.and colon   COLONOSCOPY N/A 11/04/2015   RMR: normal colonoscopy   COLONOSCOPY WITH PROPOFOL  N/A 04/07/2020   Procedure: COLONOSCOPY WITH PROPOFOL ;  Surgeon: Shaaron Lamar HERO, MD; One 4 mm polyp at ileocecal valve, otherwise normal exam.  Patient was heme-negative.  Pathology with tubular adenoma.  No recommendations to repeat colonoscopy.    DILATION AND CURETTAGE OF UTERUS     ESOPHAGEAL DILATION  04/07/2020   Procedure: ESOPHAGEAL DILATION;  Surgeon: Shaaron Lamar HERO, MD;  [facility];  Service: Endoscopy;;   ESOPHAGOGASTRODUODENOSCOPY (EGD) WITH PROPOFOL  N/A 04/07/2020   Procedure: ESOPHAGOGASTRODUODENOSCOPY (EGD) WITH PROPOFOL ;  Surgeon: Shaaron Lamar HERO, MD;  Small Schatzki's ring s/p dilated, small hiatal hernia, otherwise normal exam.   POLYPECTOMY  04/07/2020   Procedure: POLYPECTOMY;  Surgeon: Shaaron Lamar HERO, MD;  Location: [facility];  Service: Endoscopy;;    Current Outpatient Medications  Medication Sig Dispense Refill   Blood Glucose Monitoring Suppl (TRUE METRIX METER) w/Device KIT USE AS DIRECTED 1 kit 0   estradiol  (ESTRACE ) 0.5 MG tablet TAKE 1 TABLET EVERY DAY 90 tablet 3   ezetimibe  (ZETIA ) 10 MG tablet TAKE 1 TABLET EVERY DAY 90 tablet 3   gabapentin  (NEURONTIN ) 100 MG capsule Take 1 capsule (100 mg total) by mouth 3 (three) times daily. (Patient taking differently: Take 100 mg by mouth 3 (three) times daily. At night only) 90 capsule 3   glipiZIDE  (GLUCOTROL  XL) 5 MG 24 hr tablet TAKE 1 TABLET EVERY MORNING AND TAKE 2 TABLETS EVERY EVENING 270 tablet 3   HYDROcodone -acetaminophen  (NORCO) 7.5-325 MG tablet 1 q4 hours prn 30 tablet 0   ibuprofen (ADVIL) 400 MG tablet Take 400 mg by mouth every 6 (six) hours as needed. As needed for pain     levothyroxine  (SYNTHROID ) 88 MCG tablet TAKE 1/2 TABLET ON MONDAY AND FRIDAYS AND TAKE 1 WHOLE TABLET ALL OTHER DAYS 78 tablet 3   losartan  (COZAAR ) 50 MG tablet TAKE 1 TABLET EVERY DAY 90 tablet 3   metFORMIN  (GLUCOPHAGE ) 500 MG tablet TAKE 2 TABLETS TWICE DAILY 360 tablet 3   Multiple Vitamins-Minerals (MULTIVITAMIN WITH MINERALS) tablet Take 1 tablet by mouth daily.     pantoprazole  (PROTONIX ) 40 MG tablet TAKE 1 TABLET EVERY DAY 90 tablet 3   phenylephrine -shark liver oil-mineral oil-petrolatum (PREPARATION H) 0.25-3-14-71.9 % rectal ointment Place 1  application rectally as needed for hemorrhoids.     polyethylene glycol (MIRALAX / GLYCOLAX) packet Take 17 g by mouth daily.      TRUE METRIX BLOOD GLUCOSE TEST test strip TEST BLOOD SUGAR EVERY DAY 100 strip 0   TRUEplus Lancets 33G MISC TEST ONE TO TWO TIMES DAILY AS NEEDED DUE TO FLUCTUATING GLUCOSE 200 each 0   diclofenac  sodium (VOLTAREN ) 1 % GEL Apply 4 g topically 4 (four) times daily. (Patient taking differently: Apply 4 g topically 2 (two) times daily as needed (pain).) 5 Tube 5   No current facility-administered medications for this visit.    Allergies as of 10/23/2023 - Review Complete 10/23/2023  Allergen Reaction Noted   Atorvastatin   11/11/2021    Family History  Problem Relation Age of Onset   Heart disease Mother    Hypertension Mother    Birth defects Daughter    Colon cancer Neg Hx    Liver disease Neg Hx    Colon polyps Neg Hx  Social History   Socioeconomic History   Marital status: Married    Spouse name: Sherida   Number of children: 4   Years of education: Not on file   Highest education level: 12th grade  Occupational History   Not on file  Tobacco Use   Smoking status: Never   Smokeless tobacco: Never  Substance and Sexual Activity   Alcohol use: No    Alcohol/week: 0.0 standard drinks of alcohol   Drug use: No   Sexual activity: Yes    Birth control/protection: Surgical    Comment: hyst  Other Topics Concern   Not on file  Social History Narrative   Total of 4 kids between pt and husband. 3 living.    Married x 28 years.    Social Drivers of Health   Financial Resource Strain: Medium Risk (08/05/2023)   Overall Financial Resource Strain (CARDIA)    Difficulty of Paying Living Expenses: Somewhat hard  Food Insecurity: Food Insecurity Present (08/05/2023)   Hunger Vital Sign    Worried About Running Out of Food in the Last Year: Patient declined    Ran Out of Food in the Last Year: Sometimes true  Transportation Needs: No  Transportation Needs (08/05/2023)   PRAPARE - Administrator, Civil Service (Medical): No    Lack of Transportation (Non-Medical): No  Physical Activity: Inactive (08/05/2023)   Exercise Vital Sign    Days of Exercise per Week: 0 days    Minutes of Exercise per Session: 30 min  Stress: No Stress Concern Present (08/05/2023)   [employer] of Occupational Health - Occupational Stress Questionnaire    Feeling of Stress : Only a little  Social Connections: Moderately Integrated (08/05/2023)   Social Connection and Isolation Panel [NHANES]    Frequency of Communication with Friends and Family: Once a week    Frequency of Social Gatherings with Friends and Family: Once a week    Attends Religious Services: More than 4 times per year    Active Member of [employer] or Organizations: Yes    Attends Engineer, Structural: More than 4 times per year    Marital Status: Married    Review of Systems: Gen: Denies fever, chills, cold or flulike symptoms, presyncope, syncope. GI: See HPI Heme: See HPI  Physical Exam: BP 138/74 (BP Location: Right Arm, Patient Position: Sitting, Cuff Size: Normal)   Pulse 90   Temp 98.2 F (36.8 C) (Temporal)   Ht 4' 10.5 (1.486 m)   Wt 125 lb 12.8 oz (57.1 kg)   BMI 25.84 kg/m  General:   Alert and oriented. No distress noted. Pleasant and cooperative.  Head:  Normocephalic and atraumatic. Eyes:  Conjuctiva clear without scleral icterus. Abdomen:  +BS, soft, non-tender and non-distended. No rebound or guarding. No HSM or masses noted. Msk:  Symmetrical without gross deformities. Normal posture. Extremities:  Without edema. Neurologic:  Alert and  oriented x4 Psych:  Normal mood and affect.    Assessment:  73 y.o. female with history of IDA, dysphagia with Schatzki's ring s/p dilation June 2021 and maintained on daily PPI, constipation, suspected MASH with mildly elevated LFTs and liver fibrosis, presenting today for routine  follow-up.   MASH/liver fibrosis:  Extensive serologies previously including hepatitis A, B, C all negative,smooth muscle antibody, mitochondrial antibody, ANA, immunoglobulins, ceruloplasmin, TTG IgA all negative. No alcohol, significant tylenol , or herbal supplements. Previously with F2/F3 fibrosis in 2017. Most recent ultrasound with elastography March 2024 with hepatic steatosis,  median kPa 6.3.  In September 2024, [name] with F2 fibrosis, ELF elevated at 10.1, ALT 45, AST normalized.  Recommended starting Rezdiffra .  She was approved, but was not able to afford it as it was $95.  Patient was to complete patient assistance with [employer] patient support, but patient states that they asked her to send a copy of her other prescription drug cost and she does not have any other drug cost as she has a $0 co-pay.  Ultimately, patient never got back in contact with the patient support program.  I am not sure if this would preclude her from qualifying for patient assistance.  I have asked that patient reach back out to [facility] patient support to follow-up on this.  She will let me know if we have to resubmit Rezdiffra  Rx/start this process over again.  Clinically, she continues to do well without signs or symptoms of advanced liver disease.  IDA:  S/p EGD and colonoscopy June 2021 with 1 tubular adenoma, small Schatzki's ring s/p dilation, small hiatal hernia, otherwise normal exams. Patient unable to swallow Givens capsule.  Offered endoscopic placement patency capsule followed by endoscopic placement of Givens capsule if appropriate, but patient declined.  Also previously offered CT enterography, but patient also declined due to contrast causing nausea.  She was treated with oral iron. Hemoglobin returned to normal in March 2022 with iron panel within normal limits.  Iron was discontinued at that time.  And hemoglobin has remained within normal limits with last labs in September 2024.   Constipation:   Doing well with MiraLAX daily.  Dysphagia/history of Schatzki's ring:  Maintained on daily PPI.  Reports intermittent pill dysphagia (chronic), but no food or liquid dysphagia.  She is not interested in any further evaluation at this time.   Plan:  Patient is to call [employer] patient support regarding Rezdiffra  coverage. She will let me know if anything is needed from me.  Instructions for fatty liver: Low fat/cholesterol diet.   Avoid sweets, sodas, fruit juices, sweetened beverages like tea, etc. Gradually increase exercise from 15 min daily up to 1 hr per day 5 days/week. Continue Pantoprazole  40 mg daily.  Continue MiraLAX daily.  Follow-up in 6 months.    Josette Centers, PA-C [facility] 10/23/2023

## 2023-10-23 ENCOUNTER — Ambulatory Visit: Payer: Medicare HMO | Admitting: Gastroenterology

## 2023-10-23 ENCOUNTER — Encounter: Payer: Self-pay | Admitting: Gastroenterology

## 2023-10-23 VITALS — BP 138/74 | HR 90 | Temp 98.2°F | Ht 58.5 in | Wt 125.8 lb

## 2023-10-23 DIAGNOSIS — Z860101 Personal history of adenomatous and serrated colon polyps: Secondary | ICD-10-CM | POA: Diagnosis not present

## 2023-10-23 DIAGNOSIS — D509 Iron deficiency anemia, unspecified: Secondary | ICD-10-CM | POA: Diagnosis not present

## 2023-10-23 DIAGNOSIS — R1310 Dysphagia, unspecified: Secondary | ICD-10-CM | POA: Diagnosis not present

## 2023-10-23 DIAGNOSIS — K7581 Nonalcoholic steatohepatitis (NASH): Secondary | ICD-10-CM | POA: Diagnosis not present

## 2023-10-23 DIAGNOSIS — K5900 Constipation, unspecified: Secondary | ICD-10-CM

## 2023-10-23 DIAGNOSIS — K7400 Hepatic fibrosis, unspecified: Secondary | ICD-10-CM | POA: Diagnosis not present

## 2023-10-23 DIAGNOSIS — Z8719 Personal history of other diseases of the digestive system: Secondary | ICD-10-CM | POA: Diagnosis not present

## 2023-10-23 NOTE — Patient Instructions (Addendum)
 Please call [employer] Patient [employer] for Rezdiffra  back about your patient assistance qualification. I am not sure if you can pick up where you left off or if they will need to restart the process with a new prescription.  The number is: [phone].   Otherwise, continue your current medications.  Instructions for fatty liver: Low fat/cholesterol diet.   Avoid sweets, sodas, fruit juices, sweetened beverages like tea, etc. Gradually increase exercise from 15 min daily up to 1 hr per day 5 days/week.   Will plan to see you back in 6 months or sooner if needed.  It was great to see you again today!   Josette Centers, PA-C [facility]

## 2023-12-03 ENCOUNTER — Ambulatory Visit: Payer: Medicare HMO | Admitting: Family Medicine

## 2023-12-03 ENCOUNTER — Telehealth: Payer: Self-pay

## 2023-12-03 ENCOUNTER — Other Ambulatory Visit: Payer: Self-pay | Admitting: Nurse Practitioner

## 2023-12-03 DIAGNOSIS — E1169 Type 2 diabetes mellitus with other specified complication: Secondary | ICD-10-CM

## 2023-12-03 DIAGNOSIS — E039 Hypothyroidism, unspecified: Secondary | ICD-10-CM

## 2023-12-03 DIAGNOSIS — E119 Type 2 diabetes mellitus without complications: Secondary | ICD-10-CM

## 2023-12-03 DIAGNOSIS — I10 Essential (primary) hypertension: Secondary | ICD-10-CM

## 2023-12-03 NOTE — Telephone Encounter (Signed)
Campbell Riches, NP    Labs ordered for her and her husband. Thanks.

## 2023-12-03 NOTE — Telephone Encounter (Signed)
Called and informed pt labs have been ordered

## 2023-12-03 NOTE — Telephone Encounter (Signed)
Copied from CRM [id number]. Topic: Clinical - Request for Lab/Test Order >> Dec 03, 2023  8:58 AM Prudencio Pair wrote: Reason for CRM: Patient called stating that Dr. Gerda Diss usually sends lab orders before they(her & her husband) come for their office visit. She wants to know if he can send the orders over today since her husband has to go to [facility] today & also since there is bad weather coming. Please give her a call to let her know if he will be able to send those orders today. CB #: [phone].

## 2023-12-06 ENCOUNTER — Telehealth: Payer: Self-pay

## 2023-12-06 DIAGNOSIS — E785 Hyperlipidemia, unspecified: Secondary | ICD-10-CM | POA: Diagnosis not present

## 2023-12-06 DIAGNOSIS — E1169 Type 2 diabetes mellitus with other specified complication: Secondary | ICD-10-CM | POA: Diagnosis not present

## 2023-12-06 DIAGNOSIS — E039 Hypothyroidism, unspecified: Secondary | ICD-10-CM | POA: Diagnosis not present

## 2023-12-06 DIAGNOSIS — I10 Essential (primary) hypertension: Secondary | ICD-10-CM | POA: Diagnosis not present

## 2023-12-06 DIAGNOSIS — E119 Type 2 diabetes mellitus without complications: Secondary | ICD-10-CM | POA: Diagnosis not present

## 2023-12-06 NOTE — Telephone Encounter (Signed)
We or the patient should be able to contact [employer] to see if a new Rx is needed, but I wouldn't think so.

## 2023-12-06 NOTE — Telephone Encounter (Signed)
Patient was approved for patient assistance from [employer]. Not sure if medication needs to be resent to pharmacy. Taking over for [facility] and not sure what else needs to be done to get the patient the medication.

## 2023-12-07 LAB — COMPREHENSIVE METABOLIC PANEL
ALT: 65 [IU]/L — ABNORMAL HIGH (ref 0–32)
AST: 59 [IU]/L — ABNORMAL HIGH (ref 0–40)
Albumin: 4.3 g/dL (ref 3.8–4.8)
Alkaline Phosphatase: 62 [IU]/L (ref 44–121)
BUN/Creatinine Ratio: 16 (ref 12–28)
BUN: 14 mg/dL (ref 8–27)
Bilirubin Total: 1 mg/dL (ref 0.0–1.2)
CO2: 25 mmol/L (ref 20–29)
Calcium: 9.5 mg/dL (ref 8.7–10.3)
Chloride: 102 mmol/L (ref 96–106)
Creatinine, Ser: 0.88 mg/dL (ref 0.57–1.00)
Globulin, Total: 2.9 g/dL (ref 1.5–4.5)
Glucose: 119 mg/dL — ABNORMAL HIGH (ref 70–99)
Potassium: 4.5 mmol/L (ref 3.5–5.2)
Sodium: 140 mmol/L (ref 134–144)
Total Protein: 7.2 g/dL (ref 6.0–8.5)
eGFR: 69 mL/min/{1.73_m2} (ref >59)

## 2023-12-07 LAB — LIPID PANEL
Chol/HDL Ratio: 5.3 {ratio} — ABNORMAL HIGH (ref 0.0–4.4)
Cholesterol, Total: 195 mg/dL (ref 100–199)
HDL: 37 mg/dL — ABNORMAL LOW (ref >39)
LDL Chol Calc (NIH): 125 mg/dL — ABNORMAL HIGH (ref 0–99)
Triglycerides: 186 mg/dL — ABNORMAL HIGH (ref 0–149)
VLDL Cholesterol Cal: 33 mg/dL (ref 5–40)

## 2023-12-07 LAB — HEMOGLOBIN A1C
Est. average glucose Bld gHb Est-mCnc: 157 mg/dL
Hgb A1c MFr Bld: 7.1 % — ABNORMAL HIGH (ref 4.8–5.6)

## 2023-12-07 LAB — TSH: TSH: 0.04 u[IU]/mL — ABNORMAL LOW (ref 0.450–4.500)

## 2023-12-09 ENCOUNTER — Ambulatory Visit (INDEPENDENT_AMBULATORY_CARE_PROVIDER_SITE_OTHER): Payer: Medicare HMO | Admitting: Family Medicine

## 2023-12-09 ENCOUNTER — Encounter: Payer: Self-pay | Admitting: Family Medicine

## 2023-12-09 VITALS — BP 111/75 | HR 79 | Temp 97.6°F | Ht 58.5 in | Wt 121.0 lb

## 2023-12-09 DIAGNOSIS — I10 Essential (primary) hypertension: Secondary | ICD-10-CM

## 2023-12-09 DIAGNOSIS — E039 Hypothyroidism, unspecified: Secondary | ICD-10-CM

## 2023-12-09 DIAGNOSIS — Z79899 Other long term (current) drug therapy: Secondary | ICD-10-CM | POA: Diagnosis not present

## 2023-12-09 DIAGNOSIS — M791 Myalgia, unspecified site: Secondary | ICD-10-CM | POA: Diagnosis not present

## 2023-12-09 DIAGNOSIS — E785 Hyperlipidemia, unspecified: Secondary | ICD-10-CM | POA: Diagnosis not present

## 2023-12-09 DIAGNOSIS — K7581 Nonalcoholic steatohepatitis (NASH): Secondary | ICD-10-CM

## 2023-12-09 DIAGNOSIS — E119 Type 2 diabetes mellitus without complications: Secondary | ICD-10-CM

## 2023-12-09 DIAGNOSIS — E1169 Type 2 diabetes mellitus with other specified complication: Secondary | ICD-10-CM

## 2023-12-09 DIAGNOSIS — Z5181 Encounter for therapeutic drug level monitoring: Secondary | ICD-10-CM

## 2023-12-09 DIAGNOSIS — M5431 Sciatica, right side: Secondary | ICD-10-CM

## 2023-12-09 DIAGNOSIS — T466X5A Adverse effect of antihyperlipidemic and antiarteriosclerotic drugs, initial encounter: Secondary | ICD-10-CM

## 2023-12-09 DIAGNOSIS — Z79891 Long term (current) use of opiate analgesic: Secondary | ICD-10-CM

## 2023-12-09 DIAGNOSIS — M542 Cervicalgia: Secondary | ICD-10-CM | POA: Diagnosis not present

## 2023-12-09 MED ORDER — HYDROCODONE-ACETAMINOPHEN 7.5-325 MG PO TABS: ORAL_TABLET | ORAL | 0 refills | Status: DC

## 2023-12-09 MED ORDER — GABAPENTIN 100 MG PO CAPS: ORAL_CAPSULE | ORAL | 5 refills | Status: DC

## 2023-12-09 NOTE — Telephone Encounter (Signed)
 Pt came by and signed forms. Forms were sent to company for review.

## 2023-12-09 NOTE — Telephone Encounter (Signed)
 Spoke to pt and she is going to come by and sign the documents that the company is needing to insure that she continues to receive the medication after 12/28/23

## 2023-12-09 NOTE — Progress Notes (Unsigned)
 Subjective:    Patient ID: Nicole Lam, female    DOB: 18-Sep-1951, 73 y.o.   MRN: [id number]  Discussed the use of AI scribe software for clinical note transcription with the patient, who gave verbal consent to proceed. This patient was seen today for chronic pain  The medication list was reviewed and updated.   Location of Pain for which the patient has been treated with regarding narcotics: Lumbar pain, hip pain, knee pain  Onset of this pain: Present for years   -Compliance with medication: Good compliance  - Number patient states they take daily: Uses very sparingly  -Reason for ongoing use of opioids unable to get total relief with Tylenol or NSAIDs  What other measures have been tried outside of opioids Tylenol, NSAIDs  In the ongoing specialists regarding this condition none currently  -when was the last dose patient took?  Within the past several days  The patient was advised the importance of maintaining medication and not using illegal substances with these.  Here for refills and follow up  The patient was educated that we can provide 3 monthly scripts for their medication, it is their responsibility to follow the instructions.  Side effects or complications from medications: February denies side effects  Patient is aware that pain medications are meant to minimize the severity of the pain to allow their pain levels to improve to allow for better function. They are aware of that pain medications cannot totally remove their pain.  Due for UDT ( at least once per year) (pain management contract is also completed at the time of the UDT): February 2025  Scale of 1 to 10 ( 1 is least 10 is most) Your pain level without the medicine: 7 Your pain level with medication 4  Scale 1 to 10 ( 1-helps very little, 7 helps very well) How well does your pain medication reduce your pain so you can function better through out the day?  10  Quality of the pain: Throbbing  aching  Persistence of the pain: Present off and on  Modifying factors: Worse with activity      History of Present Illness   Nicole Lam is a 73 year old female with elevated liver enzymes and diabetes who presents for follow-up on her liver condition and diabetes management.  She started a new medication on November 29, 2023, prescribed by her gastroenterologist to reduce liver inflammation and elevated liver enzymes. This medication is part of a program to stabilize her liver condition and is obtained through an assistance program due to its high cost.  Her diabetes management has improved, with A1c levels decreasing from 7.8 to 7.1, attributed to dietary changes, specifically reducing sugary drink consumption. She occasionally drinks a small Coca-Cola but primarily consumes Dr. Reino Kent Zero, water, lemon water, or milk.  Her cholesterol levels have improved over the past year, with total cholesterol decreasing from 275 to 195, LDL from 185 to 125, and triglycerides from 296 to 186.  She experiences pain primarily in her shoulders and lower back, sometimes extending to her wrists and fingers. She takes hydrocodone for pain relief, which she finds effective. She also has osteoarthritis in her hands, causing stiffness and requiring her to stretch her fingers to improve mobility.  No swelling in her lower extremities. She reports shortness of breath after physical exertion, such as taking out the trash, which involves walking a considerable distance and climbing steps. She attributes this to possibly walking too fast.  Her sleep is disrupted, often waking up after an hour of sleep and having difficulty returning to sleep until early morning. She takes two gabapentin at night, which sometimes helps her sleep through the night, but not consistently. She typically wakes up around 7:30 AM, regardless of her sleep quality.         Review of Systems     Objective:    Physical Exam    CHEST: Lungs clear to auscultation bilaterally.     General-in no acute distress Eyes-no discharge Lungs-respiratory rate normal, CTA CV-no murmurs,RRR Extremities skin warm dry no edema Neuro grossly normal Behavior normal, alert       Assessment & Plan:  Assessment and Plan    Liver Disease Elevated liver enzymes and inflammation. Patient is on a new medication from gastroenterologist, which is not yet on the market, to reduce inflammation. No current swelling issues. -Continue new medication as prescribed by gastroenterologist. -Seek assistance from [facility] for medication cost coverage.  Type 2 Diabetes Mellitus Improved HbA1c from 7.8 to 7.1. Patient has stopped drinking sugary drinks and occasionally drinks small amounts of Coca Cola, Dr. Reino Kent Zero, water, lemon water, milk, and small amounts of juice. -Continue current management and lifestyle modifications.  Hyperlipidemia Improved total cholesterol from 275 to 195, LDL from 185 to 125, and triglycerides from 296 to 186. -Continue current management.  Chronic Pain Pain in shoulders, back, wrists, and fingers. Patient has one pain pill left. -Continue current pain management.  Sleep Disturbance Difficulty sleeping through the night, sometimes waking up after an hour of sleep and not being able to fall back asleep until early morning. -Continue current sleep management.  General Health Maintenance -Next follow-up in approximately 5-6 months (late July, early August). -Order labs in late July, taking into account any tests ordered by gastroenterologist in early July to avoid redundancy.     1. High risk medication use (Primary) UDT - ToxASSURE Select 13 (MW), Urine  2. Primary hypertension HTN- patient seen for follow-up regarding HTN.   Diet, medication compliance, appropriate labs and refills were completed.   Importance of keeping blood pressure under good control to lessen the risk of  complications discussed Regular follow-up visits discussed   3. Type 2 diabetes mellitus without complication, without long-term current use of insulin (HCC) The patient was seen today as part of a comprehensive visit for diabetes. The importance of keeping her A1c at or below 7 range was discussed.  Discussed diet, activity, and medication compliance Emphasized healthy eating primarily with vegetables fruits and if utilizing meats lean meats such as chicken or fish grilled baked broiled Avoid sugary drinks Minimize and avoid processed foods Fit in regular physical activity preferably 25 to 30 minutes 4 times per week Standard follow-up visit recommended.  Patient aware lack of control and follow-up increases risk of diabetic complications. Regular follow-up visits Yearly ophthalmology Yearly foot exam A1c under decent control at 7.1  4. Hypothyroidism, unspecified type Continue Synthroid check lab work by summer  5. Hyperlipidemia associated with type 2 diabetes mellitus (HCC) Patient cannot tolerate statins has myalgias also has contraindication with elevated liver enzymes  6. [name] (nonalcoholic steatohepatitis) Elevated liver enzymes being treated by gastroenterology  7. Myalgia due to statin Cannot tolerate statins Has contraindication with liver disease Unfortunately her insurance does not do well covering cost of medicines Therefore Repatha Praluent outside of patient's costs  Chronic pain The patient was seen in followup for chronic pain. A review over at  their current pain status was discussed. Drug registry was checked. Prescriptions were given.  Regular follow-up recommended. Discussion was held regarding the importance of compliance with medication as well as pain medication contract.  Patient was informed that medication may cause drowsiness and should not be combined  with other medications/alcohol or street drugs. If the patient feels medication is causing altered  alertness then do not drive or operate dangerous equipment.  Should be noted that the patient appears to be meeting appropriate use of opioids and response.  Evidenced by improved function and decent pain control without significant side effects and no evidence of overt aberrancy issues.  Upon discussion with the patient today they understand that opioid therapy is optional and they feel that the pain has been refractory to reasonable conservative measures and is significant and affecting quality of life enough to warrant ongoing therapy and wishes to continue opioids.  Refills were provided.  [facility] medical Board guidelines regarding the pain medicine has been reviewed.  CDC guidelines most updated 2022 has been reviewed by the prescriber.  PDMP is checked on a regular basis yearly urine drug screen and pain management contract  Treatment plan for this patient includes #1-gentle stretching exercises as shown daily basis 2.  Mild strength exercises 3 times per week #3 continue pain medications #4 notify us if any digression  Will send into [facility] patient to follow-up in near future by summertime

## 2023-12-10 ENCOUNTER — Other Ambulatory Visit: Payer: Self-pay | Admitting: Family Medicine

## 2023-12-10 ENCOUNTER — Telehealth: Payer: Self-pay

## 2023-12-10 LAB — MED LIST OPTION NOT SELECTED

## 2023-12-10 MED ORDER — HYDROCODONE-ACETAMINOPHEN 7.5-325 MG PO TABS: ORAL_TABLET | ORAL | 0 refills | Status: DC

## 2023-12-10 NOTE — Telephone Encounter (Signed)
 Communication  Reason for CRM: patient is calling cause she was in yesterday and Dr Gerda Diss was sending in a refill for the hydrocodone . But pharmacy told patient they dont have the refill for the hydrocodone . I did tell patient that the pharmacy confirmed the they received the refill. But patient says the pharmacy didn't receive that refill

## 2023-12-10 NOTE — Telephone Encounter (Signed)
 You can let the patient know that I sent the prescriptions again today She ought to check with [facility] pharmacy in [facility] thank you

## 2023-12-10 NOTE — Telephone Encounter (Signed)
 Left message to return call to notify of provider's response

## 2023-12-11 LAB — TOXASSURE SELECT 13 (MW), URINE

## 2023-12-11 LAB — SPECIMEN STATUS REPORT

## 2023-12-11 NOTE — Telephone Encounter (Signed)
 Patient notified and stated she was able to get medication

## 2023-12-17 DIAGNOSIS — J00 Acute nasopharyngitis [common cold]: Secondary | ICD-10-CM | POA: Diagnosis not present

## 2023-12-17 DIAGNOSIS — R519 Headache, unspecified: Secondary | ICD-10-CM | POA: Diagnosis not present

## 2023-12-17 DIAGNOSIS — Z20822 Contact with and (suspected) exposure to covid-19: Secondary | ICD-10-CM | POA: Diagnosis not present

## 2023-12-20 ENCOUNTER — Ambulatory Visit: Admitting: Family Medicine

## 2023-12-20 VITALS — BP 116/61 | HR 110 | Ht 58.5 in | Wt 122.0 lb

## 2023-12-20 DIAGNOSIS — J019 Acute sinusitis, unspecified: Secondary | ICD-10-CM

## 2023-12-20 DIAGNOSIS — K7400 Hepatic fibrosis, unspecified: Secondary | ICD-10-CM

## 2023-12-20 MED ORDER — CEFPROZIL 250 MG PO TABS
250.0000 mg | ORAL_TABLET | Freq: Two times a day (BID) | ORAL | 0 refills | Status: DC
Start: 2023-12-20 — End: 2024-02-12

## 2023-12-20 NOTE — Progress Notes (Addendum)
   Subjective:    Patient ID: Nicole Lam, female    DOB: 10-13-1951, 73 y.o.   MRN: [id number]  Discussed the use of AI scribe software for clinical note transcription with the patient, who gave verbal consent to proceed.  History of Present Illness   The patient presents with a dry cough and body aches. Her partner, Genevie Cheshire, is maintaining distance to avoid getting sick.  She has been experiencing a dry cough for the past three to four days. COVID-19 and influenza tests were negative. She was prescribed cough syrup to manage the symptoms. Recently, she started coughing up phlegm, though initially, it was a dry cough. She also experiences occasional shortness of breath.  In addition to the cough, she describes generalized body aches, feeling as though she has been 'beat with a ball mat.' She continues to experience some coughing and reports feeling cold, though she does not believe she has had a fever.  She has a headache that 'used to hurt all over' and a runny nose. No sinus congestion, but she confirms a runny nose.  She experiences a sore throat when coughing and notes her energy levels are notably low, describing having 'no energy.'         Review of Systems     Objective:    Physical Exam   HEENT: Ears normal. Mild sinus tenderness. CHEST: Lungs clear to auscultation, no pneumonia.     Gen-NAD not toxic TMS-normal bilateral T- normal no redness Chest-CTA respiratory rate normal no crackles CV RRR no murmur Skin-warm dry Neuro-grossly normal   no evidence of pneumonia on today's exam     Assessment & Plan:  Assessment and Plan    Flu-like illness with sinusitis Negative COVID-19 and influenza tests. Viral infection with secondary sinusitis. Low pneumonia risk. - Prescribe Cefzil 500 mg orally twice daily for 7 days. - Advise rest and limit activities. - Recommend over-the-counter antitussive. - Instruct to monitor for pneumonia signs: fever >102F, increased  cough, difficulty breathing. - Advise separate sleeping arrangements to prevent transmission. - Send prescription to [facility] pharmacy in [place].     Should be noted that patient has liver disease and cannot take statins She is also had myalgias with statins as well

## 2023-12-31 ENCOUNTER — Other Ambulatory Visit: Payer: Self-pay | Admitting: Family Medicine

## 2023-12-31 DIAGNOSIS — M5431 Sciatica, right side: Secondary | ICD-10-CM

## 2024-01-03 ENCOUNTER — Other Ambulatory Visit: Payer: Self-pay

## 2024-01-03 DIAGNOSIS — M5431 Sciatica, right side: Secondary | ICD-10-CM

## 2024-02-04 DIAGNOSIS — H524 Presbyopia: Secondary | ICD-10-CM | POA: Diagnosis not present

## 2024-02-04 DIAGNOSIS — E119 Type 2 diabetes mellitus without complications: Secondary | ICD-10-CM | POA: Diagnosis not present

## 2024-02-04 DIAGNOSIS — H5203 Hypermetropia, bilateral: Secondary | ICD-10-CM | POA: Diagnosis not present

## 2024-02-04 DIAGNOSIS — H52223 Regular astigmatism, bilateral: Secondary | ICD-10-CM | POA: Diagnosis not present

## 2024-02-04 DIAGNOSIS — H43811 Vitreous degeneration, right eye: Secondary | ICD-10-CM | POA: Diagnosis not present

## 2024-02-04 DIAGNOSIS — Z961 Presence of intraocular lens: Secondary | ICD-10-CM | POA: Diagnosis not present

## 2024-02-12 ENCOUNTER — Ambulatory Visit: Admitting: Obstetrics & Gynecology

## 2024-02-12 ENCOUNTER — Encounter: Payer: Self-pay | Admitting: Obstetrics & Gynecology

## 2024-02-12 VITALS — BP 127/75 | HR 93 | Ht <= 58 in | Wt 117.8 lb

## 2024-02-12 DIAGNOSIS — A609 Anogenital herpesviral infection, unspecified: Secondary | ICD-10-CM | POA: Diagnosis not present

## 2024-02-12 DIAGNOSIS — B009 Herpesviral infection, unspecified: Secondary | ICD-10-CM | POA: Diagnosis not present

## 2024-02-12 DIAGNOSIS — Z79890 Hormone replacement therapy (postmenopausal): Secondary | ICD-10-CM

## 2024-02-12 DIAGNOSIS — Z90710 Acquired absence of both cervix and uterus: Secondary | ICD-10-CM | POA: Diagnosis not present

## 2024-02-12 MED ORDER — ACYCLOVIR 800 MG PO TABS: 800.0000 mg | ORAL_TABLET | Freq: Three times a day (TID) | ORAL | 6 refills | Status: AC

## 2024-02-12 NOTE — Progress Notes (Signed)
 GYN VISIT Patient name: Nicole Lam MRN [id number]  Date of birth: Mar 02, 1951 Chief Complaint:   Medication Refill (Needs acyclovir )  History of Present Illness:   Nicole Lam is a 73 y.o. G2P2001 PM, PH female being seen today for follow up regarding the following:  -HRT: Patient has been on oral Estrace  and this the 90s after her hysterectomy.  She denies any hot flashes night sweats or acute concerns regarding vasomotor symptoms.  At her last visit with her PCP it was recommended that she consider a visit to see if this medication was still medically necessary.  -HSV: Patient notes an occasional outbreak and will take acyclovir  when needed.  Typically she will take this medication about 2 times per year  Denies vaginal bleeding, discharge, itching or irritation.  Denies pelvic or abdominal pain.  She reports no acute GYN concerns  No LMP recorded. Patient has had a hysterectomy.    Review of Systems:   Pertinent items are noted in HPI Denies fever/chills, dizziness, headaches, visual disturbances, fatigue, shortness of breath, chest pain, abdominal pain, vomiting. Pertinent History Reviewed:   Past Surgical History:  Procedure Laterality Date   ABDOMINAL HYSTERECTOMY     AGILE CAPSULE N/A 06/14/2020   Procedure: AGILE CAPSULE;  Surgeon: Suzette Espy, MD; unable to swallow agile   APPENDECTOMY     CATARACT EXTRACTION Bilateral 11/01/2022   right eye , feb 2024 left eye   COLONOSCOPY  05/30/2005   ION:GEXBMW anal papilla, otherwise normal rectum.and colon   COLONOSCOPY N/A 11/04/2015   RMR: normal colonoscopy   COLONOSCOPY WITH PROPOFOL  N/A 04/07/2020   Procedure: COLONOSCOPY WITH PROPOFOL ;  Surgeon: Suzette Espy, MD; One 4 mm polyp at ileocecal valve, otherwise normal exam.  Patient was heme-negative.  Pathology with tubular adenoma.  No recommendations to repeat colonoscopy.   DILATION AND CURETTAGE OF UTERUS     ESOPHAGEAL DILATION  04/07/2020   Procedure:  ESOPHAGEAL DILATION;  Surgeon: Suzette Espy, MD;  [facility];  Service: Endoscopy;;   ESOPHAGOGASTRODUODENOSCOPY (EGD) WITH PROPOFOL  N/A 04/07/2020   Procedure: ESOPHAGOGASTRODUODENOSCOPY (EGD) WITH PROPOFOL ;  Surgeon: Suzette Espy, MD;  Small Schatzki's ring s/p dilated, small hiatal hernia, otherwise normal exam.   POLYPECTOMY  04/07/2020   Procedure: POLYPECTOMY;  Surgeon: Suzette Espy, MD;  Location: [facility];  Service: Endoscopy;;    Past Medical History:  Diagnosis Date   Diabetes (HCC)    Fatty liver    with mild LFT elevation, F2/F3 2017   Hemorrhoid    Hyperlipidemia    Hypertension    Hypothyroidism    Lipoma of neck    Reviewed problem list, medications and allergies. Physical Assessment:   Vitals:   02/12/24 1454  BP: 127/75  Pulse: 93  Weight: 117 lb 12.8 oz (53.4 kg)  Height: 4\' 10"  (1.473 m)  Body mass index is 24.62 kg/m.       Physical Examination:   General appearance: alert, well appearing, and in no distress  Psych: mood appropriate, normal affect  Skin: warm & dry   Cardiovascular: RRR  Respiratory: normal respiratory effort, no distress, CTAB  Abdomen: soft, non-tender   Extremities: no edema   Chaperone: N/A    Assessment & Plan:  1) HRT - At this time based on concern for cardiovascular risk and that she has been asymptomatic for many years okay to discontinue -plan to ween- based on current Rx will have more than enough - Patient to  take every other day then 2 times per week then once weekly then discontinue - Patient to call with any acute concerns or complaints  2) HSV - Plan to continue with acyclovir  as needed - Anticipate that PCP may be able to prescribe this medication for her  - From a GYN standpoint she does not have any acute concerns and has screened out of Pap smears   Meds ordered this encounter  Medications   acyclovir  (ZOVIRAX ) 800 MG tablet    Sig: Take 1 tablet (800 mg total) by mouth 3  (three) times daily for 2 days.    Dispense:  6 tablet    Refill:  6      Return if symptoms worsen or fail to improve.   Kory Rains, DO Attending Obstetrician & Gynecologist, [facility] for [employer], [facility]

## 2024-02-17 ENCOUNTER — Other Ambulatory Visit: Payer: Self-pay | Admitting: Family Medicine

## 2024-02-17 MED ORDER — METFORMIN HCL 500 MG PO TABS
1000.0000 mg | ORAL_TABLET | Freq: Two times a day (BID) | ORAL | 3 refills | Status: AC
Start: 2024-02-17 — End: ?

## 2024-02-25 ENCOUNTER — Other Ambulatory Visit: Payer: Self-pay | Admitting: Family Medicine

## 2024-02-25 NOTE — Telephone Encounter (Unsigned)
 Copied from CRM [id number]. Topic: Clinical - Medication Refill >> Feb 25, 2024  3:13 PM Sophia H wrote: Medication: HYDROcodone -acetaminophen  (NORCO) 7.5-325 MG tablet  Has the patient contacted their pharmacy? Yes (Agent: If no, request that the patient contact the pharmacy for the refill. If patient does not wish to contact the pharmacy document the reason why and proceed with request.) (Agent: If yes, when and what did the pharmacy advise?)  This is the patient's preferred pharmacy:   [facility] [address] [place] - [address] [place] [address] Phone: [phone] Fax: [phone]  Is this the correct pharmacy for this prescription? Yes If no, delete pharmacy and type the correct one.   Has the prescription been filled recently? Yes  Is the patient out of the medication? Yes  Has the patient been seen for an appointment in the last year OR does the patient have an upcoming appointment? Yes  Can we respond through MyChart? No, please call [phone]  Agent: Please be advised that Rx refills may take up to 3 business days. We ask that you follow-up with your pharmacy.

## 2024-02-26 MED ORDER — HYDROCODONE-ACETAMINOPHEN 7.5-325 MG PO TABS: ORAL_TABLET | ORAL | 0 refills | Status: DC

## 2024-03-18 ENCOUNTER — Other Ambulatory Visit: Payer: Self-pay | Admitting: Family Medicine

## 2024-03-20 ENCOUNTER — Ambulatory Visit: Admitting: Physician Assistant

## 2024-03-20 ENCOUNTER — Encounter: Payer: Self-pay | Admitting: Physician Assistant

## 2024-03-20 VITALS — BP 128/64 | HR 98 | Temp 98.1°F | Ht <= 58 in | Wt 118.0 lb

## 2024-03-20 DIAGNOSIS — J069 Acute upper respiratory infection, unspecified: Secondary | ICD-10-CM

## 2024-03-20 DIAGNOSIS — J029 Acute pharyngitis, unspecified: Secondary | ICD-10-CM

## 2024-03-20 DIAGNOSIS — B9689 Other specified bacterial agents as the cause of diseases classified elsewhere: Secondary | ICD-10-CM | POA: Insufficient documentation

## 2024-03-20 LAB — POCT RAPID STREP A (OFFICE): Rapid Strep A Screen: NEGATIVE

## 2024-03-20 MED ORDER — AZITHROMYCIN 250 MG PO TABS: ORAL_TABLET | ORAL | 0 refills | Status: AC

## 2024-03-20 NOTE — Assessment & Plan Note (Signed)
 Patient appears stable today. Benign exam, lungs clear to auscultation bilaterally, no indication for chest XR at this time. Discussed that this fits the picture of viral vs bacterial URI and that due to type and duration of symptoms and exam findings, we will treat as bacterial URI. No evidence of other bacterial infections including pneumonia, pharyngitis, sinusitis, or otitis media. Supportive care reviewed with patient. Tylenol or ibuprofen  for pain as needed. May continue with OTC cold medications. Patient instructed to return to clinic if worsening shortness of breath, chest pain, hypoxia, or other concerns. Patient agreeable to plan.

## 2024-03-20 NOTE — Progress Notes (Signed)
 Acute Office Visit  Subjective:     Patient ID: Nicole Lam, female    DOB: 19-Mar-1951, 73 y.o.   MRN: [id number]   Patient presents today with complaints of cough and congestion x 1 week. Associated symptoms include rhinorrhea, sore throat, and fatigue. She denies fevers, shortness of breath, or difficulty breathing. She has been using OTC cough syrup for symptoms. She denies sick contacts. She is drinking adequate fluids.     Review of Systems  Constitutional:  Positive for malaise/fatigue. Negative for fever.  HENT:  Positive for congestion and sore throat. Negative for ear discharge and ear pain.   Respiratory:  Positive for cough and sputum production. Negative for shortness of breath and wheezing.   Cardiovascular:  Negative for chest pain.  Musculoskeletal:  Negative for myalgias.  Neurological:  Positive for headaches.        Objective:     BP 128/64   Pulse 98   Temp 98.1 F (36.7 C)   Ht 4\' 10"  (1.473 m)   Wt 118 lb (53.5 kg)   SpO2 97%   BMI 24.66 kg/m   Physical Exam Vitals reviewed.  Constitutional:      General: She is not in acute distress.    Appearance: Normal appearance.  HENT:     Right Ear: Tympanic membrane normal.     Left Ear: Tympanic membrane normal.     Nose: Congestion and rhinorrhea present.     Mouth/Throat:     Mouth: Mucous membranes are moist.     Pharynx: Oropharynx is clear. Posterior oropharyngeal erythema present.  Eyes:     Extraocular Movements: Extraocular movements intact.     Conjunctiva/sclera: Conjunctivae normal.  Cardiovascular:     Rate and Rhythm: Normal rate and regular rhythm.     Heart sounds: Normal heart sounds. No murmur heard.    No friction rub.  Pulmonary:     Effort: Pulmonary effort is normal.     Breath sounds: Normal breath sounds. No wheezing, rhonchi or rales.  Lymphadenopathy:     Cervical: No cervical adenopathy.  Skin:    General: Skin is warm and dry.  Neurological:     General: No  focal deficit present.     Mental Status: She is alert and oriented to person, place, and time.  Psychiatric:        Mood and Affect: Mood normal.        Behavior: Behavior normal.     Results for orders placed or performed in visit on 03/20/24  Rapid Strep A  Result Value Ref Range   Rapid Strep A Screen Negative Negative        Assessment & Plan:  Bacterial URI Assessment & Plan: Patient appears stable today. Benign exam, lungs clear to auscultation bilaterally, no indication for chest XR at this time. Discussed that this fits the picture of viral vs bacterial URI and that due to type and duration of symptoms and exam findings, we will treat as bacterial URI. No evidence of other bacterial infections including pneumonia, pharyngitis, sinusitis, or otitis media. Supportive care reviewed with patient. Tylenol  or ibuprofen for pain as needed. May continue with OTC cold medications. Patient instructed to return to clinic if worsening shortness of breath, chest pain, hypoxia, or other concerns. Patient agreeable to plan.    Orders: -     POCT rapid strep A -     Azithromycin ; Take 2 tablets on day 1, then 1 tablet daily on days  2 through 5  Dispense: 6 tablet; Refill: 0  Sore throat -     POCT rapid strep A   Return if symptoms worsen or fail to improve.  Jearlean Mince Roniya Tetro, PA-C

## 2024-03-23 ENCOUNTER — Ambulatory Visit (HOSPITAL_COMMUNITY)
Admission: RE | Admit: 2024-03-23 | Discharge: 2024-03-23 | Disposition: A | Discharge status: Home / Self Care | Source: Ambulatory Visit | Attending: Physician Assistant | Admitting: Physician Assistant

## 2024-03-23 ENCOUNTER — Ambulatory Visit: Payer: Self-pay

## 2024-03-23 ENCOUNTER — Other Ambulatory Visit: Payer: Self-pay | Admitting: Physician Assistant

## 2024-03-23 ENCOUNTER — Other Ambulatory Visit: Payer: Self-pay

## 2024-03-23 ENCOUNTER — Encounter: Payer: Self-pay | Admitting: Physician Assistant

## 2024-03-23 ENCOUNTER — Ambulatory Visit: Admitting: Physician Assistant

## 2024-03-23 ENCOUNTER — Ambulatory Visit: Payer: Self-pay | Admitting: Physician Assistant

## 2024-03-23 VITALS — BP 148/80 | HR 78 | Temp 97.5°F | Ht <= 58 in | Wt 115.0 lb

## 2024-03-23 DIAGNOSIS — R051 Acute cough: Secondary | ICD-10-CM | POA: Insufficient documentation

## 2024-03-23 DIAGNOSIS — R053 Chronic cough: Secondary | ICD-10-CM | POA: Diagnosis not present

## 2024-03-23 DIAGNOSIS — R6889 Other general symptoms and signs: Secondary | ICD-10-CM | POA: Diagnosis not present

## 2024-03-23 MED ORDER — GUAIFENESIN 100 MG/5ML PO SYRP
200.0000 mg | ORAL_SOLUTION | Freq: Three times a day (TID) | ORAL | 0 refills | Status: DC | PRN
Start: 2024-03-23 — End: 2024-05-13

## 2024-03-23 NOTE — Progress Notes (Addendum)
   Acute Office Visit  Subjective:     Patient ID: Nicole Lam, female    DOB: [birth year], 73 y.o.   MRN: [id number]   Patient presents today for concerns for worsening cough and congestion. Patient was seen in office on Friday and treated with azithromycin  for bacterial URI. Today she reports worsening cough and congestion. She has not yet finished current antibiotic. She denies fevers. She relates cough in more persistent and cough is causing some shortness of breath with long coughing spells. She denies chest pain or difficulty breathing.      Review of Systems  Constitutional:  Positive for malaise/fatigue. Negative for fever.  HENT:  Positive for congestion. Negative for ear discharge, ear pain and sore throat.   Respiratory:  Positive for cough, sputum production and shortness of breath. Negative for wheezing.   Cardiovascular:  Positive for chest pain.  Gastrointestinal:  Positive for diarrhea.  Musculoskeletal:  Negative for myalgias.  Neurological:  Positive for headaches.        Objective:     BP (!) 148/80   Pulse 78   Temp (!) 97.5 F (36.4 C)   Ht 4\' 10"  (1.473 m)   Wt 115 lb (52.2 kg)   SpO2 100%   BMI 24.04 kg/m   Physical Exam Constitutional:      Appearance: Normal appearance. She is normal weight. She is ill-appearing.  HENT:     Head: Normocephalic and atraumatic.     Nose: Congestion and rhinorrhea present.     Mouth/Throat:     Mouth: Mucous membranes are moist.     Pharynx: Oropharynx is clear. Posterior oropharyngeal erythema present.  Eyes:     Extraocular Movements: Extraocular movements intact.     Conjunctiva/sclera: Conjunctivae normal.  Cardiovascular:     Rate and Rhythm: Normal rate and regular rhythm.     Heart sounds: Normal heart sounds. No murmur heard.    No gallop.  Pulmonary:     Effort: Pulmonary effort is normal.     Breath sounds: No wheezing, rhonchi or rales.  Neurological:     General: No focal deficit present.      Mental Status: She is alert.     No results found for any visits on 03/23/24.      Assessment & Plan:  Acute cough Assessment & Plan: Patient presents today with continued cough. Lungs clear to auscultation bilaterally. Reassuring exam. Chest x-ray to rule out pneumonia or other infectious causes. Cough suppressant and expectorant given today as patient reports she cannot take Mucinex pills due to the size. Viral swab obtained today. Advised patient to finish antibiotics, however discussed with patient the possibility this is viral in nature and can be persistent despite antibiotic therapy. Reviewed warning signs and return precautions.   Orders: -     guaiFENesin; Take 10 mLs (200 mg total) by mouth 3 (three) times daily as needed for cough.  Dispense: 120 mL; Refill: 0 -     DG Chest 2 View   Return if symptoms worsen or fail to improve.  Jearlean Mince Jovin Fester, PA-C

## 2024-03-23 NOTE — Assessment & Plan Note (Signed)
 Patient presents today with continued cough. Lungs clear to auscultation bilaterally. Reassuring exam. Chest x-ray to rule out pneumonia or other infectious causes. Cough suppressant and expectorant given today as patient reports she cannot take Mucinex pills due to the size. Viral swab obtained today. Advised patient to finish antibiotics, however discussed with patient the possibility this is viral in nature and can be persistent despite antibiotic therapy. Reviewed warning signs and return precautions.

## 2024-03-23 NOTE — Telephone Encounter (Signed)
   FYI Only or Action Required?: FYI only for provider  Patient was last seen in primary care on 12/20/2023 by Bennet Brasil, MD. Called Nurse Triage reporting Cough. Symptoms began a week ago. Interventions attempted: Prescription medications: antibiotic. Symptoms are: unchanged.  Triage Disposition: No disposition on file.  Patient/caregiver understands and will follow disposition?: Copied from CRM [id number]. Topic: Clinical - Red Word Triage >> Mar 23, 2024  8:52 AM Ivette P wrote: Red Word that prompted transfer to Nurse Triage:  in friday, with a cough, sore throat, runny nose. was given antibiotics. cough has gone into chest and cant taste or smell at all. did a strep throat test and was negative.   No wheezing in the chest. Reason for Disposition  SEVERE coughing spells (e.g., whooping sound after coughing, vomiting after coughing)  Answer Assessment - Initial Assessment Questions 1. ONSET: "When did the cough begin?"      Last week, on Friday was seen by PCP; streph is neg and was given antibiotics; states cough is deeper and head is stuffy, cannot taste or smell anything. Covid test at home was old and expired; states it said negative. 2. SEVERITY: "How bad is the cough today?"      moderate 3. SPUTUM: "Describe the color of your sputum" (none, dry cough; clear, white, yellow, green)     yellow 4. HEMOPTYSIS: "Are you coughing up any blood?" If so ask: "How much?" (flecks, streaks, tablespoons, etc.)     denies 5. DIFFICULTY BREATHING: "Are you having difficulty breathing?" If Yes, ask: "How bad is it?" (e.g., mild, moderate, severe)    - MILD: No SOB at rest, mild SOB with walking, speaks normally in sentences, can lie down, no retractions, pulse < 100.    - MODERATE: SOB at rest, SOB with minimal exertion and prefers to sit, cannot lie down flat, speaks in phrases, mild retractions, audible wheezing, pulse 100-120.    - SEVERE: Very SOB at rest, speaks in single words, struggling  to breathe, sitting hunched forward, retractions, pulse > 120      Some, sob, mild 6. FEVER: "Do you have a fever?" If Yes, ask: "What is your temperature, how was it measured, and when did it start?"     denies 7. CARDIAC HISTORY: "Do you have any history of heart disease?" (e.g., heart attack, congestive heart failure)      no 8. LUNG HISTORY: "Do you have any history of lung disease?"  (e.g., pulmonary embolus, asthma, emphysema)     no 9. PE RISK FACTORS: "Do you have a history of blood clots?" (or: recent major surgery, recent prolonged travel, bedridden)     no 10. OTHER SYMPTOMS: "Do you have any other symptoms?" (e.g., runny nose, wheezing, chest pain)       Runny nose 11. PREGNANCY: "Is there any chance you are pregnant?" "When was your last menstrual period?"       no 12. TRAVEL: "Have you traveled out of the country in the last month?" (e.g., travel history, exposures)       no  Protocols used: Cough - Acute Productive-A-AH

## 2024-03-25 ENCOUNTER — Telehealth: Payer: Self-pay | Admitting: *Deleted

## 2024-03-25 NOTE — Telephone Encounter (Signed)
 Copied from CRM [id number]. Topic: Clinical - Lab/Test Results >> Mar 25, 2024  3:02 PM Rosamond Comes wrote: Reason for CRM: patient calling asking for COVID RSV and FLU test results  Patient took last antibiotic pill yesterday 03/24/24  Patient is not getting any better and not any worse Patient would like a call back  [phone]

## 2024-03-26 ENCOUNTER — Telehealth: Payer: Self-pay | Admitting: *Deleted

## 2024-03-26 NOTE — Telephone Encounter (Signed)
 Copied from CRM [id number]. Topic: Clinical - Lab/Test Results >> Mar 25, 2024  3:02 PM Rosamond Comes wrote: Reason for CRM: patient calling asking for COVID RSV and FLU test results  Patient took last antibiotic pill yesterday 03/24/24  Patient is not getting any better and not any worse Patient would like a call back  [phone]

## 2024-03-26 NOTE — Telephone Encounter (Signed)
 See other clinical message

## 2024-03-26 NOTE — Telephone Encounter (Signed)
 Test is pending Unfortunately it is taking a while to get results Please call [facility] to see if they can get results any sooner thank you Please inform patient of this status

## 2024-03-27 ENCOUNTER — Telehealth: Payer: Self-pay

## 2024-03-27 MED ORDER — AMOXICILLIN-POT CLAVULANATE 875-125 MG PO TABS: 1.0000 | ORAL_TABLET | Freq: Two times a day (BID) | ORAL | 0 refills | Status: DC

## 2024-03-27 NOTE — Telephone Encounter (Signed)
 Nicole Lam please call patient at [phone] she is waiting on her test results from the lab and she can not get into mychart to see them.

## 2024-03-27 NOTE — Telephone Encounter (Signed)
 It is possible that this has triggered a sinus infection and is reasonable to do Augmentin  875 twice daily for 7 days take with a snack and a tall glass of water  if any ongoing troubles let us  know

## 2024-03-27 NOTE — Telephone Encounter (Signed)
 Please call patient let her know that it is still pending see how she is doing If she is having a rough time with a lot of discolored phlegm we can send in an antibiotic and then follow-up if any ongoing troubles if she is getting better then more than likely it was a viral illness

## 2024-03-27 NOTE — Telephone Encounter (Signed)
 Patient stated it has settled in her head and she can not smell or taste anything- [facility] in [place]- there phlegm still has slightly yellow color

## 2024-03-27 NOTE — Telephone Encounter (Signed)
Prescription sent electronically to pharmacy. Patient aware. 

## 2024-03-27 NOTE — Telephone Encounter (Signed)
 Duplicate- see other message concerning this

## 2024-03-28 LAB — COVID-19, FLU A+B AND RSV
Influenza A, NAA: NOT DETECTED
Influenza B, NAA: NOT DETECTED
RSV, NAA: NOT DETECTED
SARS-CoV-2, NAA: NOT DETECTED

## 2024-03-28 LAB — SPECIMEN STATUS REPORT

## 2024-03-29 ENCOUNTER — Ambulatory Visit: Payer: Self-pay | Admitting: Physician Assistant

## 2024-04-01 NOTE — Telephone Encounter (Signed)
--   called pt and she wanted to know wether or not she could take a supplement containing mulberry, cinnamon, magnesium citrate, gelatin capsules--please advise  Copied from CRM [id number]. Topic: Clinical - Lab/Test Results >> Apr 01, 2024  1:05 PM Nicole Lam wrote: Reason for CRM: Pt calling in regards to lab results. Relayed negative results to pt, she still has additional questions. Callback number is [phone].

## 2024-04-19 NOTE — Progress Notes (Unsigned)
 Referring Provider: Alphonsa Glendia LABOR, MD Primary Care Physician:  Alphonsa Glendia LABOR, MD Primary GI Physician: Dr. Shaaron  No chief complaint on file.   HPI:   Nicole Lam is a 73 y.o. female  with history of IDA, dysphagia with Schatzki's ring s/p dilation June 2021 and maintained on daily PPI, constipation, suspected MASH with mildly elevated LFTs and metavir F2/F3 in 2017, undergoing yearly US .  Extensive serologies in the past including hepatitis B, hepatitis C, smooth muscle antibody, mitochondrial antibody, ANA, immunoglobulins, ceruloplasmin, TTG IgA all negative.     EGD and colonoscopy June 2021 for IDA with 1 tubular adenoma, small Schatzki's ring s/p dilation, small hiatal hernia, otherwise normal exam. Patient unable to swallow Givens capsule.  Offered endoscopic placement patency capsule followed by endoscopic placement of Givens capsule if appropriate, but patient declined.  Also previously offered CT enterography, but patient also declined due to contrast causing nausea.  She was treated with oral iron with normalization of hemoglobin and iron panel in March 2022.   In September 2024,[name] FibroSure with F2 fibrosis, elf elevated at 10.1,. Recommended Rezdiffra.  Patient was approved for patient assistance from [facility]  in February 2025.    Today:  MASH/liver fibrosis:    IDA:    Constipation:    Dysphagia/history of Schatzki's ring:      Past Medical History:  Diagnosis Date   Diabetes (HCC)    Fatty liver    with mild LFT elevation, F2/F3 2017   Hemorrhoid    Hyperlipidemia    Hypertension    Hypothyroidism    Lipoma of neck     Past Surgical History:  Procedure Laterality Date   ABDOMINAL HYSTERECTOMY     AGILE CAPSULE N/A 06/14/2020   Procedure: AGILE CAPSULE;  Surgeon: Nicole Lamar HERO, MD; unable to swallow agile   APPENDECTOMY     CATARACT EXTRACTION Bilateral 11/01/2022   right eye , feb 2024 left eye   COLONOSCOPY  05/30/2005   MFM:Dpwhoz  anal papilla, otherwise normal rectum.and colon   COLONOSCOPY N/A 11/04/2015   RMR: normal colonoscopy   COLONOSCOPY WITH PROPOFOL  N/A 04/07/2020   Procedure: COLONOSCOPY WITH PROPOFOL ;  Surgeon: Nicole Lamar HERO, MD; One 4 mm polyp at ileocecal valve, otherwise normal exam.  Patient was heme-negative.  Pathology with tubular adenoma.  No recommendations to repeat colonoscopy.   DILATION AND CURETTAGE OF UTERUS     ESOPHAGEAL DILATION  04/07/2020   Procedure: ESOPHAGEAL DILATION;  Surgeon: Nicole Lamar HERO, MD;  [facility]: [facility];  Service: Endoscopy;;   ESOPHAGOGASTRODUODENOSCOPY (EGD) WITH PROPOFOL  N/A 04/07/2020   Procedure: ESOPHAGOGASTRODUODENOSCOPY (EGD) WITH PROPOFOL ;  Surgeon: Nicole Lamar HERO, MD;  Small Schatzki's ring s/p dilated, small hiatal hernia, otherwise normal exam.   POLYPECTOMY  04/07/2020   Procedure: POLYPECTOMY;  Surgeon: Nicole Lamar HERO, MD;  Location: [facility];  Service: Endoscopy;;    Current Outpatient Medications  Medication Sig Dispense Refill   amoxicillin -clavulanate (AUGMENTIN ) 875-125 MG tablet Take 1 tablet by mouth 2 (two) times daily. take with a snack and a tall glass of water  14 tablet 0   Blood Glucose Monitoring Suppl (TRUE METRIX METER) w/Device KIT USE AS DIRECTED 1 kit 0   diclofenac  sodium (VOLTAREN ) 1 % GEL Apply 4 g topically 4 (four) times daily. (Patient taking differently: Apply 4 g topically 2 (two) times daily as needed (pain).) 5 Tube 5   ezetimibe  (ZETIA ) 10 MG tablet TAKE 1 TABLET EVERY DAY 90 tablet 3  gabapentin  (NEURONTIN ) 100 MG capsule TAKE 1 CAPSULE BY MOUTH THREE TIMES DAILY 90 capsule 4   glipiZIDE  (GLUCOTROL  XL) 5 MG 24 hr tablet TAKE 1 TABLET EVERY MORNING AND TAKE 2 TABLETS EVERY EVENING 270 tablet 3   guaifenesin  (ROBITUSSIN) 100 MG/5ML syrup Take 10 mLs (200 mg total) by mouth 3 (three) times daily as needed for cough. 120 mL 0   HYDROcodone -acetaminophen  (NORCO) 7.5-325 MG tablet 1 twice daily as needed use sparingly  30 tablet 0   HYDROcodone -acetaminophen  (NORCO) 7.5-325 MG tablet 1 q4 hours prn 30 tablet 0   ibuprofen (ADVIL) 400 MG tablet Take 400 mg by mouth every 6 (six) hours as needed. As needed for pain     levothyroxine  (SYNTHROID ) 88 MCG tablet TAKE 1/2 TABLET ON MONDAY AND FRIDAYS AND TAKE 1 WHOLE TABLET ALL OTHER DAYS 78 tablet 3   losartan  (COZAAR ) 50 MG tablet TAKE 1 TABLET EVERY DAY 90 tablet 3   metFORMIN  (GLUCOPHAGE ) 500 MG tablet Take 2 tablets (1,000 mg total) by mouth 2 (two) times daily. 360 tablet 3   Multiple Vitamins-Minerals (MULTIVITAMIN WITH MINERALS) tablet Take 1 tablet by mouth daily.     pantoprazole  (PROTONIX ) 40 MG tablet TAKE 1 TABLET EVERY DAY 90 tablet 3   phenylephrine -shark liver oil-mineral oil-petrolatum (PREPARATION H) 0.25-3-14-71.9 % rectal ointment Place 1 application rectally as needed for hemorrhoids.     polyethylene glycol (MIRALAX / GLYCOLAX) packet Take 17 g by mouth daily.      REZDIFFRA 80 MG TABS Take 1 tablet by mouth daily.     TRUE METRIX BLOOD GLUCOSE TEST test strip TEST BLOOD SUGAR EVERY DAY 100 strip 0   TRUEplus Lancets 33G MISC TEST ONE TO TWO TIMES DAILY AS NEEDED DUE TO FLUCTUATING GLUCOSE 200 each 0   No current facility-administered medications for this visit.    Allergies as of 04/22/2024 - Review Complete 03/23/2024  Allergen Reaction Noted   Atorvastatin   11/11/2021    Family History  Problem Relation Age of Onset   Heart disease Mother    Hypertension Mother    Birth defects Daughter    Colon cancer Neg Hx    Liver disease Neg Hx    Colon polyps Neg Hx     Social History   Socioeconomic History   Marital status: Married    Spouse name: Sherida   Number of children: 4   Years of education: Not on file   Highest education level: 12th grade  Occupational History   Not on file  Tobacco Use   Smoking status: Never   Smokeless tobacco: Never  Substance and Sexual Activity   Alcohol use: No    Alcohol/week: 0.0 standard  drinks of alcohol   Drug use: No   Sexual activity: Yes    Birth control/protection: Surgical    Comment: hyst  Other Topics Concern   Not on file  Social History Narrative   Total of 4 kids between pt and husband. 3 living.    Married x 28 years.    Social Drivers of Health   Financial Resource Strain: Patient Declined (12/05/2023)   Overall Financial Resource Strain (CARDIA)    Difficulty of Paying Living Expenses: Patient declined  Food Insecurity: Patient Declined (12/05/2023)   Hunger Vital Sign    Worried About Running Out of Food in the Last Year: Patient declined    Ran Out of Food in the Last Year: Patient declined  Transportation Needs: No Transportation Needs (12/05/2023)   PRAPARE -  Administrator, Civil Service (Medical): No    Lack of Transportation (Non-Medical): No  Physical Activity: Unknown (12/05/2023)   Exercise Vital Sign    Days of Exercise per Week: Patient declined    Minutes of Exercise per Session: 30 min  Stress: No Stress Concern Present (12/05/2023)   [employer] of Occupational Health - Occupational Stress Questionnaire    Feeling of Stress : Only a little  Social Connections: Socially Integrated (12/05/2023)   Social Connection and Isolation Panel    Frequency of Communication with Friends and Family: Twice a week    Frequency of Social Gatherings with Friends and Family: Once a week    Attends Religious Services: More than 4 times per year    Active Member of [employer] or Organizations: Yes    Attends Engineer, structural: More than 4 times per year    Marital Status: Married    Review of Systems: Gen: Denies fever, chills, anorexia. Denies fatigue, weakness, weight loss.  CV: Denies chest pain, palpitations, syncope, peripheral edema, and claudication. Resp: Denies dyspnea at rest, cough, wheezing, coughing up blood, and pleurisy. GI: Denies vomiting blood, jaundice, and fecal incontinence.   Denies dysphagia or  odynophagia. Derm: Denies rash, itching, dry skin Psych: Denies depression, anxiety, memory loss, confusion. No homicidal or suicidal ideation.  Heme: Denies bruising, bleeding, and enlarged lymph nodes.  Physical Exam: There were no vitals taken for this visit. General:   Alert and oriented. No distress noted. Pleasant and cooperative.  Head:  Normocephalic and atraumatic. Eyes:  Conjuctiva clear without scleral icterus. Heart:  S1, S2 present without murmurs appreciated. Lungs:  Clear to auscultation bilaterally. No wheezes, rales, or rhonchi. No distress.  Abdomen:  +BS, soft, non-tender and non-distended. No rebound or guarding. No HSM or masses noted. Msk:  Symmetrical without gross deformities. Normal posture. Extremities:  Without edema. Neurologic:  Alert and  oriented x4 Psych:  Normal mood and affect.    Assessment:     Plan:  ***   Josette Centers, PA-C [facility] 04/22/2024

## 2024-04-21 ENCOUNTER — Other Ambulatory Visit: Payer: Self-pay | Admitting: Family Medicine

## 2024-04-22 ENCOUNTER — Ambulatory Visit (INDEPENDENT_AMBULATORY_CARE_PROVIDER_SITE_OTHER): Payer: Medicare HMO | Admitting: Gastroenterology

## 2024-04-22 ENCOUNTER — Encounter: Payer: Self-pay | Admitting: Gastroenterology

## 2024-04-22 VITALS — BP 125/69 | HR 84 | Temp 98.3°F | Ht 60.0 in | Wt 117.6 lb

## 2024-04-22 DIAGNOSIS — D509 Iron deficiency anemia, unspecified: Secondary | ICD-10-CM

## 2024-04-22 DIAGNOSIS — Z8719 Personal history of other diseases of the digestive system: Secondary | ICD-10-CM

## 2024-04-22 DIAGNOSIS — K5900 Constipation, unspecified: Secondary | ICD-10-CM | POA: Diagnosis not present

## 2024-04-22 DIAGNOSIS — K7581 Nonalcoholic steatohepatitis (NASH): Secondary | ICD-10-CM

## 2024-04-22 DIAGNOSIS — K7400 Hepatic fibrosis, unspecified: Secondary | ICD-10-CM

## 2024-04-22 DIAGNOSIS — Z860101 Personal history of adenomatous and serrated colon polyps: Secondary | ICD-10-CM | POA: Diagnosis not present

## 2024-04-22 DIAGNOSIS — R1310 Dysphagia, unspecified: Secondary | ICD-10-CM

## 2024-04-22 NOTE — Patient Instructions (Signed)
 We will arrange for you to have blood work in August.  Continue Rezdiffra daily.  Continue MiraLAX as needed for constipation.  I will see back in 6 months or sooner if needed.  Josette Centers, PA-C [facility]

## 2024-04-26 ENCOUNTER — Telehealth: Payer: Self-pay | Admitting: Family Medicine

## 2024-04-26 NOTE — Telephone Encounter (Signed)
 Please order lipid, liver, metabolic 7, A1c, TSH free T4, urine ACR Diabetes, hyperlipidemia, elevated liver enzymes, high risk medication, hypertension  Please have patient do these labs she has an appointment at the end of the month

## 2024-04-27 ENCOUNTER — Other Ambulatory Visit: Payer: Self-pay | Admitting: Family Medicine

## 2024-04-27 ENCOUNTER — Other Ambulatory Visit: Payer: Self-pay

## 2024-04-27 DIAGNOSIS — E039 Hypothyroidism, unspecified: Secondary | ICD-10-CM

## 2024-04-27 DIAGNOSIS — E1169 Type 2 diabetes mellitus with other specified complication: Secondary | ICD-10-CM

## 2024-04-27 DIAGNOSIS — I10 Essential (primary) hypertension: Secondary | ICD-10-CM

## 2024-04-27 DIAGNOSIS — E119 Type 2 diabetes mellitus without complications: Secondary | ICD-10-CM

## 2024-04-27 DIAGNOSIS — Z79899 Other long term (current) drug therapy: Secondary | ICD-10-CM

## 2024-04-27 NOTE — Telephone Encounter (Signed)
 Pt has been made aware of her lab orders per provider.

## 2024-05-04 ENCOUNTER — Other Ambulatory Visit: Payer: Self-pay | Admitting: Gastroenterology

## 2024-05-04 DIAGNOSIS — K7581 Nonalcoholic steatohepatitis (NASH): Secondary | ICD-10-CM

## 2024-05-04 MED ORDER — REZDIFFRA 80 MG PO TABS: 1.0000 | ORAL_TABLET | Freq: Every day | ORAL | 2 refills | Status: AC

## 2024-05-08 DIAGNOSIS — E039 Hypothyroidism, unspecified: Secondary | ICD-10-CM | POA: Diagnosis not present

## 2024-05-08 DIAGNOSIS — Z79899 Other long term (current) drug therapy: Secondary | ICD-10-CM | POA: Diagnosis not present

## 2024-05-08 DIAGNOSIS — I10 Essential (primary) hypertension: Secondary | ICD-10-CM | POA: Diagnosis not present

## 2024-05-08 DIAGNOSIS — E785 Hyperlipidemia, unspecified: Secondary | ICD-10-CM | POA: Diagnosis not present

## 2024-05-08 DIAGNOSIS — E1169 Type 2 diabetes mellitus with other specified complication: Secondary | ICD-10-CM | POA: Diagnosis not present

## 2024-05-08 DIAGNOSIS — E119 Type 2 diabetes mellitus without complications: Secondary | ICD-10-CM | POA: Diagnosis not present

## 2024-05-09 ENCOUNTER — Ambulatory Visit: Payer: Self-pay | Admitting: Family Medicine

## 2024-05-09 LAB — HEPATIC FUNCTION PANEL
ALT: 41 IU/L — ABNORMAL HIGH (ref 0–32)
AST: 33 IU/L (ref 0–40)
Albumin: 4.7 g/dL (ref 3.8–4.8)
Alkaline Phosphatase: 79 IU/L (ref 44–121)
Bilirubin Total: 1 mg/dL (ref 0.0–1.2)
Bilirubin, Direct: 0.27 mg/dL (ref 0.00–0.40)
Total Protein: 7.6 g/dL (ref 6.0–8.5)

## 2024-05-09 LAB — TSH+FREE T4
Free T4: 1.35 ng/dL (ref 0.82–1.77)
TSH: 0.005 u[IU]/mL — ABNORMAL LOW (ref 0.450–4.500)

## 2024-05-09 LAB — BASIC METABOLIC PANEL WITH GFR
BUN/Creatinine Ratio: 14 (ref 12–28)
BUN: 12 mg/dL (ref 8–27)
CO2: 21 mmol/L (ref 20–29)
Calcium: 10.3 mg/dL (ref 8.7–10.3)
Chloride: 101 mmol/L (ref 96–106)
Creatinine, Ser: 0.88 mg/dL (ref 0.57–1.00)
Glucose: 150 mg/dL — ABNORMAL HIGH (ref 70–99)
Potassium: 4.9 mmol/L (ref 3.5–5.2)
Sodium: 139 mmol/L (ref 134–144)
eGFR: 69 mL/min/1.73 (ref >59)

## 2024-05-09 LAB — LIPID PANEL
Chol/HDL Ratio: 4.2 ratio (ref 0.0–4.4)
Cholesterol, Total: 158 mg/dL (ref 100–199)
HDL: 38 mg/dL — ABNORMAL LOW (ref >39)
LDL Chol Calc (NIH): 92 mg/dL (ref 0–99)
Triglycerides: 157 mg/dL — ABNORMAL HIGH (ref 0–149)
VLDL Cholesterol Cal: 28 mg/dL (ref 5–40)

## 2024-05-09 LAB — HEMOGLOBIN A1C
Est. average glucose Bld gHb Est-mCnc: 169 mg/dL
Hgb A1c MFr Bld: 7.5 % — ABNORMAL HIGH (ref 4.8–5.6)

## 2024-05-13 ENCOUNTER — Ambulatory Visit: Payer: Medicare HMO | Admitting: Family Medicine

## 2024-05-13 ENCOUNTER — Encounter: Payer: Self-pay | Admitting: Family Medicine

## 2024-05-13 VITALS — BP 136/66 | HR 100 | Temp 98.4°F | Ht 60.0 in | Wt 116.0 lb

## 2024-05-13 DIAGNOSIS — Z79899 Other long term (current) drug therapy: Secondary | ICD-10-CM

## 2024-05-13 DIAGNOSIS — I10 Essential (primary) hypertension: Secondary | ICD-10-CM

## 2024-05-13 DIAGNOSIS — E785 Hyperlipidemia, unspecified: Secondary | ICD-10-CM

## 2024-05-13 DIAGNOSIS — F4321 Adjustment disorder with depressed mood: Secondary | ICD-10-CM

## 2024-05-13 DIAGNOSIS — E1169 Type 2 diabetes mellitus with other specified complication: Secondary | ICD-10-CM | POA: Diagnosis not present

## 2024-05-13 DIAGNOSIS — E039 Hypothyroidism, unspecified: Secondary | ICD-10-CM | POA: Diagnosis not present

## 2024-05-13 DIAGNOSIS — E119 Type 2 diabetes mellitus without complications: Secondary | ICD-10-CM

## 2024-05-13 DIAGNOSIS — Z789 Other specified health status: Secondary | ICD-10-CM

## 2024-05-13 MED ORDER — HYDROCODONE-ACETAMINOPHEN 7.5-325 MG PO TABS: ORAL_TABLET | ORAL | 0 refills | Status: DC

## 2024-05-13 MED ORDER — LEVOTHYROXINE SODIUM 88 MCG PO TABS: ORAL_TABLET | ORAL | 3 refills | Status: DC

## 2024-05-13 NOTE — Progress Notes (Signed)
 Subjective:    Patient ID: Nicole Lam, female    DOB: 1951/04/22, 73 y.o.   MRN: [id number]  HPI This patient was seen today for chronic pain  The medication list was reviewed and updated.   Location of Pain for which the patient has been treated with regarding narcotics: shoulders, back neck and knees  Onset of this pain: Present for years   -Compliance with medication: Good compliance  - Number patient states they take daily: 1/2 tab 2 time week  -Reason for ongoing use of opioids shoulder, back, neck and knee pain    What other measures have been tried outside of opioids anti inflammatory, pain cream  In the ongoing specialists regarding this condition none currently  -when was the last dose patient took? 2 weeks ago  The patient was advised the importance of maintaining medication and not using illegal substances with these.  Here for refills and follow up  The patient was educated that we can provide 3 monthly scripts for their medication, it is their responsibility to follow the instructions.  Side effects or complications from medications: Denies side effects  Patient is aware that pain medications are meant to minimize the severity of the pain to allow their pain levels to improve to allow for better function. They are aware of that pain medications cannot totally remove their pain.  Due for UDT ( at least once per year) (pain management contract is also completed at the time of the UDT): Uses sparingly therefore, not doing urine drug screen  Scale of 1 to 10 ( 1 is least 10 is most) Your pain level without the medicine: 7 Your pain level with medication 3  Scale 1 to 10 ( 1-helps very little, 10 helps very well) How well does your pain medication reduce your pain so you can function better through out the day? 7-8  Quality of the pain: Throbbing  Persistence of the pain: Intermittent  Modifying factors: Worse with activity  The patient was seen today  as part of a comprehensive diabetic check up. Patient has diabetes Patient relates good compliance with taking the medication. We discussed their diet and exercise activities  We also discussed the importance of notifying us  if any excessively high glucoses or low sugars.    Patient here for follow-up regarding cholesterol.    Patient relates taking medication on a regular basis Statins Importance of dietary measures discussed Regular lab work regarding lipid and liver was checked and if needing additional labs was appropriately ordered Does not tolerate  Results for orders placed or performed in visit on 04/27/24  Lipid Panel   Collection Time: 05/08/24 10:54 AM  Result Value Ref Range   Cholesterol, Total 158 100 - 199 mg/dL   Triglycerides 842 (H) 0 - 149 mg/dL   HDL 38 (L) >60 mg/dL   VLDL Cholesterol Cal 28 5 - 40 mg/dL   LDL Chol Calc (NIH) 92 0 - 99 mg/dL   Chol/HDL Ratio 4.2 0.0 - 4.4 ratio  Hepatic Function Panel   Collection Time: 05/08/24 10:54 AM  Result Value Ref Range   Total Protein 7.6 6.0 - 8.5 g/dL   Albumin 4.7 3.8 - 4.8 g/dL   Bilirubin Total 1.0 0.0 - 1.2 mg/dL   Bilirubin, Direct 9.72 0.00 - 0.40 mg/dL   Alkaline Phosphatase 79 44 - 121 IU/L   AST 33 0 - 40 IU/L   ALT 41 (H) 0 - 32 IU/L  Basic Metabolic Panel  Collection Time: 05/08/24 10:54 AM  Result Value Ref Range   Glucose 150 (H) 70 - 99 mg/dL   BUN 12 8 - 27 mg/dL   Creatinine, Ser 9.11 0.57 - 1.00 mg/dL   eGFR 69 >40 fO/fpw/8.26   BUN/Creatinine Ratio 14 12 - 28   Sodium 139 134 - 144 mmol/L   Potassium 4.9 3.5 - 5.2 mmol/L   Chloride 101 96 - 106 mmol/L   CO2 21 20 - 29 mmol/L   Calcium  10.3 8.7 - 10.3 mg/dL  Hemoglobin J8r   Collection Time: 05/08/24 10:54 AM  Result Value Ref Range   Hgb A1c MFr Bld 7.5 (H) 4.8 - 5.6 %   Est. average glucose Bld gHb Est-mCnc 169 mg/dL  TSH + free T4   Collection Time: 05/08/24 10:54 AM  Result Value Ref Range   TSH 0.005 (L) 0.450 - 4.500  uIU/mL   Free T4 1.35 0.82 - 1.77 ng/dL         Review of Systems     Objective:   Physical Exam General-in no acute distress Eyes-no discharge Lungs-respiratory rate normal, CTA CV-no murmurs,RRR Extremities skin warm dry no edema Neuro grossly normal Behavior normal, alert        Assessment & Plan:  1. Primary hypertension (Primary) HTN- patient seen for follow-up regarding HTN.   Diet, medication compliance, appropriate labs and refills were completed.   Importance of keeping blood pressure under good control to lessen the risk of complications discussed Regular follow-up visits discussed   2. Type 2 diabetes mellitus without complication, without long-term current use of insulin (HCC) The patient was seen today as part of a comprehensive visit for diabetes. The importance of keeping her A1c at or below 7 range was discussed.  Discussed diet, activity, and medication compliance Emphasized healthy eating primarily with vegetables fruits and if utilizing meats lean meats such as chicken or fish grilled baked broiled Avoid sugary drinks Minimize and avoid processed foods Fit in regular physical activity preferably 25 to 30 minutes 4 times per week Standard follow-up visit recommended.  Patient aware lack of control and follow-up increases risk of diabetic complications. Regular follow-up visits Yearly ophthalmology Yearly foot exam Patient to work harder on minimizing carbohydrates include more proteins in diet - Microalbumin / creatinine urine ratio - Basic metabolic panel with GFR - Hemoglobin A1c  3. Hyperlipidemia associated with type 2 diabetes mellitus (HCC) Does not tolerate statins follow-up labs again in 6 months consider Repatha but currently patient cannot afford co-pays, continue ezetimibe  daily - Lipid panel  4. Hypothyroidism, unspecified type Adjust medication recommend half tablet 3 days/week May take 1 tablet on all other days relook at labs again  before next visit - TSH + free T4  5. High risk medication use Labs before next visit slight elevation of liver enzyme but it is much better than what it was - Hepatic function panel  6. Grief Dealing with the loss of her husband she is working through this but may or may not consider counseling she will let us  know she denies being depressed just stressed  7. Statin intolerance Did not tolerate stati follow-up approximately 6 months sooner problems ns

## 2024-05-27 ENCOUNTER — Other Ambulatory Visit: Payer: Self-pay | Admitting: *Deleted

## 2024-05-27 DIAGNOSIS — K7400 Hepatic fibrosis, unspecified: Secondary | ICD-10-CM

## 2024-05-27 DIAGNOSIS — K7581 Nonalcoholic steatohepatitis (NASH): Secondary | ICD-10-CM

## 2024-05-27 DIAGNOSIS — D509 Iron deficiency anemia, unspecified: Secondary | ICD-10-CM

## 2024-06-02 DIAGNOSIS — D509 Iron deficiency anemia, unspecified: Secondary | ICD-10-CM | POA: Diagnosis not present

## 2024-06-02 DIAGNOSIS — K7400 Hepatic fibrosis, unspecified: Secondary | ICD-10-CM | POA: Diagnosis not present

## 2024-06-02 DIAGNOSIS — K7581 Nonalcoholic steatohepatitis (NASH): Secondary | ICD-10-CM | POA: Diagnosis not present

## 2024-06-03 ENCOUNTER — Ambulatory Visit: Payer: Self-pay | Admitting: Gastroenterology

## 2024-06-03 LAB — CBC WITH DIFFERENTIAL/PLATELET
Basophils Absolute: 0.1 x10E3/uL (ref 0.0–0.2)
Basos: 1 %
EOS (ABSOLUTE): 0.3 x10E3/uL (ref 0.0–0.4)
Eos: 5 %
Hematocrit: 37.9 % (ref 34.0–46.6)
Hemoglobin: 12 g/dL (ref 11.1–15.9)
Immature Grans (Abs): 0 x10E3/uL (ref 0.0–0.1)
Immature Granulocytes: 0 %
Lymphocytes Absolute: 2.3 x10E3/uL (ref 0.7–3.1)
Lymphs: 46 %
MCH: 29.1 pg (ref 26.6–33.0)
MCHC: 31.7 g/dL (ref 31.5–35.7)
MCV: 92 fL (ref 79–97)
Monocytes Absolute: 0.3 x10E3/uL (ref 0.1–0.9)
Monocytes: 6 %
Neutrophils Absolute: 2.1 x10E3/uL (ref 1.4–7.0)
Neutrophils: 42 %
Platelets: 293 x10E3/uL (ref 150–450)
RBC: 4.13 x10E6/uL (ref 3.77–5.28)
RDW: 13.5 % (ref 11.7–15.4)
WBC: 5 x10E3/uL (ref 3.4–10.8)

## 2024-06-03 LAB — HEPATIC FUNCTION PANEL
ALT: 30 IU/L (ref 0–32)
AST: 31 IU/L (ref 0–40)
Albumin: 4.3 g/dL (ref 3.8–4.8)
Alkaline Phosphatase: 63 IU/L (ref 44–121)
Bilirubin Total: 0.7 mg/dL (ref 0.0–1.2)
Bilirubin, Direct: 0.23 mg/dL (ref 0.00–0.40)
Total Protein: 7.1 g/dL (ref 6.0–8.5)

## 2024-06-04 LAB — NASH FIBROSURE(R) PLUS
ALPHA 2-MACROGLOBULINS, QN: 272 mg/dL (ref 110–276)
ALT (SGPT) P5P: 33 IU/L (ref 0–40)
AST (SGOT) P5P: 32 IU/L (ref 0–40)
Apolipoprotein A-1: 107 mg/dL — ABNORMAL LOW (ref 116–209)
Bilirubin, Total: 0.5 mg/dL (ref 0.0–1.2)
Cholesterol, Total: 150 mg/dL (ref 100–199)
Fibrosis Score: 0.45 — ABNORMAL HIGH (ref 0.00–0.21)
GGT: 16 IU/L (ref 0–60)
Glucose: 164 mg/dL — ABNORMAL HIGH (ref 70–99)
Haptoglobin: 157 mg/dL (ref 42–346)
NASH Score: 0.74 — ABNORMAL HIGH (ref 0.00–0.25)
Steatosis Score: 0.77 — ABNORMAL HIGH (ref 0.00–0.40)
Triglycerides: 179 mg/dL — ABNORMAL HIGH (ref 0–149)

## 2024-06-04 LAB — ENHANCED LIVER FIBROSIS (ELF): ELF(TM) Score: 10.14 — ABNORMAL HIGH (ref <9.80)

## 2024-07-22 ENCOUNTER — Other Ambulatory Visit: Payer: Self-pay | Admitting: Family Medicine

## 2024-07-24 ENCOUNTER — Ambulatory Visit: Payer: Medicare HMO

## 2024-07-24 VITALS — Ht 60.0 in | Wt 116.0 lb

## 2024-07-24 DIAGNOSIS — Z0000 Encounter for general adult medical examination without abnormal findings: Secondary | ICD-10-CM

## 2024-07-24 NOTE — Progress Notes (Signed)
 Subjective:   Nicole Lam is a 73 y.o. who presents for a Medicare Wellness preventive visit.  As a reminder, Annual Wellness Visits don't include a physical exam, and some assessments may be limited, especially if this visit is performed virtually. We may recommend an in-person follow-up visit with your provider if needed.  Visit Complete: Virtual I connected with  Priya W Wimbush on 07/24/24 by a audio enabled telemedicine application and verified that I am speaking with the correct person using two identifiers.  Patient Location: Home  Provider Location: Home Office  I discussed the limitations of evaluation and management by telemedicine. The patient expressed understanding and agreed to proceed.  Vital Signs: Because this visit was a virtual/telehealth visit, some criteria may be missing or patient reported. Any vitals not documented were not able to be obtained and vitals that have been documented are patient reported.  VideoDeclined- This patient declined Librarian, academic. Therefore the visit was completed with audio only.  Persons Participating in Visit: Patient.  AWV Questionnaire: No: Patient Medicare AWV questionnaire was not completed prior to this visit.  Cardiac Risk Factors include: advanced age (>45men, >82 women);diabetes mellitus;dyslipidemia;hypertension     Objective:    Today's Vitals   07/24/24 1119  Weight: 116 lb (52.6 kg)  Height: 5' (1.524 m)   Body mass index is 22.65 kg/m.     07/24/2024   11:26 AM 07/19/2023   10:13 AM 07/10/2022    3:08 PM 07/04/2021    3:59 PM 04/07/2020    8:37 AM 04/04/2020    1:08 PM 07/15/2017    8:39 AM  Advanced Directives  Does Patient Have a Medical Advance Directive? No No No No No No No   Would patient like information on creating a medical advance directive? Yes (MAU/Ambulatory/Procedural Areas - Information given) Yes (MAU/Ambulatory/Procedural Areas - Information given) No -  Patient declined No - Patient declined No - Patient declined No - Patient declined No - Patient declined      Data saved with a previous flowsheet row definition    Current Medications (verified) Outpatient Encounter Medications as of 07/24/2024  Medication Sig   Blood Glucose Monitoring Suppl (TRUE METRIX METER) w/Device KIT USE AS DIRECTED   diclofenac  sodium (VOLTAREN ) 1 % GEL Apply 4 g topically 4 (four) times daily.   ezetimibe  (ZETIA ) 10 MG tablet TAKE 1 TABLET EVERY DAY   gabapentin  (NEURONTIN ) 100 MG capsule TAKE 1 CAPSULE BY MOUTH THREE TIMES DAILY   glipiZIDE  (GLUCOTROL  XL) 5 MG 24 hr tablet TAKE 1 TABLET EVERY MORNING AND TAKE 2 TABLETS EVERY EVENING   HYDROcodone -acetaminophen  (NORCO) 7.5-325 MG tablet 1 q4 hours prn   ibuprofen (ADVIL) 400 MG tablet Take 400 mg by mouth every 6 (six) hours as needed. As needed for pain   levothyroxine  (SYNTHROID ) 88 MCG tablet TAKE 1/2 TABLET ON MONDAY, WEDNESDAYS AND FRIDAYS AND TAKE 1 WHOLE TABLET ALL OTHER DAYS   losartan  (COZAAR ) 50 MG tablet TAKE 1 TABLET EVERY DAY   metFORMIN  (GLUCOPHAGE ) 500 MG tablet Take 2 tablets (1,000 mg total) by mouth 2 (two) times daily.   Multiple Vitamins-Minerals (MULTIVITAMIN WITH MINERALS) tablet Take 1 tablet by mouth daily.   pantoprazole  (PROTONIX ) 40 MG tablet TAKE 1 TABLET EVERY DAY   phenylephrine -shark liver oil-mineral oil-petrolatum (PREPARATION H) 0.25-3-14-71.9 % rectal ointment Place 1 application rectally as needed for hemorrhoids.   polyethylene glycol (MIRALAX / GLYCOLAX) packet Take 17 g by mouth daily.    REZDIFFRA   80 MG TABS Take 1 tablet by mouth daily.   TRUE METRIX BLOOD GLUCOSE TEST test strip TEST BLOOD SUGAR EVERY DAY   TRUEplus Lancets 33G MISC TEST ONE TO TWO TIMES DAILY AS NEEDED DUE TO FLUCTUATING GLUCOSE   [DISCONTINUED] ezetimibe  (ZETIA ) 10 MG tablet TAKE 1 TABLET EVERY DAY   No facility-administered encounter medications on file as of 07/24/2024.    Allergies  (verified) Atorvastatin    History: Past Medical History:  Diagnosis Date   Diabetes (HCC)    Fatty liver    with mild LFT elevation, F2/F3 2017   Hemorrhoid    Hyperlipidemia    Hypertension    Hypothyroidism    Lipoma of neck    Past Surgical History:  Procedure Laterality Date   ABDOMINAL HYSTERECTOMY     AGILE CAPSULE N/A 06/14/2020   Procedure: AGILE CAPSULE;  Surgeon: Shaaron Lamar HERO, MD; unable to swallow agile   APPENDECTOMY     CATARACT EXTRACTION Bilateral 11/01/2022   right eye , feb 2024 left eye   COLONOSCOPY  05/30/2005   MFM:Dpwhoz anal papilla, otherwise normal rectum.and colon   COLONOSCOPY N/A 11/04/2015   RMR: normal colonoscopy   COLONOSCOPY WITH PROPOFOL  N/A 04/07/2020   Procedure: COLONOSCOPY WITH PROPOFOL ;  Surgeon: Shaaron Lamar HERO, MD; One 4 mm polyp at ileocecal valve, otherwise normal exam.  Patient was heme-negative.  Pathology with tubular adenoma.  No recommendations to repeat colonoscopy.   DILATION AND CURETTAGE OF UTERUS     ESOPHAGEAL DILATION  04/07/2020   Procedure: ESOPHAGEAL DILATION;  Surgeon: Shaaron Lamar HERO, MD;  [facility];  Service: Endoscopy;;   ESOPHAGOGASTRODUODENOSCOPY (EGD) WITH PROPOFOL  N/A 04/07/2020   Procedure: ESOPHAGOGASTRODUODENOSCOPY (EGD) WITH PROPOFOL ;  Surgeon: Shaaron Lamar HERO, MD;  Small Schatzki's ring s/p dilated, small hiatal hernia, otherwise normal exam.   POLYPECTOMY  04/07/2020   Procedure: POLYPECTOMY;  Surgeon: Shaaron Lamar HERO, MD;  [facility]: [facility];  Service: Endoscopy;;   Family History  Problem Relation Age of Onset   Heart disease Mother    Hypertension Mother    Birth defects Daughter    Colon cancer Neg Hx    Liver disease Neg Hx    Colon polyps Neg Hx    Social History   Socioeconomic History   Marital status: Married    Spouse name: Therapist, nutritional   Number of children: 4   Years of education: Not on file   Highest education level: 12th grade  Occupational History   Not on file   Tobacco Use   Smoking status: Never   Smokeless tobacco: Never  Substance and Sexual Activity   Alcohol use: No    Alcohol/week: 0.0 standard drinks of alcohol   Drug use: No   Sexual activity: Yes    Birth control/protection: Surgical    Comment: hyst  Other Topics Concern   Not on file  Social History Narrative   Total of 4 kids between pt and husband. 3 living.    Married x 28 years.    Social Drivers of Corporate investment banker Strain: Low Risk  (07/24/2024)   Overall Financial Resource Strain (CARDIA)    Difficulty of Paying Living Expenses: Not very hard  Food Insecurity: No Food Insecurity (07/24/2024)   Hunger Vital Sign    Worried About Running Out of Food in the Last Year: Never true    Ran Out of Food in the Last Year: Never true  Transportation Needs: No Transportation Needs (07/24/2024)  PRAPARE - Administrator, Civil Service (Medical): No    Lack of Transportation (Non-Medical): No  Physical Activity: Inactive (07/24/2024)   Exercise Vital Sign    Days of Exercise per Week: 0 days    Minutes of Exercise per Session: 0 min  Stress: No Stress Concern Present (07/24/2024)   [employer] of Occupational Health - Occupational Stress Questionnaire    Feeling of Stress: Only a little  Social Connections: Socially Integrated (07/24/2024)   Social Connection and Isolation Panel    Frequency of Communication with Friends and Family: Twice a week    Frequency of Social Gatherings with Friends and Family: Three times a week    Attends Religious Services: More than 4 times per year    Active Member of Clubs or Organizations: Yes    Attends Engineer, structural: More than 4 times per year    Marital Status: Married    Tobacco Counseling Counseling given: Not Answered    Clinical Intake:  Pre-visit preparation completed: Yes  Pain : No/denies pain  Diabetes: Yes CBG done?: No Did pt. bring in CBG monitor from home?: No  Lab  Results  Component Value Date   HGBA1C 7.5 (H) 05/08/2024   HGBA1C 7.1 (H) 12/06/2023   HGBA1C 7.8 (H) 06/20/2023     How often do you need to have someone help you when you read instructions, pamphlets, or other written materials from your doctor or pharmacy?: 1 - Never  Interpreter Needed?: No  Information entered by :: Charmaine Bloodgood LPN   Activities of Daily Living     07/24/2024   11:26 AM  In your present state of health, do you have any difficulty performing the following activities:  Hearing? 0  Vision? 0  Difficulty concentrating or making decisions? 0  Walking or climbing stairs? 0  Dressing or bathing? 0  Doing errands, shopping? 0  Preparing Food and eating ? N  Using the Toilet? N  In the past six months, have you accidently leaked urine? N  Do you have problems with loss of bowel control? N  Managing your Medications? N  Managing your Finances? N  Housekeeping or managing your Housekeeping? N    Patient Care Team: Alphonsa Glendia LABOR, MD as PCP - General (Family Medicine) Stacia Diannah SQUIBB, MD as PCP - Cardiology (Cardiology) Shaaron Lamar HERO, MD as Consulting Physician (Gastroenterology) Marilynn Nest, DO as Consulting Physician (Obstetrics and Gynecology) Ladora Ross Lacy Phebe, MD as Referring Physician (Optometry) Vicci Mcardle, OD (Optometry)  I have updated your Care Teams any recent Medical Services you may have received from other providers in the past year.     Assessment:   This is a routine wellness examination for Zakariah.  Hearing/Vision screen Hearing Screening - Comments:: Patient is able to hear conversational tones without difficulty. No issues reported.   Vision Screening - Comments:: Wears rx glasses - up to date with routine eye exams with [facility]     Goals Addressed             This Visit's Progress    DIET - EAT MORE FRUITS AND VEGETABLES   On track      Depression Screen     07/24/2024   11:23 AM 05/13/2024     1:26 PM 03/23/2024   10:26 AM 03/20/2024   10:06 AM 12/20/2023    4:13 PM 12/09/2023    2:08 PM 07/19/2023   10:12 AM  PHQ 2/9 Scores  PHQ - 2 Score 5 5 1 2 2 3  0  PHQ- 9 Score 14 14 5 6 6 7  0    Fall Risk     07/24/2024   11:26 AM 05/13/2024    1:26 PM 03/20/2024   10:06 AM 12/20/2023    4:13 PM 12/09/2023    2:08 PM  Fall Risk   Falls in the past year? 0 0 0 0 0  Number falls in past yr: 0      Injury with Fall? 0      Risk for fall due to : No Fall Risks      Follow up Falls prevention discussed;Education provided;Falls evaluation completed        MEDICARE RISK AT HOME:  Medicare Risk at Home Any stairs in or around the home?: No If so, are there any without handrails?: No Home free of loose throw rugs in walkways, pet beds, electrical cords, etc?: Yes Adequate lighting in your home to reduce risk of falls?: Yes Life alert?: No Use of a cane, walker or w/c?: No Grab bars in the bathroom?: Yes Shower chair or bench in shower?: No Elevated toilet seat or a handicapped toilet?: Yes  TIMED UP AND GO:  Was the test performed?  No  Cognitive Function: 6CIT completed        07/24/2024   11:26 AM 07/19/2023   10:14 AM 07/10/2022    3:09 PM 07/04/2021    4:07 PM  6CIT Screen  What Year? 0 points 0 points 0 points 0 points  What month? 0 points 0 points 0 points 0 points  What time? 0 points 0 points 0 points 0 points  Count back from 20 0 points 0 points 0 points 0 points  Months in reverse 0 points 0 points 0 points 0 points  Repeat phrase 0 points 0 points 0 points 0 points  Total Score 0 points 0 points 0 points 0 points    Immunizations Immunization History  Administered Date(s) Administered   Fluad Quad(high Dose 65+) 08/11/2020, 08/11/2021, 08/08/2022   Fluad Trivalent(High Dose 65+) 07/03/2023   INFLUENZA, HIGH DOSE SEASONAL PF 08/13/2019   Influenza Split 07/16/2015   Influenza,inj,Quad PF,6+ Mos 08/08/2018   Influenza-Unspecified 1Aug 31, 1952, 08/03/2016,  08/13/2019   Moderna Sars-Covid-2 Vaccination 02/12/2020, 03/11/2020   Pneumococcal Conjugate-13 09/16/2015   Pneumococcal Polysaccharide-23 09/20/2016   Zoster, Live 10/15/2013    Screening Tests Health Maintenance  Topic Date Due   DTaP/Tdap/Td (1 - Tdap) Never done   Zoster Vaccines- Shingrix (1 of 2) 10/17/1968   COVID-19 Vaccine (3 - Moderna risk series) 04/08/2020   Influenza Vaccine  05/15/2024   Diabetic kidney evaluation - Urine ACR  06/19/2024   HEMOGLOBIN A1C  11/08/2024   OPHTHALMOLOGY EXAM  02/03/2025   Diabetic kidney evaluation - eGFR measurement  05/08/2025   FOOT EXAM  05/13/2025   Medicare Annual Wellness (AWV)  07/24/2025   Mammogram  09/03/2025   Colonoscopy  04/07/2030   Pneumococcal Vaccine: 50+ Years  Completed   DEXA SCAN  Completed   Hepatitis C Screening  Completed   Meningococcal B Vaccine  Aged Out    Health Maintenance Items Addressed: Vaccines Due: Flu, Shingrix, and Tdap   Additional Screening:  Vision Screening: Recommended annual ophthalmology exams for early detection of glaucoma and other disorders of the eye. Is the patient up to date with their annual eye exam?  Yes  Who is the provider or what is the name of the office in  which the patient attends annual eye exams? [facility]   Dental Screening: Recommended annual dental exams for proper oral hygiene  Community Resource Referral / Chronic Care Management: CRR required this visit?  No   CCM required this visit?  No   Plan:    I have personally reviewed and noted the following in the patient's chart:   Medical and social history Use of alcohol, tobacco or illicit drugs  Current medications and supplements including opioid prescriptions. Patient is not currently taking opioid prescriptions. Functional ability and status Nutritional status Physical activity Advanced directives List of other physicians Hospitalizations, surgeries, and ER visits in previous 12  months Vitals Screenings to include cognitive, depression, and falls Referrals and appointments  In addition, I have reviewed and discussed with patient certain preventive protocols, quality metrics, and best practice recommendations. A written personalized care plan for preventive services as well as general preventive health recommendations were provided to patient.   Lavelle Pfeiffer [place], [place]   [id number]   After Visit Summary: (MyChart) Due to this being a telephonic visit, the after visit summary with patients personalized plan was offered to patient via MyChart   Notes: Nothing significant to report at this time.

## 2024-07-24 NOTE — Patient Instructions (Signed)
 Nicole Lam,  Thank you for taking the time for your Medicare Wellness Visit. I appreciate your continued commitment to your health goals. Please review the care plan we discussed, and feel free to reach out if I can assist you further.  Medicare recommends these wellness visits once per year to help you and your care team stay ahead of potential health issues. These visits are designed to focus on prevention, allowing your provider to concentrate on managing your acute and chronic conditions during your regular appointments.  Please note that Annual Wellness Visits do not include a physical exam. Some assessments may be limited, especially if the visit was conducted virtually. If needed, we may recommend a separate in-person follow-up with your provider.  Ongoing Care Seeing your primary care provider every 3 to 6 months helps us  monitor your health and provide consistent, personalized care.   Referrals If a referral was made during today's visit and you haven't received any updates within two weeks, please contact the referred provider directly to check on the status.  Recommended Screenings:  Health Maintenance  Topic Date Due   DTaP/Tdap/Td vaccine (1 - Tdap) Never done   Zoster (Shingles) Vaccine (1 of 2) 10/17/1968   COVID-19 Vaccine (3 - Moderna risk series) 04/08/2020   Flu Shot  05/15/2024   Yearly kidney health urinalysis for diabetes  06/19/2024   Hemoglobin A1C  11/08/2024   Eye exam for diabetics  02/03/2025   Yearly kidney function blood test for diabetes  05/08/2025   Complete foot exam   05/13/2025   Medicare Annual Wellness Visit  07/24/2025   Breast Cancer Screening  09/03/2025   Colon Cancer Screening  04/07/2030   Pneumococcal Vaccine for age over 74  Completed   DEXA scan (bone density measurement)  Completed   Hepatitis C Screening  Completed   Meningitis B Vaccine  Aged Out       07/24/2024   11:26 AM  Advanced Directives  Does Patient Have a Medical  Advance Directive? No  Would patient like information on creating a medical advance directive? Yes (MAU/Ambulatory/Procedural Areas - Information given)   Advance Care Planning is important because it: Ensures you receive medical care that aligns with your values, goals, and preferences. Provides guidance to your family and loved ones, reducing the emotional burden of decision-making during critical moments.  Information on Advanced Care Planning can be found at [place]  Secretary of [facility] Advance Health Care Directives Advance Health Care Directives ([email or web address]   Vision: Annual vision screenings are recommended for early detection of glaucoma, cataracts, and diabetic retinopathy. These exams can also reveal signs of chronic conditions such as diabetes and high blood pressure.  Dental: Annual dental screenings help detect early signs of oral cancer, gum disease, and other conditions linked to overall health, including heart disease and diabetes.  Please see the attached documents for additional preventive care recommendations.

## 2024-08-11 ENCOUNTER — Other Ambulatory Visit (HOSPITAL_COMMUNITY): Payer: Self-pay | Admitting: Family Medicine

## 2024-08-11 DIAGNOSIS — Z1231 Encounter for screening mammogram for malignant neoplasm of breast: Secondary | ICD-10-CM

## 2024-08-27 DIAGNOSIS — A6009 Herpesviral infection of other urogenital tract: Secondary | ICD-10-CM | POA: Diagnosis not present

## 2024-08-27 DIAGNOSIS — K219 Gastro-esophageal reflux disease without esophagitis: Secondary | ICD-10-CM | POA: Diagnosis not present

## 2024-08-27 DIAGNOSIS — E039 Hypothyroidism, unspecified: Secondary | ICD-10-CM | POA: Diagnosis not present

## 2024-08-27 DIAGNOSIS — M1990 Unspecified osteoarthritis, unspecified site: Secondary | ICD-10-CM | POA: Diagnosis not present

## 2024-08-27 DIAGNOSIS — Z791 Long term (current) use of non-steroidal anti-inflammatories (NSAID): Secondary | ICD-10-CM | POA: Diagnosis not present

## 2024-08-27 DIAGNOSIS — F4322 Adjustment disorder with anxiety: Secondary | ICD-10-CM | POA: Diagnosis not present

## 2024-08-27 DIAGNOSIS — I10 Essential (primary) hypertension: Secondary | ICD-10-CM | POA: Diagnosis not present

## 2024-08-27 DIAGNOSIS — R45851 Suicidal ideations: Secondary | ICD-10-CM | POA: Diagnosis not present

## 2024-08-27 DIAGNOSIS — Z823 Family history of stroke: Secondary | ICD-10-CM | POA: Diagnosis not present

## 2024-08-27 DIAGNOSIS — K08409 Partial loss of teeth, unspecified cause, unspecified class: Secondary | ICD-10-CM | POA: Diagnosis not present

## 2024-08-27 DIAGNOSIS — F321 Major depressive disorder, single episode, moderate: Secondary | ICD-10-CM | POA: Diagnosis not present

## 2024-08-27 DIAGNOSIS — E11630 Type 2 diabetes mellitus with periodontal disease: Secondary | ICD-10-CM | POA: Diagnosis not present

## 2024-08-27 DIAGNOSIS — K760 Fatty (change of) liver, not elsewhere classified: Secondary | ICD-10-CM | POA: Diagnosis not present

## 2024-08-27 DIAGNOSIS — E11620 Type 2 diabetes mellitus with diabetic dermatitis: Secondary | ICD-10-CM | POA: Diagnosis not present

## 2024-08-27 DIAGNOSIS — Z818 Family history of other mental and behavioral disorders: Secondary | ICD-10-CM | POA: Diagnosis not present

## 2024-08-27 DIAGNOSIS — E1142 Type 2 diabetes mellitus with diabetic polyneuropathy: Secondary | ICD-10-CM | POA: Diagnosis not present

## 2024-08-27 DIAGNOSIS — R32 Unspecified urinary incontinence: Secondary | ICD-10-CM | POA: Diagnosis not present

## 2024-08-27 DIAGNOSIS — E785 Hyperlipidemia, unspecified: Secondary | ICD-10-CM | POA: Diagnosis not present

## 2024-09-07 ENCOUNTER — Encounter (HOSPITAL_COMMUNITY): Payer: Self-pay

## 2024-09-07 ENCOUNTER — Ambulatory Visit (HOSPITAL_COMMUNITY)
Admission: RE | Admit: 2024-09-07 | Discharge: 2024-09-07 | Disposition: A | Discharge status: Home / Self Care | Source: Ambulatory Visit | Attending: Family Medicine | Admitting: Family Medicine

## 2024-09-07 DIAGNOSIS — Z1231 Encounter for screening mammogram for malignant neoplasm of breast: Secondary | ICD-10-CM | POA: Diagnosis not present

## 2024-10-02 ENCOUNTER — Other Ambulatory Visit: Payer: Self-pay | Admitting: Family Medicine

## 2024-10-02 NOTE — Telephone Encounter (Unsigned)
 Copied from CRM #[id number]. Topic: Clinical - Medication Refill >> Oct 02, 2024  4:11 PM Fonda T wrote: Medication: HYDROcodone -acetaminophen  (NORCO) 7.5-325 MG tablet  Has the patient contacted their pharmacy? Yes, advised to contact office   This is the patient's preferred pharmacy:   [facility] [address], [place] - [address] [place] [address] Phone: [phone] Fax: [phone]   Is this the correct pharmacy for this prescription? Yes If no, delete pharmacy and type the correct one.   Has the prescription been filled recently? Yes  Is the patient out of the medication? Yes  Has the patient been seen for an appointment in the last year OR does the patient have an upcoming appointment? Yes  Can we respond through MyChart? No, prefers call to cell [phone]  Agent: Please be advised that Rx refills may take up to 3 business days. We ask that you follow-up with your pharmacy.

## 2024-10-05 MED ORDER — HYDROCODONE-ACETAMINOPHEN 7.5-325 MG PO TABS: ORAL_TABLET | ORAL | 0 refills | Status: DC

## 2024-10-19 ENCOUNTER — Other Ambulatory Visit: Payer: Self-pay | Admitting: Family Medicine

## 2024-10-19 MED ORDER — TRUE METRIX BLOOD GLUCOSE TEST VI STRP: ORAL_STRIP | 3 refills | Status: AC

## 2024-10-19 NOTE — Telephone Encounter (Signed)
 Copied from CRM [id number]. Topic: Clinical - Medication Refill >> Oct 19, 2024 10:40 AM Emylou G wrote: Medication: TRUE METRIX BLOOD GLUCOSE TEST test strip  Has the patient contacted their pharmacy? Yes (Agent: If no, request that the patient contact the pharmacy for the refill. If patient does not wish to contact the pharmacy document the reason why and proceed with request.) (Agent: If yes, when and what did the pharmacy advise?) said to call us   This is the patient's preferred pharmacy:  [facility] - [place], [place] - [address] [place] [address] Phone: [phone] Fax: [phone]   Is this the correct pharmacy for this prescription? Yes If no, delete pharmacy and type the correct one.   Has the prescription been filled recently? No  Is the patient out of the medication? Yes  Has the patient been seen for an appointment in the last year OR does the patient have an upcoming appointment? Yes  Can we respond through MyChart? No  Agent: Please be advised that Rx refills may take up to 3 business days. We ask that you follow-up with your pharmacy. >> Oct 19, 2024 10:43 AM Emylou G wrote: Please send to local instead!! [facility] [address] [place] - [address] [place] [address]  Phone: [phone] Fax: [phone]  Hours: Not open 24 hours

## 2024-10-19 NOTE — Telephone Encounter (Signed)
 Copied from CRM [id number]. Topic: Clinical - Medication Refill >> Oct 19, 2024 10:40 AM Emylou G wrote: Medication: TRUE METRIX BLOOD GLUCOSE TEST test strip  Has the patient contacted their pharmacy? Yes (Agent: If no, request that the patient contact the pharmacy for the refill. If patient does not wish to contact the pharmacy document the reason why and proceed with request.) (Agent: If yes, when and what did the pharmacy advise?) said to call us   This is the patient's preferred pharmacy:  [facility] - [place], [place] - [address] [place] [address] Phone: [phone] Fax: [phone]   Is this the correct pharmacy for this prescription? Yes If no, delete pharmacy and type the correct one.   Has the prescription been filled recently? No  Is the patient out of the medication? Yes  Has the patient been seen for an appointment in the last year OR does the patient have an upcoming appointment? Yes  Can we respond through MyChart? No  Agent: Please be advised that Rx refills may take up to 3 business days. We ask that you follow-up with your pharmacy.

## 2024-10-21 ENCOUNTER — Telehealth: Payer: Self-pay

## 2024-10-21 NOTE — Telephone Encounter (Signed)
---   spoke with patient and she has been checking her blood sugars in the morning ranges 150's to 200's in the morning, pt would like to have labs ordered for her feb 2026 appt. She has recently received new test strips for her 2022 meter, she would like to continue using the meter, will let us  know if needs anything else---  Copied from CRM #[id number]. Topic: Clinical - Medication Question >> Oct 19, 2024 10:45 AM Emylou G wrote: Reason for CRM: Please call patient.. needs clarity how to use her test strips and solution

## 2024-10-22 ENCOUNTER — Other Ambulatory Visit: Payer: Self-pay | Admitting: Family Medicine

## 2024-10-22 NOTE — Telephone Encounter (Signed)
 Her labs are in the system She can go to [facility] anywhere this week or next week or early the following week to get these completed She should reference the lab orders that we put into the system back in July  Also work with the patient regarding what is going on with her strips and help her accordingly thank you

## 2024-10-23 ENCOUNTER — Ambulatory Visit: Admitting: Gastroenterology

## 2024-10-26 ENCOUNTER — Other Ambulatory Visit: Payer: Self-pay

## 2024-10-26 DIAGNOSIS — Z79899 Other long term (current) drug therapy: Secondary | ICD-10-CM

## 2024-10-26 DIAGNOSIS — E119 Type 2 diabetes mellitus without complications: Secondary | ICD-10-CM

## 2024-10-26 DIAGNOSIS — I10 Essential (primary) hypertension: Secondary | ICD-10-CM

## 2024-10-26 DIAGNOSIS — E1169 Type 2 diabetes mellitus with other specified complication: Secondary | ICD-10-CM

## 2024-10-26 DIAGNOSIS — E039 Hypothyroidism, unspecified: Secondary | ICD-10-CM

## 2024-10-26 NOTE — Telephone Encounter (Signed)
 Patient has been informed her labs are in the system

## 2024-10-30 LAB — LIPID PANEL
Chol/HDL Ratio: 4.5 ratio — ABNORMAL HIGH (ref 0.0–4.4)
Cholesterol, Total: 179 mg/dL (ref 100–199)
HDL: 40 mg/dL
LDL Chol Calc (NIH): 111 mg/dL — ABNORMAL HIGH (ref 0–99)
Triglycerides: 157 mg/dL — ABNORMAL HIGH (ref 0–149)
VLDL Cholesterol Cal: 28 mg/dL (ref 5–40)

## 2024-10-30 LAB — TSH+FREE T4
Free T4: 1.51 ng/dL (ref 0.82–1.77)
TSH: 0.044 u[IU]/mL — ABNORMAL LOW (ref 0.450–4.500)

## 2024-10-30 LAB — BASIC METABOLIC PANEL WITH GFR
BUN/Creatinine Ratio: 12 (ref 12–28)
BUN: 12 mg/dL (ref 8–27)
CO2: 23 mmol/L (ref 20–29)
Calcium: 10.1 mg/dL (ref 8.7–10.3)
Chloride: 98 mmol/L (ref 96–106)
Creatinine, Ser: 0.97 mg/dL (ref 0.57–1.00)
Glucose: 161 mg/dL — ABNORMAL HIGH (ref 70–99)
Potassium: 4.6 mmol/L (ref 3.5–5.2)
Sodium: 139 mmol/L (ref 134–144)
eGFR: 61 mL/min/1.73

## 2024-10-30 LAB — HEMOGLOBIN A1C
Est. average glucose Bld gHb Est-mCnc: 174 mg/dL
Hgb A1c MFr Bld: 7.7 % — ABNORMAL HIGH (ref 4.8–5.6)

## 2024-10-30 LAB — MICROALBUMIN / CREATININE URINE RATIO
Creatinine, Urine: 89.6 mg/dL
Microalb/Creat Ratio: 5 mg/g{creat} (ref 0–29)
Microalbumin, Urine: 4.5 ug/mL

## 2024-11-01 ENCOUNTER — Ambulatory Visit: Payer: Self-pay | Admitting: Family Medicine

## 2024-11-18 ENCOUNTER — Telehealth: Payer: Self-pay

## 2024-11-18 ENCOUNTER — Ambulatory Visit: Admitting: Family Medicine

## 2024-11-18 ENCOUNTER — Telehealth: Payer: Self-pay | Admitting: *Deleted

## 2024-11-18 VITALS — BP 114/65 | HR 107 | Temp 97.9°F | Ht 60.0 in | Wt 119.0 lb

## 2024-11-18 DIAGNOSIS — E039 Hypothyroidism, unspecified: Secondary | ICD-10-CM

## 2024-11-18 DIAGNOSIS — E1169 Type 2 diabetes mellitus with other specified complication: Secondary | ICD-10-CM

## 2024-11-18 DIAGNOSIS — I10 Essential (primary) hypertension: Secondary | ICD-10-CM

## 2024-11-18 DIAGNOSIS — G720 Drug-induced myopathy: Secondary | ICD-10-CM | POA: Diagnosis not present

## 2024-11-18 DIAGNOSIS — E119 Type 2 diabetes mellitus without complications: Secondary | ICD-10-CM | POA: Diagnosis not present

## 2024-11-18 DIAGNOSIS — T466X5D Adverse effect of antihyperlipidemic and antiarteriosclerotic drugs, subsequent encounter: Secondary | ICD-10-CM | POA: Diagnosis not present

## 2024-11-18 DIAGNOSIS — F321 Major depressive disorder, single episode, moderate: Secondary | ICD-10-CM

## 2024-11-18 DIAGNOSIS — E785 Hyperlipidemia, unspecified: Secondary | ICD-10-CM | POA: Diagnosis not present

## 2024-11-18 MED ORDER — FLUOXETINE HCL 10 MG PO CAPS: 10.0000 mg | ORAL_CAPSULE | Freq: Every day | ORAL | 4 refills | Status: AC

## 2024-11-18 MED ORDER — GLIPIZIDE ER 5 MG PO TB24: ORAL_TABLET | ORAL | 1 refills | Status: AC

## 2024-11-18 MED ORDER — LEVOTHYROXINE SODIUM 88 MCG PO TABS: ORAL_TABLET | ORAL | 3 refills | Status: AC

## 2024-11-18 MED ORDER — HYDROCODONE-ACETAMINOPHEN 7.5-325 MG PO TABS: ORAL_TABLET | ORAL | 0 refills | Status: AC

## 2024-11-18 NOTE — Progress Notes (Signed)
 "  Subjective:    Patient ID: Nicole Lam, female    DOB: 11-04-1950, 74 y.o.   MRN: [id number]  HPI  Room 10  Pt is here for a 6 month follow up  Pt states she is feeling more depressed after husband passing - worrying about kids Results for orders placed or performed in visit on 10/26/24  Lipid Panel   Collection Time: 10/29/24 11:34 AM  Result Value Ref Range   Cholesterol, Total 179 100 - 199 mg/dL   Triglycerides 842 (H) 0 - 149 mg/dL   HDL 40 >60 mg/dL   VLDL Cholesterol Cal 28 5 - 40 mg/dL   LDL Chol Calc (NIH) 888 (H) 0 - 99 mg/dL   Chol/HDL Ratio 4.5 (H) 0.0 - 4.4 ratio  Hemoglobin A1c   Collection Time: 10/29/24 11:34 AM  Result Value Ref Range   Hgb A1c MFr Bld 7.7 (H) 4.8 - 5.6 %   Est. average glucose Bld gHb Est-mCnc 174 mg/dL  TSH + free T4   Collection Time: 10/29/24 11:34 AM  Result Value Ref Range   TSH 0.044 (L) 0.450 - 4.500 uIU/mL   Free T4 1.51 0.82 - 1.77 ng/dL  Basic Metabolic Panel   Collection Time: 10/29/24 11:34 AM  Result Value Ref Range   Glucose 161 (H) 70 - 99 mg/dL   BUN 12 8 - 27 mg/dL   Creatinine, Ser 9.02 0.57 - 1.00 mg/dL   eGFR 61 >40 fO/fpw/8.26   BUN/Creatinine Ratio 12 12 - 28   Sodium 139 134 - 144 mmol/L   Potassium 4.6 3.5 - 5.2 mmol/L   Chloride 98 96 - 106 mmol/L   CO2 23 20 - 29 mmol/L   Calcium  10.1 8.7 - 10.3 mg/dL  Microalbumin/Creatinine Ratio, Urine   Collection Time: 10/29/24 11:34 AM  Result Value Ref Range   Creatinine, Urine 89.6 Not Estab. mg/dL   Microalbumin, Urine 4.5 Not Estab. ug/mL   Microalb/Creat Ratio 5 0 - 29 mg/g creat   Patient having very difficult time She finds herself feeling depressed about the loss of her husband Some days she is quite depressed on the days not as bad She denies being suicidal After shared discussion she stated that she thinks she would benefit from medication  She did have a recent labs and reviewed those in detail I recommend some adjustment on her thyroid   medicine to 1/2 tablet 4 days a week Prediabetes fair control we did discuss healthy diet she will continue her current medications on a regular basis She will do lab work before her next visit She does have a history of significant liver disease.  She is seeing gastroenterology coming up Previous liver enzymes look good I am hesitant to place her on any type of statin currently she has not tolerated statins in the past She cannot afford Repatha currently Review of Systems     Objective:   Physical Exam General-in no acute distress Eyes-no discharge Lungs-respiratory rate normal, CTA CV-no murmurs,RRR Extremities skin warm dry no edema Neuro grossly normal Behavior normal, alert        Assessment & Plan:   1. Type 2 diabetes mellitus without complication, without long-term current use of insulin (HCC) (Primary) A1c fair control healthy diet continue current meds recheck labs again in approximately 6 months  2. Primary hypertension Blood pressure good control continue current measures  3. Hyperlipidemia associated with type 2 diabetes mellitus (HCC) LDL fairly good control given that she is not  on a statin cannot afford Repatha  4. Hypothyroidism, unspecified type Adjust thyroid  medicine Recheck labs before next visit  5. Statin myopathy Not a candidate for statins  6. Depression, major, single episode, moderate (HCC) Recommend Prozac  10 mg daily patient to give us  feedback on how things go we will connect with her in 1 month follow-up office visit by summertime  "

## 2024-11-18 NOTE — Telephone Encounter (Signed)
 Pt's was approved for Rezdiffra 

## 2024-11-18 NOTE — Patient Instructions (Signed)
 Hi Nicole Lam  It was good to see you today I will send in the antidepressant to your pharmacy I also sent in a refill on your hydrocodone  I will reach out to you in about a month to see how you are doing if you need anything before then let me know Remember your thyroid  medicine-half tablet Monday Wednesday Friday and Saturday and a full tablet on all the other days  We will see you come summertime sooner if any problems Please hang in there the best you can Take care-Dr. Glendia

## 2024-11-18 NOTE — Telephone Encounter (Signed)
 PA done for Rezdiffra  80 mg tab. Dx used:K75.81( [name]). Pt has been approved and will be scanned to the pt's chart

## 2024-11-19 ENCOUNTER — Other Ambulatory Visit: Payer: Self-pay | Admitting: *Deleted

## 2024-11-20 ENCOUNTER — Other Ambulatory Visit: Payer: Self-pay | Admitting: *Deleted

## 2024-11-20 DIAGNOSIS — K7581 Nonalcoholic steatohepatitis (NASH): Secondary | ICD-10-CM

## 2024-11-20 NOTE — Telephone Encounter (Signed)
 Spoke to pt, informed her of recommendations. Pt voiced understanding. Labs entered into Epic. Informed to have labs done next week fasting.

## 2024-11-20 NOTE — Progress Notes (Signed)
 error

## 2024-11-30 ENCOUNTER — Ambulatory Visit: Admitting: Gastroenterology

## 2025-04-23 ENCOUNTER — Ambulatory Visit: Admitting: Family Medicine

## 2025-07-30 ENCOUNTER — Ambulatory Visit
